# Patient Record
Sex: Male | Born: 1935 | Race: White | Hispanic: No | Marital: Married | Smoking: Former smoker
Health system: Southern US, Community
[De-identification: ages and names within clinical notes are randomized; demographics above are authoritative.]

## PROBLEM LIST (undated history)

## (undated) DIAGNOSIS — K219 Gastro-esophageal reflux disease without esophagitis: Secondary | ICD-10-CM

## (undated) DIAGNOSIS — I5189 Other ill-defined heart diseases: Secondary | ICD-10-CM

## (undated) DIAGNOSIS — R0602 Shortness of breath: Secondary | ICD-10-CM

## (undated) DIAGNOSIS — K449 Diaphragmatic hernia without obstruction or gangrene: Secondary | ICD-10-CM

## (undated) DIAGNOSIS — I2510 Atherosclerotic heart disease of native coronary artery without angina pectoris: Secondary | ICD-10-CM

## (undated) DIAGNOSIS — E230 Hypopituitarism: Secondary | ICD-10-CM

## (undated) DIAGNOSIS — E039 Hypothyroidism, unspecified: Secondary | ICD-10-CM

## (undated) DIAGNOSIS — I509 Heart failure, unspecified: Secondary | ICD-10-CM

## (undated) DIAGNOSIS — M1990 Unspecified osteoarthritis, unspecified site: Secondary | ICD-10-CM

## (undated) DIAGNOSIS — K639 Disease of intestine, unspecified: Secondary | ICD-10-CM

## (undated) DIAGNOSIS — E6609 Other obesity due to excess calories: Secondary | ICD-10-CM

## (undated) DIAGNOSIS — K5792 Diverticulitis of intestine, part unspecified, without perforation or abscess without bleeding: Secondary | ICD-10-CM

## (undated) DIAGNOSIS — Z87898 Personal history of other specified conditions: Secondary | ICD-10-CM

## (undated) DIAGNOSIS — D696 Thrombocytopenia, unspecified: Secondary | ICD-10-CM

## (undated) DIAGNOSIS — N201 Calculus of ureter: Secondary | ICD-10-CM

## (undated) DIAGNOSIS — Z86010 Personal history of colonic polyps: Secondary | ICD-10-CM

## (undated) DIAGNOSIS — K760 Fatty (change of) liver, not elsewhere classified: Secondary | ICD-10-CM

## (undated) DIAGNOSIS — N200 Calculus of kidney: Secondary | ICD-10-CM

## (undated) HISTORY — DX: Unspecified osteoarthritis, unspecified site: M19.90

## (undated) HISTORY — DX: Personal history of colonic polyps: Z86.010

## (undated) HISTORY — DX: Calculus of kidney: N20.0

## (undated) HISTORY — DX: Heart failure, unspecified: I50.9

## (undated) HISTORY — DX: Other obesity due to excess calories: E66.09

## (undated) HISTORY — DX: Diverticulitis of intestine, part unspecified, without perforation or abscess without bleeding: K57.92

## (undated) HISTORY — DX: Atherosclerotic heart disease of native coronary artery without angina pectoris: I25.10

## (undated) HISTORY — DX: Diaphragmatic hernia without obstruction or gangrene: K44.9

## (undated) HISTORY — DX: Hypothyroidism, unspecified: E03.9

## (undated) HISTORY — DX: Personal history of other specified conditions: Z87.898

## (undated) HISTORY — DX: Gastro-esophageal reflux disease without esophagitis: K21.9

## (undated) HISTORY — DX: Disease of intestine, unspecified: K63.9

## (undated) HISTORY — DX: Fatty (change of) liver, not elsewhere classified: K76.0

## (undated) HISTORY — DX: Hypopituitarism: E23.0

## (undated) HISTORY — DX: Other ill-defined heart diseases: I51.89

## (undated) HISTORY — DX: Calculus of ureter: N20.1

---

## 1997-10-02 ENCOUNTER — Inpatient Hospital Stay (HOSPITAL_COMMUNITY): Admission: RE | Admit: 1997-10-02 | Discharge: 1997-10-14 | Payer: Self-pay | Admitting: Neurosurgery

## 1997-10-02 ENCOUNTER — Encounter: Payer: Self-pay | Admitting: Neurosurgery

## 1997-10-03 ENCOUNTER — Encounter: Payer: Self-pay | Admitting: Neurosurgery

## 1997-10-04 ENCOUNTER — Encounter: Payer: Self-pay | Admitting: Neurosurgery

## 1997-10-05 ENCOUNTER — Encounter: Payer: Self-pay | Admitting: Neurosurgery

## 1997-10-07 ENCOUNTER — Encounter: Payer: Self-pay | Admitting: Neurosurgery

## 1997-10-08 ENCOUNTER — Encounter: Payer: Self-pay | Admitting: Neurosurgery

## 1997-10-09 ENCOUNTER — Encounter: Payer: Self-pay | Admitting: Neurosurgery

## 1997-11-01 ENCOUNTER — Ambulatory Visit (HOSPITAL_COMMUNITY): Admission: RE | Admit: 1997-11-01 | Discharge: 1997-11-01 | Payer: Self-pay | Admitting: Internal Medicine

## 1997-11-13 ENCOUNTER — Ambulatory Visit (HOSPITAL_COMMUNITY): Admission: RE | Admit: 1997-11-13 | Discharge: 1997-11-13 | Payer: Self-pay | Admitting: Internal Medicine

## 1997-11-13 ENCOUNTER — Encounter (HOSPITAL_BASED_OUTPATIENT_CLINIC_OR_DEPARTMENT_OTHER): Payer: Self-pay | Admitting: Internal Medicine

## 1997-12-16 ENCOUNTER — Ambulatory Visit (HOSPITAL_COMMUNITY): Admission: RE | Admit: 1997-12-16 | Discharge: 1997-12-16 | Payer: Self-pay | Admitting: Internal Medicine

## 1997-12-16 ENCOUNTER — Encounter (HOSPITAL_BASED_OUTPATIENT_CLINIC_OR_DEPARTMENT_OTHER): Payer: Self-pay | Admitting: Internal Medicine

## 1998-01-13 ENCOUNTER — Ambulatory Visit (HOSPITAL_COMMUNITY): Admission: RE | Admit: 1998-01-13 | Discharge: 1998-01-13 | Payer: Self-pay | Admitting: Neurosurgery

## 1998-01-13 ENCOUNTER — Encounter: Payer: Self-pay | Admitting: Neurosurgery

## 1998-02-17 ENCOUNTER — Ambulatory Visit (HOSPITAL_COMMUNITY): Admission: RE | Admit: 1998-02-17 | Discharge: 1998-02-17 | Payer: Self-pay | Admitting: Internal Medicine

## 1998-03-10 ENCOUNTER — Encounter: Payer: Self-pay | Admitting: Thoracic Surgery

## 1998-03-10 ENCOUNTER — Inpatient Hospital Stay (HOSPITAL_COMMUNITY): Admission: RE | Admit: 1998-03-10 | Discharge: 1998-03-17 | Payer: Self-pay | Admitting: Thoracic Surgery

## 1998-03-11 ENCOUNTER — Encounter: Payer: Self-pay | Admitting: Thoracic Surgery

## 1998-03-12 ENCOUNTER — Encounter: Payer: Self-pay | Admitting: Thoracic Surgery

## 1998-03-13 ENCOUNTER — Encounter: Payer: Self-pay | Admitting: Thoracic Surgery

## 1998-03-14 ENCOUNTER — Encounter: Payer: Self-pay | Admitting: Thoracic Surgery

## 1998-03-15 ENCOUNTER — Encounter: Payer: Self-pay | Admitting: Thoracic Surgery

## 1998-03-17 ENCOUNTER — Encounter: Payer: Self-pay | Admitting: Thoracic Surgery

## 1998-03-21 ENCOUNTER — Encounter: Payer: Self-pay | Admitting: Emergency Medicine

## 1998-03-21 ENCOUNTER — Emergency Department (HOSPITAL_COMMUNITY): Admission: EM | Admit: 1998-03-21 | Discharge: 1998-03-21 | Payer: Self-pay | Admitting: Emergency Medicine

## 1998-05-23 ENCOUNTER — Encounter (HOSPITAL_BASED_OUTPATIENT_CLINIC_OR_DEPARTMENT_OTHER): Payer: Self-pay | Admitting: Internal Medicine

## 1998-05-23 ENCOUNTER — Inpatient Hospital Stay: Admission: EM | Admit: 1998-05-23 | Discharge: 1998-05-27 | Payer: Self-pay | Admitting: Internal Medicine

## 1998-05-25 ENCOUNTER — Encounter: Payer: Self-pay | Admitting: Endocrinology

## 1998-10-07 ENCOUNTER — Emergency Department (HOSPITAL_COMMUNITY): Admission: EM | Admit: 1998-10-07 | Discharge: 1998-10-07 | Payer: Self-pay | Admitting: Emergency Medicine

## 1998-10-07 ENCOUNTER — Encounter: Payer: Self-pay | Admitting: Emergency Medicine

## 1999-01-06 ENCOUNTER — Ambulatory Visit (HOSPITAL_COMMUNITY): Admission: RE | Admit: 1999-01-06 | Discharge: 1999-01-06 | Payer: Self-pay | Admitting: Neurosurgery

## 1999-01-06 ENCOUNTER — Encounter: Payer: Self-pay | Admitting: Neurosurgery

## 1999-01-12 HISTORY — PX: TRANSPHENOIDAL PITUITARY RESECTION: SHX2572

## 1999-01-27 ENCOUNTER — Encounter: Admission: RE | Admit: 1999-01-27 | Discharge: 1999-04-27 | Payer: Self-pay | Admitting: Radiation Oncology

## 1999-04-27 ENCOUNTER — Other Ambulatory Visit: Admission: RE | Admit: 1999-04-27 | Discharge: 1999-04-27 | Payer: Self-pay | Admitting: Gastroenterology

## 1999-04-27 ENCOUNTER — Encounter (INDEPENDENT_AMBULATORY_CARE_PROVIDER_SITE_OTHER): Payer: Self-pay

## 2000-01-19 ENCOUNTER — Ambulatory Visit (HOSPITAL_COMMUNITY): Admission: RE | Admit: 2000-01-19 | Discharge: 2000-01-19 | Payer: Self-pay | Admitting: Neurosurgery

## 2000-01-19 ENCOUNTER — Encounter: Payer: Self-pay | Admitting: Neurosurgery

## 2001-01-27 ENCOUNTER — Ambulatory Visit (HOSPITAL_COMMUNITY): Admission: RE | Admit: 2001-01-27 | Discharge: 2001-01-27 | Payer: Self-pay | Admitting: Neurosurgery

## 2001-01-27 ENCOUNTER — Encounter: Payer: Self-pay | Admitting: Neurosurgery

## 2001-12-04 ENCOUNTER — Emergency Department (HOSPITAL_COMMUNITY): Admission: EM | Admit: 2001-12-04 | Discharge: 2001-12-05 | Payer: Self-pay

## 2001-12-04 ENCOUNTER — Encounter: Payer: Self-pay | Admitting: Emergency Medicine

## 2002-01-11 HISTORY — PX: CHOLECYSTECTOMY, LAPAROSCOPIC: SHX56

## 2002-06-11 ENCOUNTER — Emergency Department (HOSPITAL_COMMUNITY): Admission: EM | Admit: 2002-06-11 | Discharge: 2002-06-11 | Payer: Self-pay | Admitting: Emergency Medicine

## 2002-06-11 ENCOUNTER — Encounter: Payer: Self-pay | Admitting: Emergency Medicine

## 2003-04-23 ENCOUNTER — Ambulatory Visit (HOSPITAL_COMMUNITY): Admission: RE | Admit: 2003-04-23 | Discharge: 2003-04-23 | Payer: Self-pay | Admitting: Internal Medicine

## 2005-06-09 ENCOUNTER — Ambulatory Visit: Payer: Self-pay | Admitting: Gastroenterology

## 2005-06-22 ENCOUNTER — Encounter (INDEPENDENT_AMBULATORY_CARE_PROVIDER_SITE_OTHER): Payer: Self-pay | Admitting: *Deleted

## 2005-06-22 ENCOUNTER — Ambulatory Visit: Payer: Self-pay | Admitting: Gastroenterology

## 2005-06-22 DIAGNOSIS — K648 Other hemorrhoids: Secondary | ICD-10-CM | POA: Insufficient documentation

## 2005-06-22 DIAGNOSIS — D126 Benign neoplasm of colon, unspecified: Secondary | ICD-10-CM

## 2005-06-22 DIAGNOSIS — K5730 Diverticulosis of large intestine without perforation or abscess without bleeding: Secondary | ICD-10-CM | POA: Insufficient documentation

## 2006-12-12 HISTORY — PX: ESOPHAGEAL DILATION: SHX303

## 2006-12-23 ENCOUNTER — Encounter: Payer: Self-pay | Admitting: Gastroenterology

## 2006-12-23 ENCOUNTER — Ambulatory Visit: Payer: Self-pay | Admitting: Gastroenterology

## 2007-01-09 DIAGNOSIS — I428 Other cardiomyopathies: Secondary | ICD-10-CM | POA: Insufficient documentation

## 2007-01-09 DIAGNOSIS — E232 Diabetes insipidus: Secondary | ICD-10-CM

## 2007-01-09 DIAGNOSIS — E230 Hypopituitarism: Secondary | ICD-10-CM | POA: Insufficient documentation

## 2007-01-09 DIAGNOSIS — M129 Arthropathy, unspecified: Secondary | ICD-10-CM | POA: Insufficient documentation

## 2007-01-09 DIAGNOSIS — I509 Heart failure, unspecified: Secondary | ICD-10-CM | POA: Insufficient documentation

## 2008-05-06 ENCOUNTER — Encounter (INDEPENDENT_AMBULATORY_CARE_PROVIDER_SITE_OTHER): Payer: Self-pay | Admitting: *Deleted

## 2008-05-31 ENCOUNTER — Encounter: Payer: Self-pay | Admitting: Gastroenterology

## 2008-06-07 ENCOUNTER — Encounter: Payer: Self-pay | Admitting: Gastroenterology

## 2008-07-02 ENCOUNTER — Ambulatory Visit: Payer: Self-pay | Admitting: Gastroenterology

## 2008-07-02 DIAGNOSIS — R197 Diarrhea, unspecified: Secondary | ICD-10-CM | POA: Insufficient documentation

## 2008-07-02 DIAGNOSIS — R195 Other fecal abnormalities: Secondary | ICD-10-CM

## 2008-07-04 ENCOUNTER — Telehealth: Payer: Self-pay | Admitting: Gastroenterology

## 2008-07-09 ENCOUNTER — Encounter: Admission: RE | Admit: 2008-07-09 | Discharge: 2008-07-09 | Payer: Self-pay | Admitting: Internal Medicine

## 2008-07-22 ENCOUNTER — Encounter: Payer: Self-pay | Admitting: Gastroenterology

## 2008-07-22 ENCOUNTER — Ambulatory Visit: Payer: Self-pay | Admitting: Gastroenterology

## 2008-07-23 DIAGNOSIS — Z860100 Personal history of colon polyps, unspecified: Secondary | ICD-10-CM

## 2008-07-23 HISTORY — DX: Personal history of colonic polyps: Z86.010

## 2008-07-23 HISTORY — DX: Personal history of colon polyps, unspecified: Z86.0100

## 2008-07-24 ENCOUNTER — Encounter: Payer: Self-pay | Admitting: Gastroenterology

## 2008-08-18 ENCOUNTER — Emergency Department (HOSPITAL_COMMUNITY): Admission: EM | Admit: 2008-08-18 | Discharge: 2008-08-18 | Payer: Self-pay | Admitting: Emergency Medicine

## 2009-01-05 ENCOUNTER — Inpatient Hospital Stay (HOSPITAL_COMMUNITY): Admission: EM | Admit: 2009-01-05 | Discharge: 2009-01-07 | Payer: Self-pay | Admitting: Emergency Medicine

## 2009-01-23 ENCOUNTER — Telehealth: Payer: Self-pay | Admitting: Gastroenterology

## 2009-01-28 ENCOUNTER — Ambulatory Visit: Payer: Self-pay | Admitting: Gastroenterology

## 2009-01-28 DIAGNOSIS — R112 Nausea with vomiting, unspecified: Secondary | ICD-10-CM

## 2009-01-28 DIAGNOSIS — R51 Headache: Secondary | ICD-10-CM

## 2009-01-28 DIAGNOSIS — R634 Abnormal weight loss: Secondary | ICD-10-CM

## 2009-01-28 DIAGNOSIS — R519 Headache, unspecified: Secondary | ICD-10-CM | POA: Insufficient documentation

## 2009-01-28 LAB — CONVERTED CEMR LAB
ALT: 30 units/L (ref 0–53)
AST: 34 units/L (ref 0–37)
Albumin: 4 g/dL (ref 3.5–5.2)
Amylase: 77 units/L (ref 27–131)
BUN: 15 mg/dL (ref 6–23)
Basophils Relative: 2.7 % (ref 0.0–3.0)
CRP, High Sensitivity: 6.6 — ABNORMAL HIGH (ref 0.00–5.00)
Chloride: 109 meq/L (ref 96–112)
Creatinine, Ser: 1.1 mg/dL (ref 0.4–1.5)
Eosinophils Absolute: 0.2 10*3/uL (ref 0.0–0.7)
Eosinophils Relative: 3.1 % (ref 0.0–5.0)
Glucose, Bld: 80 mg/dL (ref 70–99)
Hemoglobin: 14.8 g/dL (ref 13.0–17.0)
Lipase: 22 units/L (ref 11.0–59.0)
Lymphocytes Relative: 18.9 % (ref 12.0–46.0)
MCHC: 32.6 g/dL (ref 30.0–36.0)
MCV: 95 fL (ref 78.0–100.0)
Neutro Abs: 5.3 10*3/uL (ref 1.4–7.7)
Neutrophils Relative %: 71.9 % (ref 43.0–77.0)
Potassium: 4.2 meq/L (ref 3.5–5.1)
RBC: 4.78 M/uL (ref 4.22–5.81)
Sed Rate: 8 mm/hr (ref 0–22)
TSH: 0.09 microintl units/mL — ABNORMAL LOW (ref 0.35–5.50)
Total Protein: 7.4 g/dL (ref 6.0–8.3)
Vitamin B-12: 696 pg/mL (ref 211–911)
WBC: 7.4 10*3/uL (ref 4.5–10.5)

## 2009-02-03 ENCOUNTER — Telehealth: Payer: Self-pay | Admitting: Gastroenterology

## 2009-02-03 DIAGNOSIS — R109 Unspecified abdominal pain: Secondary | ICD-10-CM | POA: Insufficient documentation

## 2009-02-05 ENCOUNTER — Ambulatory Visit (HOSPITAL_COMMUNITY): Admission: RE | Admit: 2009-02-05 | Discharge: 2009-02-05 | Payer: Self-pay | Admitting: Gastroenterology

## 2009-02-10 ENCOUNTER — Telehealth: Payer: Self-pay | Admitting: Gastroenterology

## 2009-02-14 ENCOUNTER — Ambulatory Visit: Payer: Self-pay | Admitting: Internal Medicine

## 2009-03-18 ENCOUNTER — Ambulatory Visit: Payer: Self-pay | Admitting: Gastroenterology

## 2009-11-10 ENCOUNTER — Ambulatory Visit: Payer: Self-pay | Admitting: Cardiology

## 2010-02-01 ENCOUNTER — Encounter: Payer: Self-pay | Admitting: Gastroenterology

## 2010-02-10 NOTE — Progress Notes (Signed)
Summary: Triage   Phone Note Call from Patient Call back at Home Phone [phone]   Caller: Dione Housekeeper Call For: Dr. Jarold Motto Reason for Call: Talk to Nurse Summary of Call: Pt. is belching and nauseated also losing weight. Pt. has an appt. scheduled 01-31-09 Initial call taken by: Karna Christmas,  January 23, 2009 10:24 AM  Follow-up for Phone Call        Talked with pt.  States he has beenhaving trouble for 2-3 weeks.  was in hospital for two days (12/26-12/28)  COntinuse to have abd pain and nausea.  Was told to follow up with  Dr. Sheryn Bison.   Lupita Leash Surface RN  January 23, 2009 12:44 PM  Left message for pt to call.  Appt sch for 01/28/09 at 8:45 with Dr. Jarold Motto. Follow-up by: Ashok Cordia RN,  January 23, 2009 3:35 PM  Additional Follow-up for Phone Call Additional follow up Details #1::        Pt notified of appt and can. fee. Additional Follow-up by: Ashok Cordia RN,  January 24, 2009 9:29 AM

## 2010-02-10 NOTE — Progress Notes (Signed)
Summary: CT scan denial   Phone Note Outgoing Call   Summary of Call: Insurance company has deined payment for CT scan.  The scan is scheduled for Tuesday 02/04/09.  Reason is that not enough documentation that pt needs test.  No recent negative colonoscopy etc.   See last OV note.   Initial call taken by: Ashok Cordia RN,  February 03, 2009 8:44 AM  Follow-up for Phone Call        ultrasound is ok... Follow-up by: Mardella Layman MD FACG,  February 03, 2009 10:57 AM  Additional Follow-up for Phone Call Additional follow up Details #1::        Abd Ultrasound scheduled for 02/05/09 at 9:00 at [facility].  Pt needs to arrive at 8:45 and NPo after MN.  Pt notified.    Additional Follow-up by: Ashok Cordia RN,  February 03, 2009 11:14 AM  New Problems: ABDOMINAL PAIN, UNSPECIFIED SITE (ICD-789.00)   New Problems: ABDOMINAL PAIN, UNSPECIFIED SITE (ICD-789.00)

## 2010-02-10 NOTE — Assessment & Plan Note (Signed)
Summary: follow up/ 1 mo/ dfs    History of Present Illness Visit Type: Follow-up Visit Primary GI MD: Sheryn Bison MD FACP FAGA Primary Provider: Jarome Matin, MD Requesting Provider: n/a Chief Complaint: One month f/u for nausea, vomiting, and abd pain. Pt states that he is better and denies any GI complaints  History of Present Illness:   He had an extensive GI workup including ultrasound, labs, and CT scan all of which were unremarkable. He currently is asymptomatic and continues on omeprazole 20 mg a day. His appetite is good, his weight is stable, and his nausea has abated. He denies any hepatobiliary complaints or lower bowel problems.   GI Review of Systems      Denies abdominal pain, acid reflux, belching, bloating, chest pain, dysphagia with liquids, dysphagia with solids, heartburn, loss of appetite, nausea, vomiting, vomiting blood, weight loss, and  weight gain.        Denies anal fissure, black tarry stools, change in bowel habit, constipation, diarrhea, diverticulosis, fecal incontinence, heme positive stool, hemorrhoids, irritable bowel syndrome, jaundice, light color stool, liver problems, rectal bleeding, and  rectal pain.    Current Medications (verified): 1)  Levothyroxine Sodium 25 Mcg Tabs (Levothyroxine Sodium) .... Take One By Mouth Once Daily 2)  Allopurinol 100 Mg Tabs (Allopurinol) .... Take One By Mouth Once Daily 3)  Furosemide 40 Mg Tabs (Furosemide) .... Take One By Mouth Four Times A Day 4)  Aspirin 81 Mg Tbec (Aspirin) .... Take One By Mouth Once Daily 5)  Klor-Con 20 Meq Pack (Potassium Chloride) .... One Tablet By Mouth Two Times A Day 6)  Omeprazole 20 Mg Cpdr (Omeprazole) .... Two Tablets By Mouth Once Daily 7)  Atenolol 25 Mg Tabs (Atenolol) .... One Tablet By Mouth Once Daily 8)  Hydrocortisone 10 Mg Tabs (Hydrocortisone) .... Take 2 in The Am and 1/2 Tab At Night  Allergies (verified): 1)  ! Penicillin  Past History:  Past medical,  surgical, family and social histories (including risk factors) reviewed for relevance to current acute and chronic problems.  Past Medical History: Reviewed history from 07/02/2008 and no changes required. 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 pituitary tumor removal x2001 Cholecystectomy  Family History: Reviewed history from 01/28/2009 and no changes required. Family History of Colon Cancer:Brother  Sister had cancer dont remember which one  Family History of Liver Cancer: Brother  Social History: Reviewed history from 01/28/2009 and no changes required. Occupation: Retired  Married  Patient is a former smoker. -stopped stopped over 50 years ago Alcohol Use - no Illicit Drug Use - no Daily Caffeine Use: cup of coffee occasionally Patient gets regular exercise.  Review of Systems       The patient complains of arthritis/joint pain and back pain.  The patient denies allergy/sinus, anemia, anxiety-new, blood in urine, breast changes/lumps, change in vision, confusion, cough, coughing up blood, depression-new, fainting, fatigue, fever, headaches-new, hearing problems, heart murmur, heart rhythm changes, itching, muscle pains/cramps, night sweats, nosebleeds, shortness of breath, skin rash, sleeping problems, sore throat, swelling of feet/legs, swollen lymph glands, thirst - excessive, urination - excessive, urination changes/pain, urine leakage, vision changes, and voice change.    Vital Signs:  Patient profile:   77 year old male Height:  66 inches Weight:      218 pounds BMI:     35.31 BSA:      2.08 Pulse rate:   62 / minute Pulse rhythm:   regular BP sitting:   120 / 72  (left arm) Cuff size:   regular  Vitals Entered By: Ok Anis CMA (March 18, 2009 8:44 AM)  Physical Exam  General:  Well developed, well nourished, no acute distress.healthy appearing.   Head:  Normocephalic and atraumatic. Eyes:  PERRLA, no icterus.exam deferred to patient's ophthalmologist.   Lungs:  Clear throughout to auscultation. Heart:  Regular rate and rhythm; no murmurs, rubs,  or bruits. Abdomen:  Diastasis rectus ventral abdominal wall hernia noted. Otherwise no organomegaly, masses, tenderness, or abnormal bowel sounds. I cannot appreciate an abdominal bruit. Extremities:  No clubbing, cyanosis, edema or deformities noted.trace pedal edema.   Neurologic:  Alert and  oriented x4;  grossly normal neurologically. Psych:  Alert and cooperative. Normal mood and affect.   Impression & Recommendations:  Problem # 1:  ABDOMINAL PAIN, UNSPECIFIED SITE (ICD-789.00) Assessment Improved His acute GI problems have resolved and he is asymptomatic at this time. Have encouraged him to continue omeprazole and all his other medications per Dr. Jarome Matin and Dr. Cassell Clement in cardiology.  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 Jarome Matin and Dr. Cassell Clement 2)  Please continue current medications.  3)  The medication list was reviewed and reconciled.  All changed / newly prescribed medications were explained.  A complete medication list was provided to the patient / caregiver.

## 2010-02-10 NOTE — Progress Notes (Signed)
Summary: CT scan   Phone Note Outgoing Call   Call placed by: Ashok Cordia RN,  February 10, 2009 2:17 PM Summary of Call: See append on [place] reoprt.  Ct scan has now been approved by insurance.  Pt states he was just in the hospital and had some xrays done.  Wants to make sure that a CT scan was not done.  According to hosp recs, Small bowel and MRI of head were only Xrays done while pt was in the hospital.   Attemped to call pt, no answer. Initial call taken by: Ashok Cordia RN,  February 10, 2009 2:20 PM  Follow-up for Phone Call        Pt called.  He is willing to have CT scan.  Will call when scheduled. Lupita Leash Surface RN  February 11, 2009 10:17 AM  CT sch for Friday 2/4/11at 9:00  LM for pt to call. Lupita Leash Surface RN  February 11, 2009 2:06 PM  Pt notified of appt.  New order created. Follow-up by: Ashok Cordia RN,  February 11, 2009 4:06 PM

## 2010-02-10 NOTE — Assessment & Plan Note (Signed)
Summary: Abd pain, nausea/dfs    History of Present Illness Visit Type: Follow-up Visit Primary GI MD: Sheryn Bison MD FACP FAGA Primary Provider: Jarome Matin, MD Requesting Provider: Jarome Matin, MD Chief Complaint: Patient c/o 1 month intermittent nausea and vomiting not related to food. He denies any abdominal pain or lower GI symptoms. History of Present Illness:   Scott Mooney is a very pleasant 77 year old white male status post resection of a pituitary tumor 10 years ago and is managed by Dr. Ivery Quale for his endocrine problems. He also has a chronic ischemic cardiomyopathy and is under  the care of Dr. Cassell Clement. He recently was hospitalized in December for nausea and headache and had a negative neurologic workup including MRI of the brain, upper GI and small bowel series, and laboratory data.  He continues with nausea, anorexia, and weight loss. 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 He is status post cholecystectomy and denies any hepatobiliary complaints. He's had colonoscopy on multiple occasions with removal of benign polyps,and denies melena, hematochezia, constipation, or lower abdominal pain. He gives no history of jaundice, clay colored stools, dark urine, fever, chills, or systemic complaints. Medications were reviewed and he is on omeprazole 40 mg a day. Isorbid has recently been discontinued. He denies any significant current cardiovascular or pulmonary complaints. His exercise tolerance is adequate, and he denies cough, sputum production or hemoptysis.   GI Review of Systems    Reports belching, loss of appetite, nausea, vomiting, and  weight loss.   Weight loss of 15 pounds over 1 month.   Denies abdominal pain, acid reflux, bloating, chest pain, dysphagia with liquids, dysphagia with solids, heartburn, vomiting blood, and  weight gain.        Denies anal fissure, black tarry  stools, change in bowel habit, constipation, diarrhea, diverticulosis, fecal incontinence, heme positive stool, hemorrhoids, irritable bowel syndrome, jaundice, light color stool, liver problems, rectal bleeding, and  rectal pain. Preventive Screening-Counseling & Management  Caffeine-Diet-Exercise     Does Patient Exercise: yes    Current Medications (verified): 1)  Levothyroxine Sodium 25 Mcg Tabs (Levothyroxine Sodium) .... Take One By Mouth Once Daily 2)  Allopurinol 100 Mg Tabs (Allopurinol) .... Take One By Mouth Once Daily 3)  Furosemide 40 Mg Tabs (Furosemide) .... Take One By Mouth Four Times A Day 4)  Aspirin 81 Mg Tbec (Aspirin) .... Take One By Mouth Once Daily 5)  Klor-Con 20 Meq Pack (Potassium Chloride) .... One Tablet By Mouth Two Times A Day 6)  Omeprazole 20 Mg Cpdr (Omeprazole) .... Two Tablets By Mouth Once Daily 7)  Atenolol 25 Mg Tabs (Atenolol) .... One Tablet By Mouth Once Daily 8)  Hydrocortisone 10 Mg Tabs (Hydrocortisone) .... Take 2 in The Am and 1/2 Tab At Night  Allergies (verified): 1)  ! Penicillin  Past History:  Past medical, surgical, family and social histories (including risk factors) reviewed for relevance to current acute and chronic problems.  Past Medical History: Reviewed history from 07/02/2008 and no changes required. 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 pituitary tumor removal x2001 Cholecystectomy  Family History: Reviewed history from 07/02/2008 and no changes required. Family History of Colon Cancer:Brother  Sister had cancer  dont remember which one    Family History of Liver Cancer: Brother  Social History: Reviewed history from 07/02/2008 and no changes required. Occupation: Retired  Married  Patient is a former smoker. -stopped stopped over 50 years ago Alcohol Use - no Illicit Drug Use - no Daily Caffeine Use: cup of coffee occasionally Patient gets regular exercise. Does Patient Exercise:  yes  Review of Systems       The patient complains of fatigue, headaches-new, shortness of breath, and swelling of feet/legs.  The patient denies allergy/sinus, anemia, anxiety-new, arthritis/joint pain, back pain, blood in urine, breast changes/lumps, change in vision, confusion, cough, coughing up blood, depression-new, fainting, fever, hearing problems, heart murmur, heart rhythm changes, itching, menstrual pain, muscle pains/cramps, night sweats, nosebleeds, pregnancy symptoms, skin rash, sleeping problems, sore throat, swollen lymph glands, thirst - excessive , urination - excessive , urination changes/pain, urine leakage, vision changes, and voice change.    Vital Signs:  Patient profile:   77 year old male Height:      66 inches Weight:      212.13 pounds BMI:     34.36 BSA:     2.05 Pulse rate:   56 / minute Pulse rhythm:   regular BP sitting:   124 / 60  (left arm)  Vitals Entered By: Hortense Ramal CMA Duncan Dull) (January 28, 2009 8:45 AM)  Physical Exam  General:  Well developed, well nourished, no acute distress.He appears oriented his stated age, pale, chronically ill. Head:  Normocephalic and atraumatic. Eyes:  PERRLA, no icterus.exam deferred to patient's ophthalmologist.   Neck:  Supple; no masses or thyromegaly.No and large lymph nodes are noted with thyromegaly. Chest Wall:  Symmetrical,  no deformities . Lungs:  Clear throughout to auscultation. Heart:  Regular rate and rhythm; no murmurs, rubs,  or bruits. Abdomen:  Soft, nontender and nondistended. No masses, hepatosplenomegaly or hernias noted. Normal bowel  sounds. Rectal:  deferred Msk:  Symmetrical with no gross deformities. Normal posture. Pulses:  Normal pulses noted. Extremities:  trace pedal edema.   Neurologic:  Alert and  oriented x4;  grossly normal neurologically. Cervical Nodes:  No significant cervical adenopathy. Axillary Nodes:  No significant axillary adenopathy. Inguinal Nodes:  No significant inguinal adenopathy. Psych:  Alert and cooperative. Normal mood and affect.   Impression & Recommendations:  Problem # 1:  WEIGHT LOSS-ABNORMAL (ICD-783.21) Assessment New  His symptoms are very nonspecific and mostly to me he complains of nausea and lack of appetite without true dysphasia, emesis of food, or abdominal pain. He has multiple medical problems which appear fairly stable at this time. 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 Her concern that he may have occult malignancy and scheduled repeat lab test and CT scan of the abdomen and pelvis. Discontinue all of his other medications for his pituitary insufficiency as per expert management by Dr. Georga Kaufmann and his cardiomyopathy per the care of Dr. Patty Sermons. I will continue omeprazole 40 mg a day.  Orders: CT Abdomen/Pelvis with Contrast (CT Abd/Pelvis w/con)  Problem # 2:  HEADACHE (ICD-784.0) Recent MRI showed no gross neurological lesions. I cannot appreciate carotid bruits or gross focal neurological deficits. Orders: TLB-CBC Platelet - w/Differential (85025-CBCD) TLB-BMP (Basic Metabolic Panel-BMET) (80048-METABOL) TLB-Hepatic/Liver Function Pnl (80076-HEPATIC) TLB-TSH (Thyroid Stimulating Hormone) (84443-TSH) TLB-B12, Serum-Total ONLY (72536-U44) TLB-Ferritin (82728-FER) TLB-Folic Acid (Folate) (82746-FOL)  TLB-IBC Pnl (Iron/FE;Transferrin) (83550-IBC) TLB-CRP-High Sensitivity (C-Reactive Protein)  (86140-FCRP) TLB-Amylase (82150-AMYL) TLB-Lipase (83690-LIPASE) TLB-Sedimentation Rate (ESR) (85652-ESR)  Problem # 3:  ADENOCARCINOMA, COLON, FAMILY HX (ICD-V16.0) Assessment: Unchanged Continue colonoscopy followup as per clinical protocol  Problem # 4:  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 Eloise Harman.  Problem # 5:  CARDIOMYOPATHY (ICD-425.4) Assessment: Improved He is on daily aspirin but is not on Plavix or Coumadin. He appears to be a regular rhythm at this time and does not have any evidence of significant congestive heart failure. Blood pressure today is 124/60 and pulse was 56 and regular on atenolol 25 mg twice a day.  Patient Instructions: 1)  Copy sent to : Dr. Jarome Matin and Dr. Cassell Clement 2)  Labs pending 3)  CT Scan of abdomen and pelvis 4)  Please continue current medications.  5)  The medication list was reviewed and reconciled.  All changed / newly prescribed medications were explained.  A complete medication list was provided to the patient / caregiver.  Appended Document: Abd pain, nausea/dfs GI workup so far is unremarkable. Please call him and tell him to continue current meds and to see me in one month's time.

## 2010-04-13 LAB — CBC
HCT: 46 % (ref 39.0–52.0)
Hemoglobin: 12.9 g/dL — ABNORMAL LOW (ref 13.0–17.0)
MCHC: 33.8 g/dL (ref 30.0–36.0)
MCV: 94.7 fL (ref 78.0–100.0)
Platelets: 118 10*3/uL — ABNORMAL LOW (ref 150–400)
Platelets: 168 10*3/uL (ref 150–400)
RBC: 4.01 MIL/uL — ABNORMAL LOW (ref 4.22–5.81)
RDW: 15.5 % (ref 11.5–15.5)
RDW: 15.6 % — ABNORMAL HIGH (ref 11.5–15.5)
WBC: 5.1 10*3/uL (ref 4.0–10.5)

## 2010-04-13 LAB — URINALYSIS, ROUTINE W REFLEX MICROSCOPIC
Glucose, UA: NEGATIVE mg/dL
Ketones, ur: NEGATIVE mg/dL
Nitrite: NEGATIVE
Protein, ur: NEGATIVE mg/dL
Urobilinogen, UA: 0.2 mg/dL (ref 0.0–1.0)

## 2010-04-13 LAB — DIFFERENTIAL
Lymphocytes Relative: 25 % (ref 12–46)
Monocytes Absolute: 0.3 10*3/uL (ref 0.1–1.0)
Monocytes Relative: 5 % (ref 3–12)
Neutro Abs: 4 10*3/uL (ref 1.7–7.7)

## 2010-04-13 LAB — COMPREHENSIVE METABOLIC PANEL
AST: 50 U/L — ABNORMAL HIGH (ref 0–37)
Albumin: 3.6 g/dL (ref 3.5–5.2)
Alkaline Phosphatase: 87 U/L (ref 39–117)
BUN: 11 mg/dL (ref 6–23)
GFR calc Af Amer: >60 mL/min (ref >60)
Potassium: 4 mEq/L (ref 3.5–5.1)
Total Protein: 7.1 g/dL (ref 6.0–8.3)

## 2010-04-13 LAB — GLUCOSE, CAPILLARY
Glucose-Capillary: 132 mg/dL — ABNORMAL HIGH (ref 70–99)
Glucose-Capillary: 140 mg/dL — ABNORMAL HIGH (ref 70–99)
Glucose-Capillary: 145 mg/dL — ABNORMAL HIGH (ref 70–99)

## 2010-04-13 LAB — URINE CULTURE

## 2010-04-19 LAB — DIFFERENTIAL
Basophils Relative: 0 % (ref 0–1)
Eosinophils Absolute: 0.1 10*3/uL (ref 0.0–0.7)
Eosinophils Relative: 1 % (ref 0–5)
Lymphs Abs: 0.7 10*3/uL (ref 0.7–4.0)
Monocytes Absolute: 0.3 10*3/uL (ref 0.1–1.0)
Monocytes Relative: 5 % (ref 3–12)

## 2010-04-19 LAB — URINALYSIS, ROUTINE W REFLEX MICROSCOPIC
Bilirubin Urine: NEGATIVE
Nitrite: NEGATIVE
Specific Gravity, Urine: 1.012 (ref 1.005–1.030)
Urobilinogen, UA: 0.2 mg/dL (ref 0.0–1.0)
pH: 7 (ref 5.0–8.0)

## 2010-04-19 LAB — COMPREHENSIVE METABOLIC PANEL
ALT: 46 U/L (ref 0–53)
AST: 55 U/L — ABNORMAL HIGH (ref 0–37)
Alkaline Phosphatase: 110 U/L (ref 39–117)
CO2: 22 mEq/L (ref 19–32)
Calcium: 9.4 mg/dL (ref 8.4–10.5)
GFR calc Af Amer: >60 mL/min (ref >60)
Glucose, Bld: 110 mg/dL — ABNORMAL HIGH (ref 70–99)
Potassium: 4.6 mEq/L (ref 3.5–5.1)
Sodium: 138 mEq/L (ref 135–145)
Total Protein: 7.6 g/dL (ref 6.0–8.3)

## 2010-04-19 LAB — CBC
Hemoglobin: 16 g/dL (ref 13.0–17.0)
MCHC: 34.2 g/dL (ref 30.0–36.0)
RBC: 5.16 MIL/uL (ref 4.22–5.81)
RDW: 16.3 % — ABNORMAL HIGH (ref 11.5–15.5)

## 2010-05-12 ENCOUNTER — Emergency Department (HOSPITAL_COMMUNITY): Payer: Medicare Other

## 2010-05-12 ENCOUNTER — Emergency Department (HOSPITAL_COMMUNITY)
Admission: EM | Admit: 2010-05-12 | Discharge: 2010-05-12 | Disposition: A | Discharge status: Home / Self Care | Payer: Medicare Other | Attending: Emergency Medicine | Admitting: Emergency Medicine

## 2010-05-12 ENCOUNTER — Encounter: Payer: Self-pay | Admitting: *Deleted

## 2010-05-12 DIAGNOSIS — R059 Cough, unspecified: Secondary | ICD-10-CM | POA: Insufficient documentation

## 2010-05-12 DIAGNOSIS — R0989 Other specified symptoms and signs involving the circulatory and respiratory systems: Secondary | ICD-10-CM | POA: Insufficient documentation

## 2010-05-12 DIAGNOSIS — K639 Disease of intestine, unspecified: Secondary | ICD-10-CM

## 2010-05-12 DIAGNOSIS — K449 Diaphragmatic hernia without obstruction or gangrene: Secondary | ICD-10-CM

## 2010-05-12 DIAGNOSIS — Z7982 Long term (current) use of aspirin: Secondary | ICD-10-CM | POA: Insufficient documentation

## 2010-05-12 DIAGNOSIS — I2510 Atherosclerotic heart disease of native coronary artery without angina pectoris: Secondary | ICD-10-CM

## 2010-05-12 DIAGNOSIS — K219 Gastro-esophageal reflux disease without esophagitis: Secondary | ICD-10-CM

## 2010-05-12 DIAGNOSIS — Z79899 Other long term (current) drug therapy: Secondary | ICD-10-CM | POA: Insufficient documentation

## 2010-05-12 DIAGNOSIS — R0602 Shortness of breath: Secondary | ICD-10-CM | POA: Insufficient documentation

## 2010-05-12 DIAGNOSIS — I10 Essential (primary) hypertension: Secondary | ICD-10-CM | POA: Insufficient documentation

## 2010-05-12 DIAGNOSIS — I509 Heart failure, unspecified: Secondary | ICD-10-CM | POA: Insufficient documentation

## 2010-05-12 DIAGNOSIS — R05 Cough: Secondary | ICD-10-CM | POA: Insufficient documentation

## 2010-05-12 DIAGNOSIS — E039 Hypothyroidism, unspecified: Secondary | ICD-10-CM

## 2010-05-12 DIAGNOSIS — R609 Edema, unspecified: Secondary | ICD-10-CM | POA: Insufficient documentation

## 2010-05-12 LAB — PRO B NATRIURETIC PEPTIDE: Pro B Natriuretic peptide (BNP): 261.9 pg/mL (ref 0–450)

## 2010-05-12 LAB — DIFFERENTIAL
Basophils Absolute: 0.1 10*3/uL (ref 0.0–0.1)
Basophils Relative: 1 % (ref 0–1)
Neutro Abs: 10.5 10*3/uL — ABNORMAL HIGH (ref 1.7–7.7)
Neutrophils Relative %: 76 % (ref 43–77)

## 2010-05-12 LAB — CBC
Hemoglobin: 17.1 g/dL — ABNORMAL HIGH (ref 13.0–17.0)
MCHC: 33.1 g/dL (ref 30.0–36.0)
RBC: 5.85 MIL/uL — ABNORMAL HIGH (ref 4.22–5.81)

## 2010-05-12 LAB — BASIC METABOLIC PANEL
BUN: 18 mg/dL (ref 6–23)
Chloride: 95 mEq/L — ABNORMAL LOW (ref 96–112)
Potassium: 3.4 mEq/L — ABNORMAL LOW (ref 3.5–5.1)
Sodium: 136 mEq/L (ref 135–145)

## 2010-05-12 LAB — CK TOTAL AND CKMB (NOT AT ARMC): Total CK: 123 U/L (ref 7–232)

## 2010-05-12 LAB — POCT CARDIAC MARKERS
Myoglobin, poc: 128 ng/mL (ref 12–200)
Troponin i, poc: <0.05 ng/mL (ref 0.00–0.09)

## 2010-05-13 ENCOUNTER — Encounter: Payer: Self-pay | Admitting: *Deleted

## 2010-05-13 ENCOUNTER — Ambulatory Visit: Payer: Self-pay | Admitting: Cardiology

## 2010-05-13 DIAGNOSIS — E039 Hypothyroidism, unspecified: Secondary | ICD-10-CM | POA: Insufficient documentation

## 2010-05-13 DIAGNOSIS — I2510 Atherosclerotic heart disease of native coronary artery without angina pectoris: Secondary | ICD-10-CM | POA: Insufficient documentation

## 2010-05-13 DIAGNOSIS — K219 Gastro-esophageal reflux disease without esophagitis: Secondary | ICD-10-CM | POA: Insufficient documentation

## 2010-05-13 DIAGNOSIS — K449 Diaphragmatic hernia without obstruction or gangrene: Secondary | ICD-10-CM | POA: Insufficient documentation

## 2010-05-13 DIAGNOSIS — K639 Disease of intestine, unspecified: Secondary | ICD-10-CM | POA: Insufficient documentation

## 2010-05-14 ENCOUNTER — Telehealth: Payer: Self-pay | Admitting: Cardiology

## 2010-05-14 NOTE — Telephone Encounter (Signed)
FYI PT SAID HE WANTS TO WAIT AWHILE AND WILL CALL BACK LATER TO SCHEDULE WHEN HE IS STARTING TO FEEL BETTER. INFORMATION ONLY

## 2010-05-14 NOTE — Telephone Encounter (Signed)
Agree with above 

## 2010-05-14 NOTE — Telephone Encounter (Signed)
FYI

## 2010-05-18 IMAGING — US US ABDOMEN COMPLETE
1 series · 13 of 25 positions shown · non-contrast
Comparison: CT abdomen pelvis of 82,010

CLINICAL DATA: Abdominal pain, nausea, loss of appetite

COMPLETE ABDOMINAL ULTRASOUND

[Series 1: us abdomen complete · 0.30mm/px · 13 of 59 slices shown]
[im 1/59]
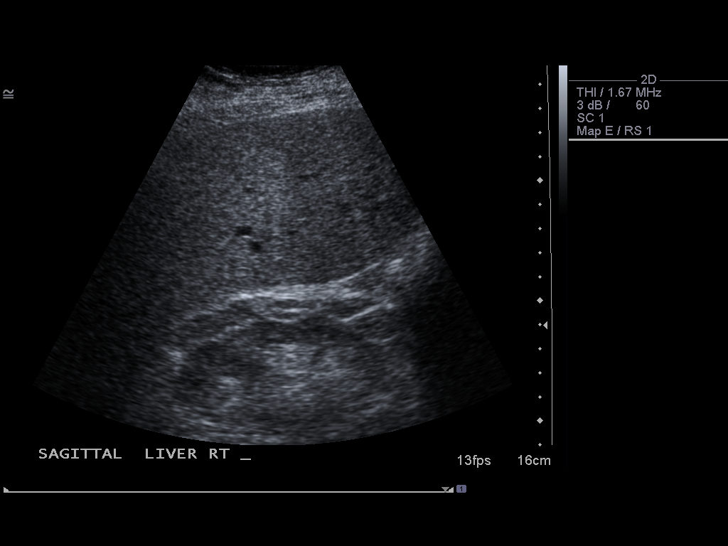
[im 5/59]
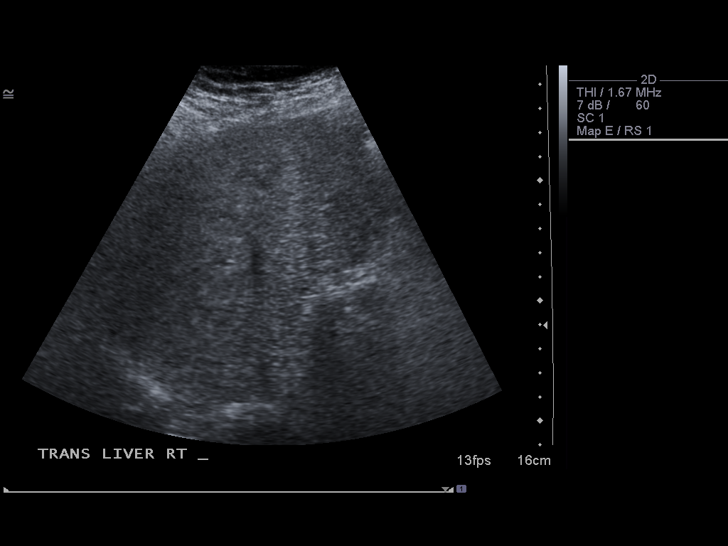
[im 10/59]
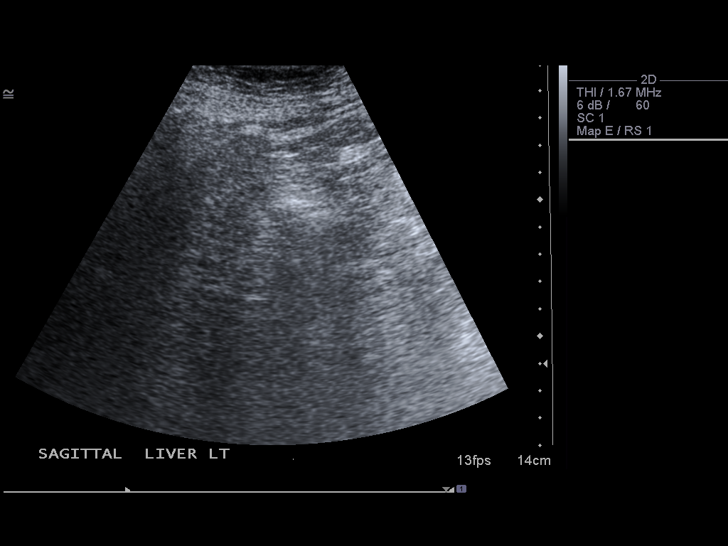
[im 15/59]
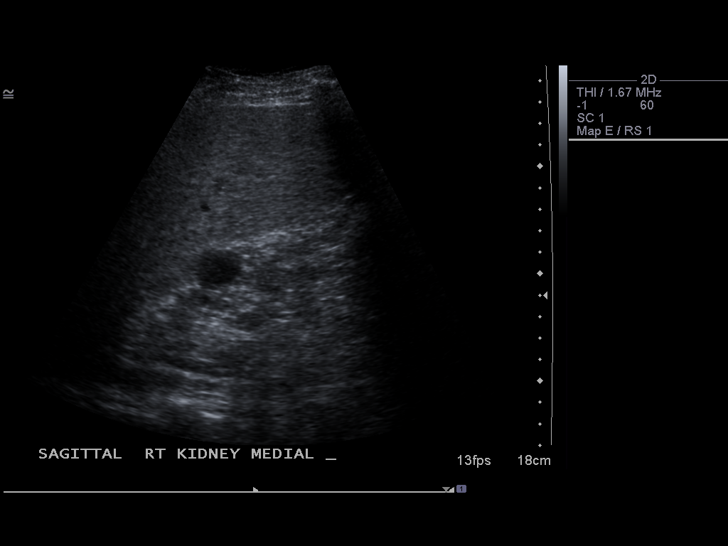
[im 20/59]
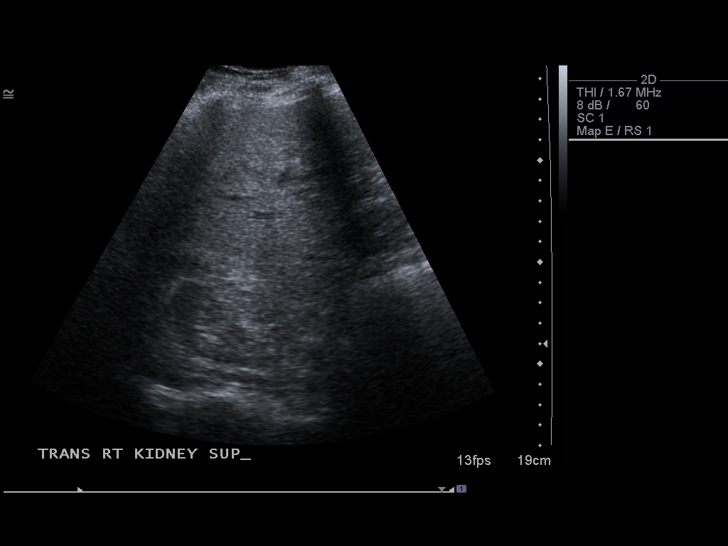
[im 25/59]
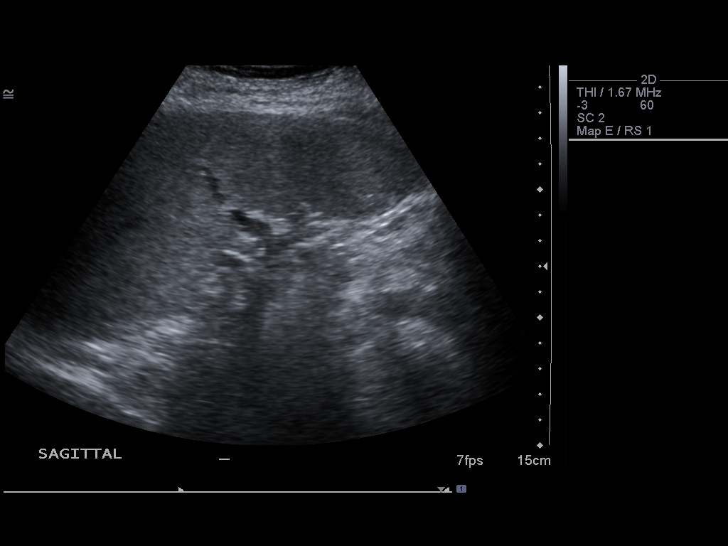
[im 30/59]
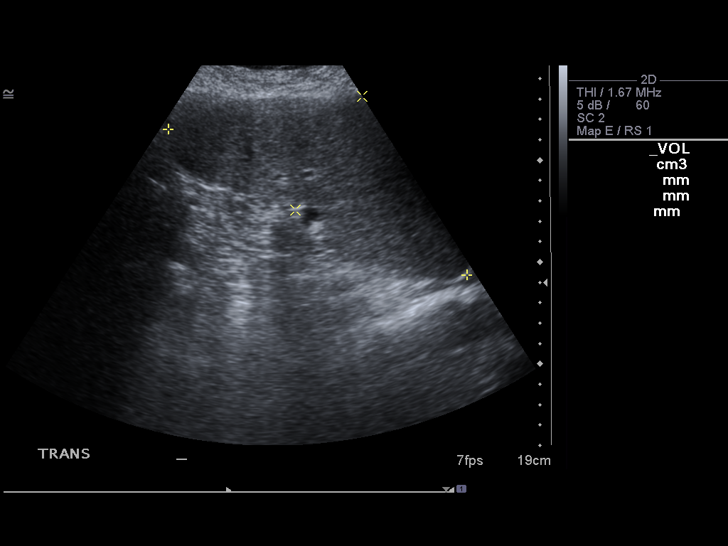
[im 34/59]
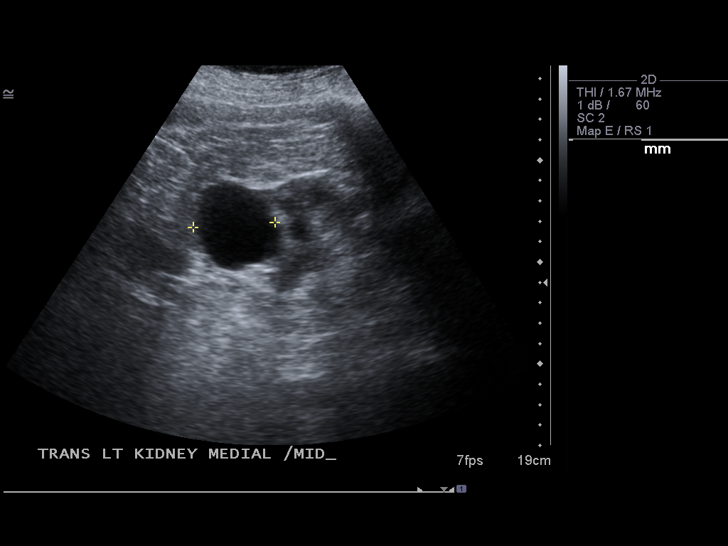
[im 39/59]
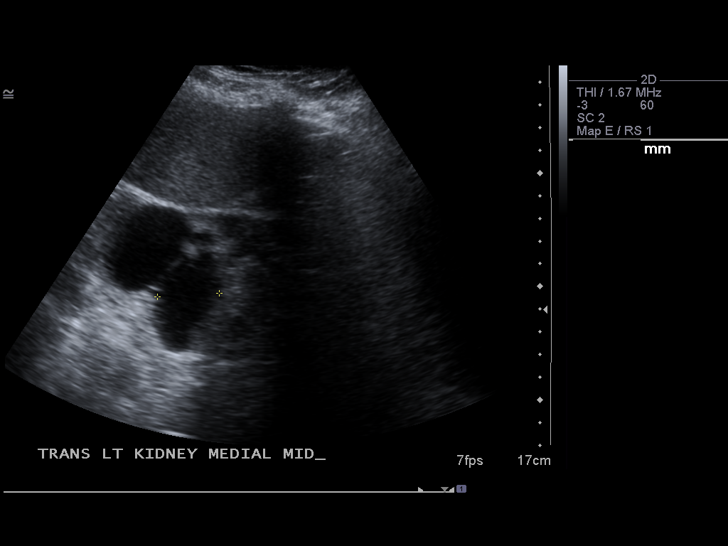
[im 44/59]
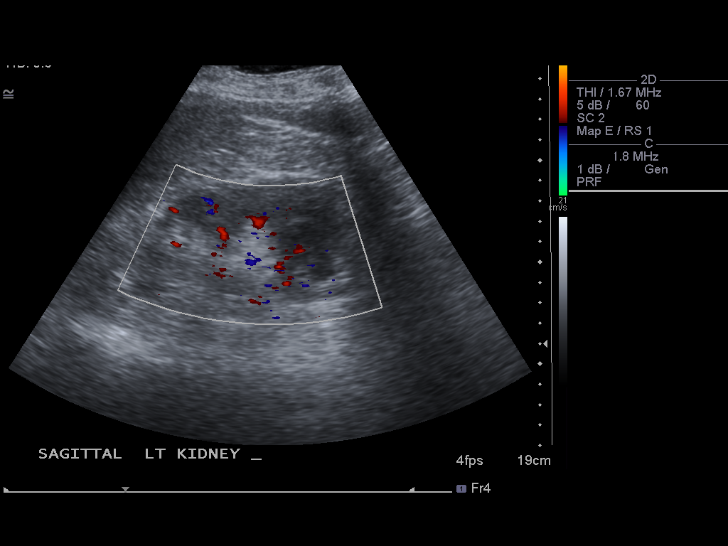
[im 49/59]
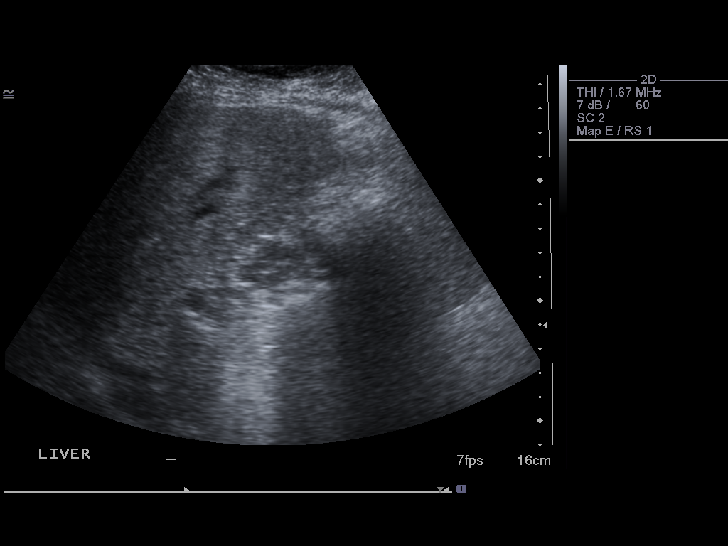
[im 54/59]
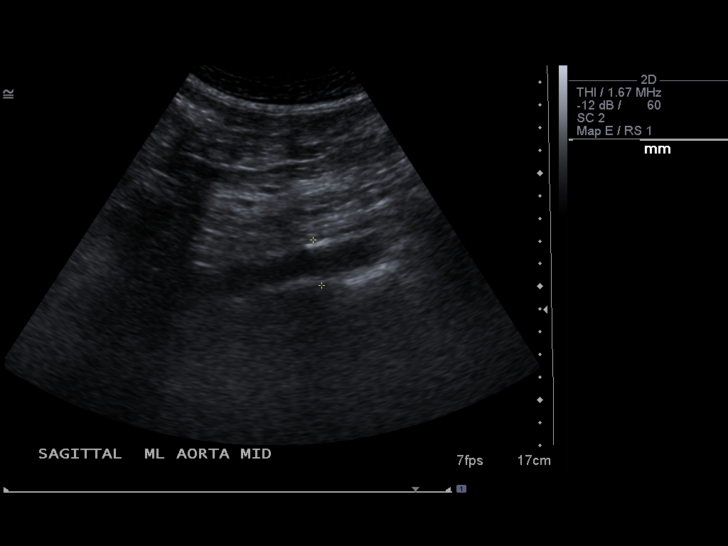
[im 59/59]
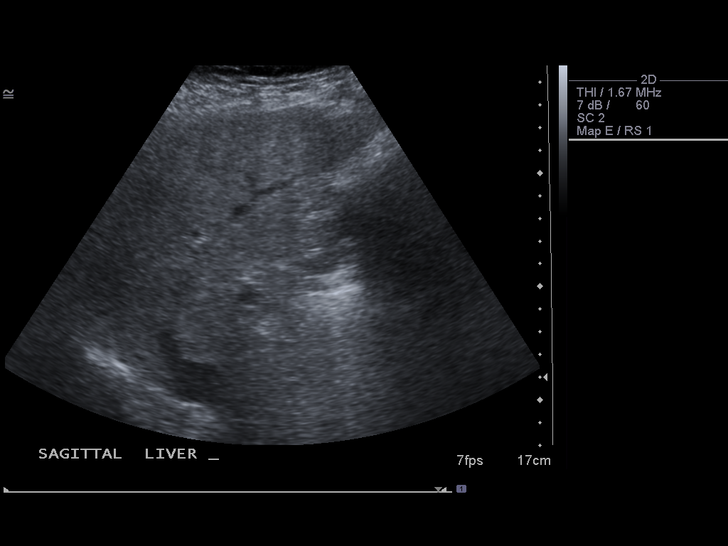

[13 of 25 positions shown; findings below may reference images not displayed]

FINDINGS: Gallbladder:  The gallbladder has been resected.

Common bile duct:  The common bile duct is normal measuring 4.1 mm
in diameter.

Liver:  The liver appears echogenic diffusely consistent with fatty
infiltration but is partially obscured by bowel gas.  The left lobe
is not well evaluated.  No ductal dilatation is seen.

IVC:  Appears normal.

Pancreas:  The pancreas is obscured by bowel gas and cannot be
evaluated .

Spleen:  The spleen is enlarged with a total volume of 895 ml.

Right Kidney:  No hydronephrosis is seen.  The right kidney
measures 9.8 cm sagittally with some cortical thickening.  The
echogenicity of renal parenchyma is increased suggestive of chronic
renal medical disease.  Correlate with renal laboratory values.  A
cyst is noted medially of 2.0 x 1.8 x 1.7 cm.

Left Kidney:  No hydronephrosis.  The left kidney measures 13.0 cm.
There are cystic structures present the largest of 5.5 x 5.2 x
cm.  The parenchyma does appear to be echogenic, again suggestive
of chronic renal medical disease.

Abdominal aorta:  Abdominal aorta is partially obscured by bowel
gas.
IMPRESSION: 1.  Fatty infiltration of the liver.  Portions of the liver are
obscured by bowel gas as is the pancreas.
2.  Echogenic renal parenchyma suggestive of chronic renal medical
disease.  Correlate with renal laboratory values.
3.  Splenomegaly.

## 2010-05-26 NOTE — Assessment & Plan Note (Signed)
[facility]                         GASTROENTEROLOGY OFFICE NOTE   NAME:Scott Mooney, Scott Mooney                       MRN:          [id number]  DATE:12/23/2006                            DOB:          July 30, 1929    Scott Mooney is a 77 year old white male with a chronic idiopathic  cardiomyopathy and mild congestive heart failure.  I have seen him on  several occasions because of recurrent diverticulitis and recurrent  colon polyps.  His last colonoscopy was in June of 2007 and is detailed  on our chart.  He comes to the office today complaining of intermittent  solid-food dysphagia over the last 6 months with the sensation that food  sticks in his upper esophageal area after eating large pieces of meat or  bread.  He denies chronic reflux symptoms, and he also denies problems  swallowing liquids.  He has not had to go to the emergency room because  of this problem.  I cannot see in his chart where he has ever had  previous endoscopy or barium studies of his upper gut.  Despite all  these complaints, his appetite is good and his weight is stable.   He denies any hepatobiliary complaints, is having regular bowel  movements without melena or hematochezia.   PAST MEDICAL HISTORY:  Remarkable for chronic cardiomyopathy with mild  congestive heart failure, degenerative arthritis, he is status post  cholecystectomy in 2004, and he had removal of a pituitary tumor in 2001  which required neurosurgery and postop radiation.  He is followed by Dr.  Jarome Mooney and has panhypopituitarism and a history of diabetes  insipidus.   MEDICATIONS:  1. L-thyroxine 25 mg a day.  2. Allopurinol 100 mg a day for gout.  3. Isorbid 30 mg twice a day.  4. Hydrocortisone 20 mg twice a day.  5. Lasix 40 mg several times a day for peripheral edema.  6. Aspirin 81 mg a day.  7. Vitamin D twice a day.  8. Potassium replacement 25 mg 3 times a day.   He, in the past, has had  reactions to PENICILLIN.   FAMILY HISTORY:  Noncontributory except for colon cancer in his brother.   SOCIAL HISTORY:  He is married and lives with his wife of many years and  is retired from [employer].  He does not smoke or abuse  ethanol.   REVIEW OF SYSTEMS:  He has chronic peripheral edema and is on chronic  diuretic therapy.  He currently denies symptoms of congestive heart  failure, does not have chest pain with exertion.  He denies any history  of previous CVAs, other neurological problems, heart attacks, etc.  His  last transfusion was in 2001, but he gives no history of hepatitis.  Review of systems otherwise negative.   I do not have any labs in his chart on review.   PHYSICAL EXAMINATION:  He is an elderly-appearing white male in no acute  distress.  He is 5 feet 4 inches and weighs 226 pounds.  Blood pressure is 140/72  and pulse was 76 and regular.  I could not appreciate stigmata of chronic liver disease or thyromegaly  or lymphadenopathy in his neck.  Examination of the oropharynx was unremarkable, although he did have a  very active gag reflex.  His chest was clear without rales or rhonchi.  He appeared to be in a regular rhythm without murmurs, gallops, or rubs.  His abdomen was somewhat obese, but there was no definite organomegaly,  masses, or tenderness.  There was peripheral edema, swelling of his legs, but no pitting.  There  was mild stasis dermatitis noted.  Mental status was clear.  Rectal exam was deferred.   ASSESSMENT:  1. Probable Schatzki's ring in the distal esophagus with intermittent      solid-food dysphagia, rule out chronic gastroesophageal reflux      disease and Barrett's mucosa.  2. History of recurrent colon polyps and strong family history of      colon cancer in his brother.  3. History of asymptomatic diverticulosis.  4. Idiopathic cardiomyopathy with treated chronic congestive heart      failure.  5. Status post  removal of the pituitary gland with treatment with      multiple medications per Dr. Jarome Mooney for      panhypopituitarism.  6. Chronic thyroid dysfunction related to #5.  7. History of gouty arthritis on allopurinol.   RECOMMENDATIONS:  I scheduled Scott Mooney at his request for endoscopy and  dilation later today.  I have asked him to take his medications with  some liquids up to 4 hours before the endoscopic procedure.  The risks  and benefits of this procedure have been explained in detail, and he has  agreed to proceed as planned.  Will continue all of his other  medications with adjustment as needed per his endoscopic results.     Scott Rea. Jarold Motto, MD, Caleen Essex, FAGA  Electronically Signed    DRP/MedQ  DD: 12/23/2006  DT: 12/23/2006  Job #: [id number]   cc:   Cassell Clement, M.D.  Barry Dienes Eloise Harman, M.D.

## 2010-07-04 ENCOUNTER — Emergency Department (HOSPITAL_COMMUNITY): Payer: Medicare Other

## 2010-07-04 ENCOUNTER — Emergency Department (HOSPITAL_COMMUNITY)
Admission: EM | Admit: 2010-07-04 | Discharge: 2010-07-05 | Disposition: A | Discharge status: Home / Self Care | Payer: Medicare Other | Attending: Emergency Medicine | Admitting: Emergency Medicine

## 2010-07-04 DIAGNOSIS — I10 Essential (primary) hypertension: Secondary | ICD-10-CM | POA: Insufficient documentation

## 2010-07-04 DIAGNOSIS — J40 Bronchitis, not specified as acute or chronic: Secondary | ICD-10-CM | POA: Insufficient documentation

## 2010-07-04 DIAGNOSIS — R062 Wheezing: Secondary | ICD-10-CM | POA: Insufficient documentation

## 2010-07-04 DIAGNOSIS — R059 Cough, unspecified: Secondary | ICD-10-CM | POA: Insufficient documentation

## 2010-07-04 DIAGNOSIS — R05 Cough: Secondary | ICD-10-CM | POA: Insufficient documentation

## 2010-07-04 DIAGNOSIS — R0902 Hypoxemia: Secondary | ICD-10-CM | POA: Insufficient documentation

## 2010-07-04 DIAGNOSIS — R110 Nausea: Secondary | ICD-10-CM | POA: Insufficient documentation

## 2010-07-04 DIAGNOSIS — R0602 Shortness of breath: Secondary | ICD-10-CM | POA: Insufficient documentation

## 2010-07-04 DIAGNOSIS — I509 Heart failure, unspecified: Secondary | ICD-10-CM | POA: Insufficient documentation

## 2010-07-04 DIAGNOSIS — R51 Headache: Secondary | ICD-10-CM | POA: Insufficient documentation

## 2010-07-04 LAB — COMPREHENSIVE METABOLIC PANEL
ALT: 43 U/L (ref 0–53)
Albumin: 3.8 g/dL (ref 3.5–5.2)
Alkaline Phosphatase: 96 U/L (ref 39–117)
Calcium: 9.1 mg/dL (ref 8.4–10.5)
GFR calc Af Amer: >60 mL/min (ref >60)
Potassium: 3.9 mEq/L (ref 3.5–5.1)
Sodium: 136 mEq/L (ref 135–145)
Total Protein: 7.2 g/dL (ref 6.0–8.3)

## 2010-07-04 LAB — TROPONIN I
Troponin I: <0.3 ng/mL (ref <0.30)
Troponin I: <0.3 ng/mL (ref <0.30)

## 2010-07-04 LAB — URINALYSIS, ROUTINE W REFLEX MICROSCOPIC
Bilirubin Urine: NEGATIVE
Glucose, UA: NEGATIVE mg/dL
Hgb urine dipstick: NEGATIVE
Ketones, ur: NEGATIVE mg/dL
Specific Gravity, Urine: 1.015 (ref 1.005–1.030)
pH: 6 (ref 5.0–8.0)

## 2010-07-04 LAB — POCT I-STAT 3, ART BLOOD GAS (G3+)
pCO2 arterial: 44.1 mmHg (ref 35.0–45.0)
pH, Arterial: 7.385 (ref 7.350–7.450)
pO2, Arterial: 64 mmHg — ABNORMAL LOW (ref 80.0–100.0)

## 2010-07-04 LAB — CK TOTAL AND CKMB (NOT AT ARMC)
CK, MB: 1.6 ng/mL (ref 0.3–4.0)
Relative Index: INVALID (ref 0.0–2.5)
Relative Index: INVALID (ref 0.0–2.5)

## 2010-07-04 LAB — DIFFERENTIAL
Eosinophils Absolute: 0.2 10*3/uL (ref 0.0–0.7)
Lymphocytes Relative: 17 % (ref 12–46)
Lymphs Abs: 1.6 10*3/uL (ref 0.7–4.0)
Monocytes Relative: 5 % (ref 3–12)
Neutrophils Relative %: 75 % (ref 43–77)

## 2010-07-04 LAB — CBC
HCT: 48.3 % (ref 39.0–52.0)
Hemoglobin: 16.2 g/dL (ref 13.0–17.0)
MCH: 29.1 pg (ref 26.0–34.0)
MCV: 86.9 fL (ref 78.0–100.0)
Platelets: 153 10*3/uL (ref 150–400)
RBC: 5.56 MIL/uL (ref 4.22–5.81)

## 2010-07-05 MED ORDER — IOHEXOL 300 MG/ML  SOLN
100.0000 mL | Freq: Once | INTRAMUSCULAR | Status: AC | PRN
  Administered 2010-07-05: 100 mL via INTRAVENOUS

## 2010-08-14 ENCOUNTER — Ambulatory Visit (INDEPENDENT_AMBULATORY_CARE_PROVIDER_SITE_OTHER): Payer: Medicare Other | Admitting: Cardiology

## 2010-08-14 ENCOUNTER — Encounter: Payer: Self-pay | Admitting: Cardiology

## 2010-08-14 DIAGNOSIS — E669 Obesity, unspecified: Secondary | ICD-10-CM

## 2010-08-14 DIAGNOSIS — K219 Gastro-esophageal reflux disease without esophagitis: Secondary | ICD-10-CM

## 2010-08-14 DIAGNOSIS — I509 Heart failure, unspecified: Secondary | ICD-10-CM

## 2010-08-14 DIAGNOSIS — E6609 Other obesity due to excess calories: Secondary | ICD-10-CM

## 2010-08-14 NOTE — Assessment & Plan Note (Signed)
The patient continues to gain weight.  His weight is up 3 more pounds since his last visit 6 months ago.  He readily admits that he eats too much.  We talked about the importance of weight loss.

## 2010-08-14 NOTE — Assessment & Plan Note (Signed)
The patient has symptoms of mild GERD.  He has not been experiencing any problems with hematochezia or melena.

## 2010-08-14 NOTE — Assessment & Plan Note (Signed)
The patient has a past history of chronic diastolic congestive heart failure with normal systolic function.  He has not been experiencing any new symptoms of worsening CHF.  He sleeps on 2 pillows.  He is not having paroxysmal nocturnal dyspnea.  He has exogenous obesity and his weight is up 3 pounds.

## 2010-08-14 NOTE — Progress Notes (Signed)
Scott Mooney Date of Birth:  1929-04-14 [facility] [address]   [address] [place], [place]  [address] [phone]           Fax   [phone]  History of Present Illness: This pleasant 77 year old gentleman is seen for a six-month followup office visit.  He has a history of compensated diastolic congestive heart failure.  His last echocardiogram on 05/07/09 showed an ejection fraction of 55-60% with normal systolic function and a moderate left ventricle hypertrophy and with diastolic dysfunction and pseudonormalization.  Patient also has a history of mild aortic sclerosis with trivial aortic insufficiency by echo as well as mild mitral regurgitation and normal pulmonary artery pressure by echo.  Since last visit he's not had any new cardiac symptoms.  He has had 2 visits to the emergency room for wheezing which responded to nebulizer treatment and his chest x-ray at the time of those visits showed no congestive heart failure although he does have chronic cardiomegaly.  The patient does not have any history of ischemic heart disease or chest pain.  The patient has an interesting past endocrine history with a past history of a pituitary tumor with subsequent development of pituitary insufficiency and hypothyroidism and is on hormone replacement therapy in the form of hydrocortisone and levothyroxine.  He has a history of a small hiatal hernia and mild gastroesophageal reflux in the past.  He's had a history of exogenous obesity and his wife is discouraged that the patient continues to eat more than he needs to eat.  Current Outpatient Prescriptions  Medication Sig Dispense Refill  . aspirin 81 MG tablet Take 81 mg by mouth daily.        Marland Kitchen atenolol (TENORMIN) 25 MG tablet Take 25 mg by mouth daily.        . ergocalciferol (VITAMIN D2) 50000 UNITS capsule Take 50,000 Units by mouth once a week.        . furosemide (LASIX) 40 MG tablet Take 20 mg by mouth as directed.         . hydrocortisone (CORTEF) 20 MG tablet Take 10 mg by mouth 2 (two) times daily.        . isosorbide mononitrate (IMDUR) 60 MG 24 hr tablet Take 60 mg by mouth daily.        Marland Kitchen levothyroxine (SYNTHROID, LEVOTHROID) 50 MCG tablet Take 50 mcg by mouth daily.        . multivitamin (THERAGRAN) per tablet Take 1 tablet by mouth daily.        Marland Kitchen omeprazole (PRILOSEC) 20 MG capsule Take 20 mg by mouth daily.        Marland Kitchen POTASSIUM PO Take 2 tablets by mouth daily.        . ramipril (ALTACE) 2.5 MG tablet Take 2.5 mg by mouth daily.        . Vitamin D, Cholecalciferol, 400 UNITS CHEW Chew 1 tablet by mouth daily.        Marland Kitchen levothyroxine (SYNTHROID, LEVOTHROID) 125 MCG tablet Take 50 mcg by mouth daily.         Allergies  Allergen Reactions  . Penicillins     Patient Active Problem List  Diagnoses  . ADENOMATOUS COLONIC POLYP  . PANHYPOPITUITARISM  . DIABETES INSIPIDUS  . CARDIOMYOPATHY  . CHF, MILD  . HEMORRHOIDS, INTERNAL  . DIVERTICULOSIS, COLON  . ARTHRITIS  . WEIGHT LOSS-ABNORMAL  . HEADACHE  . NAUSEA AND VOMITING  . DIARRHEA, ACUTE  .  ABDOMINAL PAIN, UNSPECIFIED SITE  . FECAL OCCULT BLOOD  . Coronary artery disease  . Hiatal hernia  . Hypothyroidism  . GERD (gastroesophageal reflux disease)  . Splenic flexure syndrome    History  Smoking status  . Former Smoker  Smokeless tobacco  . Not on file    History  Alcohol Use: Not on file    Family History  Problem Relation Age of Onset  . Colon cancer Brother     Review of Systems: Constitutional: no fever chills diaphoresis or fatigue or change in weight.  Head and neck: no hearing loss, no epistaxis, no photophobia or visual disturbance. Respiratory: No cough, shortness of breath or wheezing. Cardiovascular: No chest pain peripheral edema, palpitations. Gastrointestinal: No abdominal distention, no abdominal pain, no change in bowel habits hematochezia or melena. Genitourinary: No dysuria, no frequency, no urgency,  no nocturia. Musculoskeletal:No arthralgias, no back pain, no gait disturbance or myalgias. Neurological: No dizziness, no headaches, no numbness, no seizures, no syncope, no weakness, no tremors. Hematologic: No lymphadenopathy, no easy bruising. Psychiatric: No confusion, no hallucinations, no sleep disturbance.    Physical Exam: Filed Vitals:   08/14/10 1131  BP: 134/76  Pulse: 64    The general appearance reveals an overweight gentleman in no acute distress.  He is mildly cushingoid from his chronic steroid therapy.Pupils equal and reactive.   Extraocular Movements are full.  There is no scleral icterus.  The mouth and pharynx are normal.  The neck is supple.  The carotids reveal no bruits.  The jugular venous pressure is normal.  The thyroid is not enlarged.  There is no lymphadenopathy.  The chest is clear to percussion and auscultation. There are no rales or rhonchi. Expansion of the chest is symmetrical.  The precordium is quiet.  The first heart sound is normal.  The second heart sound is physiologically split.  There is no murmur gallop rub or click.  There is no abnormal lift or heave.  The abdomen is soft and nontender. Bowel sounds are normal. The liver and spleen are not enlarged. There Are no abdominal masses. There are no bruits.  The abdomen is obese The pedal pulses are good.  There is no phlebitis or edema.  There is no cyanosis or clubbing.His ankles are quite thick but there is no pitting edema Strength is normal and symmetrical in all extremities.  There is no lateralizing weakness.  There are no sensory deficits.  The skin is warm and dry.  There is no rash.         Assessment / Plan: Continue same medication.  Recheck in 6 months for office visit and EKG.  Try harder to cut back on calories and lose weight.

## 2010-09-13 ENCOUNTER — Emergency Department (HOSPITAL_COMMUNITY): Payer: Medicare Other

## 2010-09-13 ENCOUNTER — Emergency Department (HOSPITAL_COMMUNITY)
Admission: EM | Admit: 2010-09-13 | Discharge: 2010-09-13 | Disposition: A | Discharge status: Home / Self Care | Payer: Medicare Other | Attending: Emergency Medicine | Admitting: Emergency Medicine

## 2010-09-13 DIAGNOSIS — R630 Anorexia: Secondary | ICD-10-CM | POA: Insufficient documentation

## 2010-09-13 DIAGNOSIS — Z7982 Long term (current) use of aspirin: Secondary | ICD-10-CM | POA: Insufficient documentation

## 2010-09-13 DIAGNOSIS — R5381 Other malaise: Secondary | ICD-10-CM | POA: Insufficient documentation

## 2010-09-13 DIAGNOSIS — R110 Nausea: Secondary | ICD-10-CM | POA: Insufficient documentation

## 2010-09-13 DIAGNOSIS — R197 Diarrhea, unspecified: Secondary | ICD-10-CM | POA: Insufficient documentation

## 2010-09-13 DIAGNOSIS — R109 Unspecified abdominal pain: Secondary | ICD-10-CM | POA: Insufficient documentation

## 2010-09-13 DIAGNOSIS — I10 Essential (primary) hypertension: Secondary | ICD-10-CM | POA: Insufficient documentation

## 2010-09-13 DIAGNOSIS — I509 Heart failure, unspecified: Secondary | ICD-10-CM | POA: Insufficient documentation

## 2010-09-13 LAB — COMPREHENSIVE METABOLIC PANEL
ALT: 52 U/L (ref 0–53)
AST: 54 U/L — ABNORMAL HIGH (ref 0–37)
Alkaline Phosphatase: 98 U/L (ref 39–117)
Calcium: 8.8 mg/dL (ref 8.4–10.5)
GFR calc Af Amer: >60 mL/min (ref >60)
Glucose, Bld: 106 mg/dL — ABNORMAL HIGH (ref 70–99)
Potassium: 3.6 mEq/L (ref 3.5–5.1)
Sodium: 133 mEq/L — ABNORMAL LOW (ref 135–145)
Total Protein: 6.9 g/dL (ref 6.0–8.3)

## 2010-09-13 LAB — CBC
Hemoglobin: 15 g/dL (ref 13.0–17.0)
MCHC: 32.9 g/dL (ref 30.0–36.0)
Platelets: 139 10*3/uL — ABNORMAL LOW (ref 150–400)
RBC: 5.23 MIL/uL (ref 4.22–5.81)

## 2010-09-13 LAB — DIFFERENTIAL
Basophils Absolute: 0 10*3/uL (ref 0.0–0.1)
Basophils Relative: 1 % (ref 0–1)
Eosinophils Absolute: 0.2 10*3/uL (ref 0.0–0.7)
Monocytes Absolute: 0.5 10*3/uL (ref 0.1–1.0)
Neutro Abs: 6.1 10*3/uL (ref 1.7–7.7)
Neutrophils Relative %: 74 % (ref 43–77)

## 2010-09-13 LAB — URINALYSIS, ROUTINE W REFLEX MICROSCOPIC
Nitrite: NEGATIVE
Protein, ur: NEGATIVE mg/dL
Specific Gravity, Urine: 1.018 (ref 1.005–1.030)
Urobilinogen, UA: 0.2 mg/dL (ref 0.0–1.0)

## 2010-10-01 ENCOUNTER — Encounter: Payer: Self-pay | Admitting: *Deleted

## 2010-10-02 ENCOUNTER — Encounter: Payer: Self-pay | Admitting: Gastroenterology

## 2010-10-02 ENCOUNTER — Ambulatory Visit (INDEPENDENT_AMBULATORY_CARE_PROVIDER_SITE_OTHER): Payer: Medicare Other | Admitting: Gastroenterology

## 2010-10-02 DIAGNOSIS — R634 Abnormal weight loss: Secondary | ICD-10-CM

## 2010-10-02 DIAGNOSIS — R110 Nausea: Secondary | ICD-10-CM

## 2010-10-02 DIAGNOSIS — Z9089 Acquired absence of other organs: Secondary | ICD-10-CM

## 2010-10-02 DIAGNOSIS — K219 Gastro-esophageal reflux disease without esophagitis: Secondary | ICD-10-CM

## 2010-10-02 DIAGNOSIS — Z9049 Acquired absence of other specified parts of digestive tract: Secondary | ICD-10-CM

## 2010-10-02 MED ORDER — METOCLOPRAMIDE HCL 10 MG PO TABS: 10.0000 mg | ORAL_TABLET | Freq: Four times a day (QID) | ORAL | Status: AC

## 2010-10-02 MED ORDER — RABEPRAZOLE SODIUM 20 MG PO TBEC: 20.0000 mg | DELAYED_RELEASE_TABLET | Freq: Every day | ORAL | Status: DC

## 2010-10-02 NOTE — Patient Instructions (Signed)
Your procedure has been scheduled for 10/05/2010, please follow the seperate instructions.  We have sent in Reglan 10mg  to take at night before bed. Stop your Prilosec and start Aciphex samples 30 min before supper nightly, samples given.

## 2010-10-02 NOTE — Progress Notes (Signed)
This is a very complicated 77 year old Caucasian male with pan hypopituitary syndrome managed by Dr. Jarome Matin. He also has chronic cardiomyopathy, obesity, thyroid dysfunction, hypertension, and peripheral edema. He has had previous GI workups which have shown diverticulosis, recurrent colon polyps, and fatty liver with mild elevation of transaminases. His current problem is persistent nausea with lack of appetite and some acid reflux symptoms better on Prilosec 20 mg a day. He denies true dysphagia or emesis of partially digested food products. Past workups have shown no evidence of bowel obstruction. Other problems currently are recurrent dyspnea and shortness of breath despite Lasix, Tenormin, Imdur, and Altace. He recently was in the emergency room x2 and had a negative pulmonary CT angiogram for pulmonary emboli. His past history is remarkable for multiple colon polyps, but is not due for followup at this time. Recent labs were reviewed September 6 and show normal CBC, and metabolic profile except for an SGOT of 64 and SGPT of 54. Albumin level was normal at 3.7 g percent. Patient does not use alcohol, cigarettes, or NSAIDs. No multiple medications for the pituitary dysfunction.  Current Medications, Allergies, Past Medical History, Past Surgical History, Family History and Social History were reviewed in [employer] record.  Pertinent Review of Systems Negative... he does have weakness, malaise, ambulation problems, peripheral edema, and lack of appetite. He denies melena, hematochezia, icterus, or other hepatobiliary complaints. He continues with shortness of breath but denies cough, sputum production, or hemoptysis. Another major complaint is peripheral edema despite Lasix therapy.   Physical Exam: Elderly appearing obese white male in no acute distress. I cannot appreciate stigmata of chronic liver disease. His chest is generally clear without wheezes or rhonchi. He  appears to be in a regular rhythm without murmurs gallops or rubs. He has an obese abdomen without definite organomegaly, masses or tenderness. Bowel sounds are normal. He does have a succussion splash in the epigastric area. There is +1 pitting peripheral edema with superficial stasis dermatitis. Mental status appears normal.    Assessment and Plan: Probable gastroparesis related to his multiple medical problems versus significant acid reflux disease, rule out partial gastric outlet obstruction. I have scheduled him for endoscopy Monday afternoon. He is to continue PPI but I have changed him to AcipHex 20 mg a day and we will try Reglan 10 mg at bedtime which also should help his insomnia. Otherwise he is to continue medications as per primary care. Encounter Diagnoses  Name Primary?  . Nausea   . Weight loss   . GERD (gastroesophageal reflux disease)   . Status post cholecystectomy

## 2010-10-05 ENCOUNTER — Encounter: Payer: Self-pay | Admitting: Gastroenterology

## 2010-10-05 ENCOUNTER — Ambulatory Visit (AMBULATORY_SURGERY_CENTER): Payer: Medicare Other | Admitting: Gastroenterology

## 2010-10-05 DIAGNOSIS — R109 Unspecified abdominal pain: Secondary | ICD-10-CM

## 2010-10-05 DIAGNOSIS — R634 Abnormal weight loss: Secondary | ICD-10-CM

## 2010-10-05 DIAGNOSIS — K449 Diaphragmatic hernia without obstruction or gangrene: Secondary | ICD-10-CM

## 2010-10-05 DIAGNOSIS — R110 Nausea: Secondary | ICD-10-CM

## 2010-10-05 DIAGNOSIS — K209 Esophagitis, unspecified: Secondary | ICD-10-CM

## 2010-10-05 DIAGNOSIS — K2990 Gastroduodenitis, unspecified, without bleeding: Secondary | ICD-10-CM

## 2010-10-05 DIAGNOSIS — K2270 Barrett's esophagus without dysplasia: Secondary | ICD-10-CM

## 2010-10-05 DIAGNOSIS — K219 Gastro-esophageal reflux disease without esophagitis: Secondary | ICD-10-CM | POA: Insufficient documentation

## 2010-10-05 DIAGNOSIS — K222 Esophageal obstruction: Secondary | ICD-10-CM | POA: Insufficient documentation

## 2010-10-05 DIAGNOSIS — K2970 Gastritis, unspecified, without bleeding: Secondary | ICD-10-CM

## 2010-10-05 MED ORDER — SODIUM CHLORIDE 0.9 % IV SOLN: 500.0000 mL | INTRAVENOUS | Status: DC

## 2010-10-05 NOTE — Patient Instructions (Signed)
Follow your post dilatation diet:: Nothing to eat or drink until 4 pm. 4 pm until 5 pm clear liquids only. After 5 pm only soft foods.  Continue your medications.  Call Dr. Jarold Motto if he continues to have trouble swallowing in one week.

## 2010-10-06 ENCOUNTER — Telehealth: Payer: Self-pay | Admitting: *Deleted

## 2010-10-06 NOTE — Telephone Encounter (Signed)

## 2010-10-08 DIAGNOSIS — K2990 Gastroduodenitis, unspecified, without bleeding: Secondary | ICD-10-CM

## 2010-10-08 DIAGNOSIS — R112 Nausea with vomiting, unspecified: Secondary | ICD-10-CM

## 2010-10-08 DIAGNOSIS — K2970 Gastritis, unspecified, without bleeding: Secondary | ICD-10-CM

## 2010-10-08 LAB — HELICOBACTER PYLORI SCREEN-BIOPSY: UREASE: NEGATIVE

## 2010-10-13 ENCOUNTER — Encounter: Payer: Self-pay | Admitting: Gastroenterology

## 2011-02-10 ENCOUNTER — Ambulatory Visit (INDEPENDENT_AMBULATORY_CARE_PROVIDER_SITE_OTHER): Payer: Medicare Other | Admitting: Cardiology

## 2011-02-10 ENCOUNTER — Encounter: Payer: Self-pay | Admitting: Cardiology

## 2011-02-10 DIAGNOSIS — I5032 Chronic diastolic (congestive) heart failure: Secondary | ICD-10-CM | POA: Insufficient documentation

## 2011-02-10 DIAGNOSIS — K222 Esophageal obstruction: Secondary | ICD-10-CM

## 2011-02-10 DIAGNOSIS — I509 Heart failure, unspecified: Secondary | ICD-10-CM

## 2011-02-10 DIAGNOSIS — E230 Hypopituitarism: Secondary | ICD-10-CM

## 2011-02-10 DIAGNOSIS — I2510 Atherosclerotic heart disease of native coronary artery without angina pectoris: Secondary | ICD-10-CM

## 2011-02-10 DIAGNOSIS — K219 Gastro-esophageal reflux disease without esophagitis: Secondary | ICD-10-CM

## 2011-02-10 DIAGNOSIS — I452 Bifascicular block: Secondary | ICD-10-CM

## 2011-02-10 NOTE — Assessment & Plan Note (Signed)
The patient has a history of bifascicular block.  He has not been having any dizzy spells or syncope or Stokes-Adams attacks.  His electrocardiogram today shows no change in his bifascicular block pattern since 11/10/09

## 2011-02-10 NOTE — Patient Instructions (Signed)
Your physician recommends that you continue on your current medications as directed. Please refer to the Current Medication list given to you today.  Your physician wants you to follow-up in: 6 months. You will receive a reminder letter in the mail two months in advance. If you don't receive a letter, please call our office to schedule the follow-up appointment.  

## 2011-02-10 NOTE — Assessment & Plan Note (Signed)
The patient has had no new symptoms referable to his chronic diastolic congestive heart failure.  He is not having any orthopnea or paroxysmal nocturnal dyspnea.  His peripheral edema has improved.  Weight is down 18 pounds

## 2011-02-10 NOTE — Assessment & Plan Note (Signed)
The patient reports that he has had dilatation of his esophagus twice by Dr. Sheryn Bison.  At the present time he is not having any symptoms of GERD or reflux and his swallowing is satisfactory

## 2011-02-10 NOTE — Progress Notes (Signed)
Scott Mooney Date of Birth:  July 03, 1929 [facility] [address] [place], [place]  [address] [phone]         Fax   [phone]  History of Present Illness: This pleasant 78 year old gentleman is seen for a scheduled 6 month followup office visit he has a history of compensated diastolic congestive heart failure.  His last echocardiogram on 05/07/09 showed an ejection fraction of 55-60% with normal systolic function and moderate LVH and with grade 2 diastolic dysfunction.  The patient does not have any history of ischemic heart disease.  He has not had cardiac catheterization.  He did have a normal nuclear stress test at [facility] in 2000.  Since last visit he has been feeling well.  He has dose of exogenous steroids has been reduced and as a result he has been able to lose 18 pounds since last visit.  As a result of his weight loss his edema has improved and his exertional dyspnea has improved.  He is on exogenous steroids because of panhypopituitarism.  Current Outpatient Prescriptions  Medication Sig Dispense Refill  . aspirin 81 MG tablet Take 81 mg by mouth daily.        Marland Kitchen atenolol (TENORMIN) 25 MG tablet Take 25 mg by mouth daily.        . carvedilol (COREG) 6.25 MG tablet       . furosemide (LASIX) 40 MG tablet Take 20 mg by mouth as directed.        . hydrocortisone (CORTEF) 20 MG tablet Take 10 mg by mouth 2 (two) times daily.        . isosorbide mononitrate (IMDUR) 60 MG 24 hr tablet Take 60 mg by mouth daily.        Marland Kitchen levothyroxine (SYNTHROID, LEVOTHROID) 50 MCG tablet Take 50 mcg by mouth daily.        . multivitamin (THERAGRAN) per tablet Take 1 tablet by mouth daily.        Marland Kitchen omeprazole (PRILOSEC) 20 MG capsule       . potassium chloride SA (K-DUR,KLOR-CON) 20 MEQ tablet       . POTASSIUM PO Take 2 tablets by mouth daily.        . promethazine (PHENERGAN) 25 MG tablet       . RABEprazole (ACIPHEX) 20 MG tablet Take 1 tablet (20 mg total) by  mouth daily.  15 tablet  0  . ramipril (ALTACE) 2.5 MG tablet Take 2.5 mg by mouth daily.        . traZODone (DESYREL) 50 MG tablet       . Vitamin D, Cholecalciferol, 400 UNITS CHEW Chew 1 tablet by mouth daily.          Allergies  Allergen Reactions  . Penicillins     Patient Active Problem List  Diagnoses  . ADENOMATOUS COLONIC POLYP  . PANHYPOPITUITARISM  . DIABETES INSIPIDUS  . CARDIOMYOPATHY  . CHF, MILD  . HEMORRHOIDS, INTERNAL  . DIVERTICULOSIS, COLON  . ARTHRITIS  . WEIGHT LOSS-ABNORMAL  . HEADACHE  . NAUSEA AND VOMITING  . DIARRHEA, ACUTE  . ABDOMINAL PAIN, UNSPECIFIED SITE  . FECAL OCCULT BLOOD  . Coronary artery disease  . Hiatal hernia  . Hypothyroidism  . GERD (gastroesophageal reflux disease)  . Splenic flexure syndrome  . Exogenous obesity  . Nausea alone  . Esophageal reflux  . Gastritis  . Stricture of esophagus  . Barrett esophagus  . Bifascicular block    History  Smoking status  . Former Smoker  . Quit date: 01/11/1962  Smokeless tobacco  . Never Used    History  Alcohol Use No    Family History  Problem Relation Age of Onset  . Colon cancer Brother   . Cancer Sister     ?  . Stroke Father   . Skin cancer Mother     Review of Systems: Constitutional: no fever chills diaphoresis or fatigue or change in weight.  Head and neck: no hearing loss, no epistaxis, no photophobia or visual disturbance. Respiratory: No cough, shortness of breath or wheezing. Cardiovascular: No chest pain peripheral edema, palpitations. Gastrointestinal: No abdominal distention, no abdominal pain, no change in bowel habits hematochezia or melena. Genitourinary: No dysuria, no frequency, no urgency, no nocturia. Musculoskeletal:No arthralgias, no back pain, no gait disturbance or myalgias. Neurological: No dizziness, no headaches, no numbness, no seizures, no syncope, no weakness, no tremors. Hematologic: No lymphadenopathy, no easy  bruising. Psychiatric: No confusion, no hallucinations, no sleep disturbance.    Physical Exam: Filed Vitals:   02/10/11 1010  BP: 140/78  Pulse: 80   the general appearance reveals a well-developed elderly gentleman in no distress.  Pupils equal and reactive.   Extraocular Movements are full.  There is no scleral icterus.  The mouth and pharynx are normal.  The neck is supple.  The carotids reveal no bruits.  The jugular venous pressure is normal.  The thyroid is not enlarged.  There is no lymphadenopathy.  The chest is clear to percussion and auscultation. There are no rales or rhonchi. Expansion of the chest is symmetrical.  The precordium is quiet.  The first heart sound is normal.  The second heart sound is physiologically split.  There is no murmur gallop rub or click.  There is no abnormal lift or heave.  The abdomen is soft and nontender. Bowel sounds are normal. The liver and spleen are not enlarged. There Are no abdominal masses. There are no bruits.  Extremities reveal thick ankles and trace pretibial and ankle edema.The skin is warm and dry.  There is no rash. Strength is normal and symmetrical in all extremities.  There is no lateralizing weakness.  There are no sensory deficits.  EKG today shows normal sinus rhythm at 83 per minute and a pattern of bifascicular block, unchanged since prior tracing.   Assessment / Plan: The patient appears to be doing well at the present time.  He is to continue same cardiac medications and be rechecked in 6 months for followup office visit

## 2011-12-24 IMAGING — CR DG ABDOMEN ACUTE W/ 1V CHEST
4 series · 4 of 4 positions shown · non-contrast
Comparison: 01/05/2009

CLINICAL DATA: Abdominal pain with nausea.

ACUTE ABDOMEN SERIES (ABDOMEN 2 VIEW & CHEST 1 VIEW)

[w chest pa]
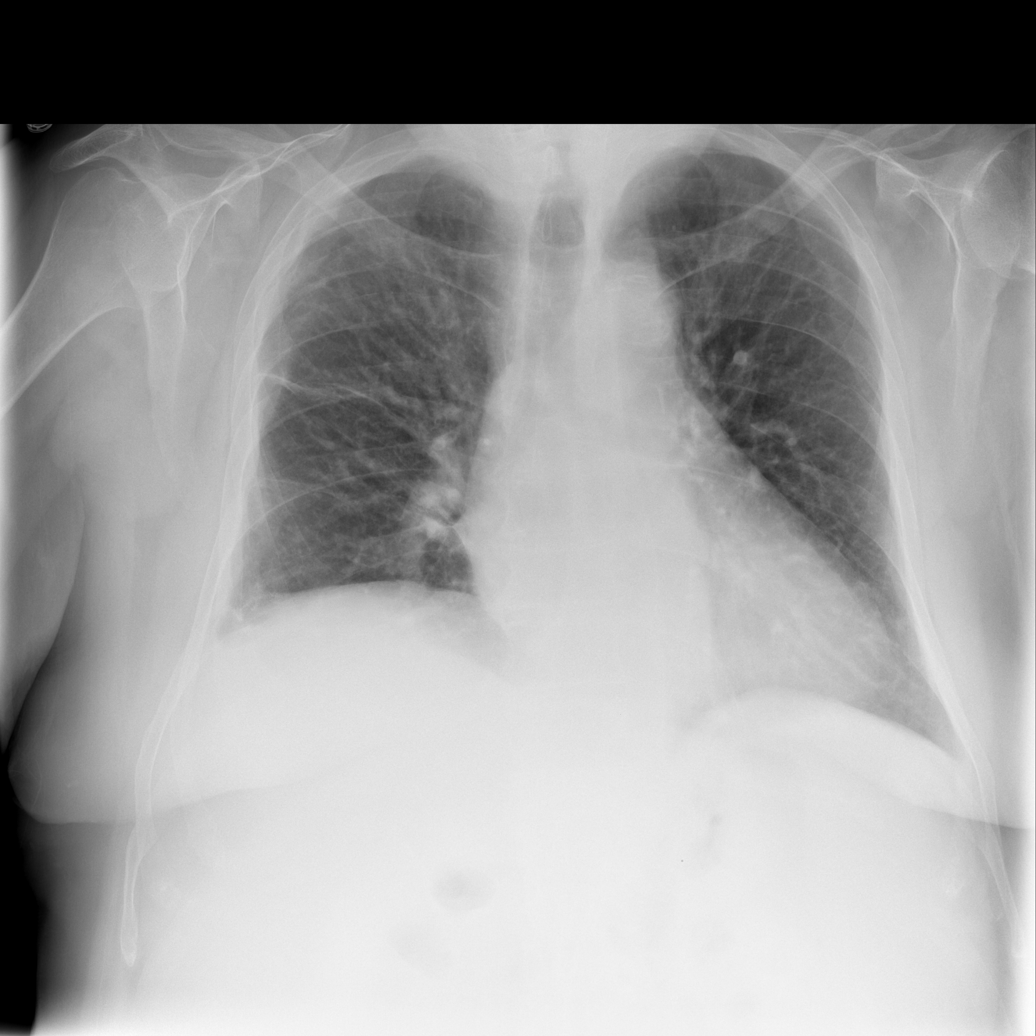

[w abdomen upright]
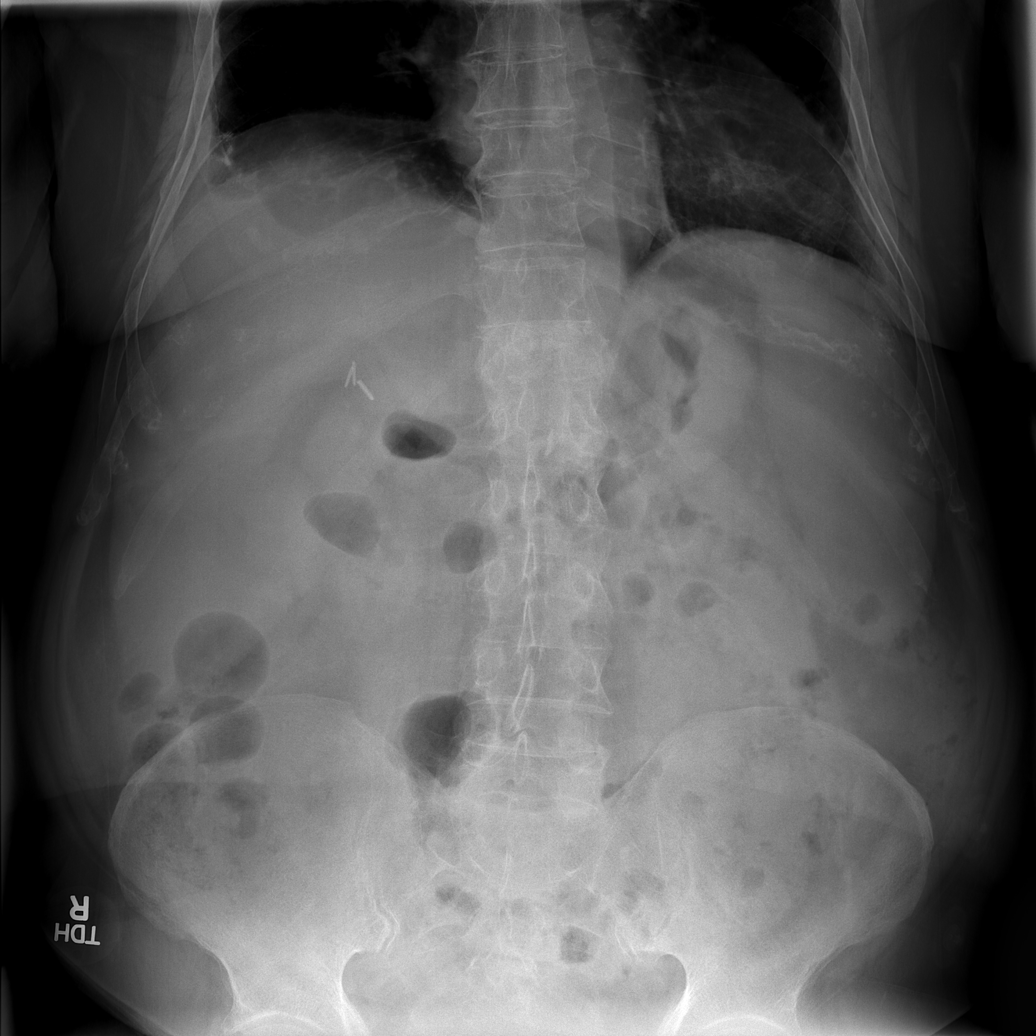

[t abdomen supine *]
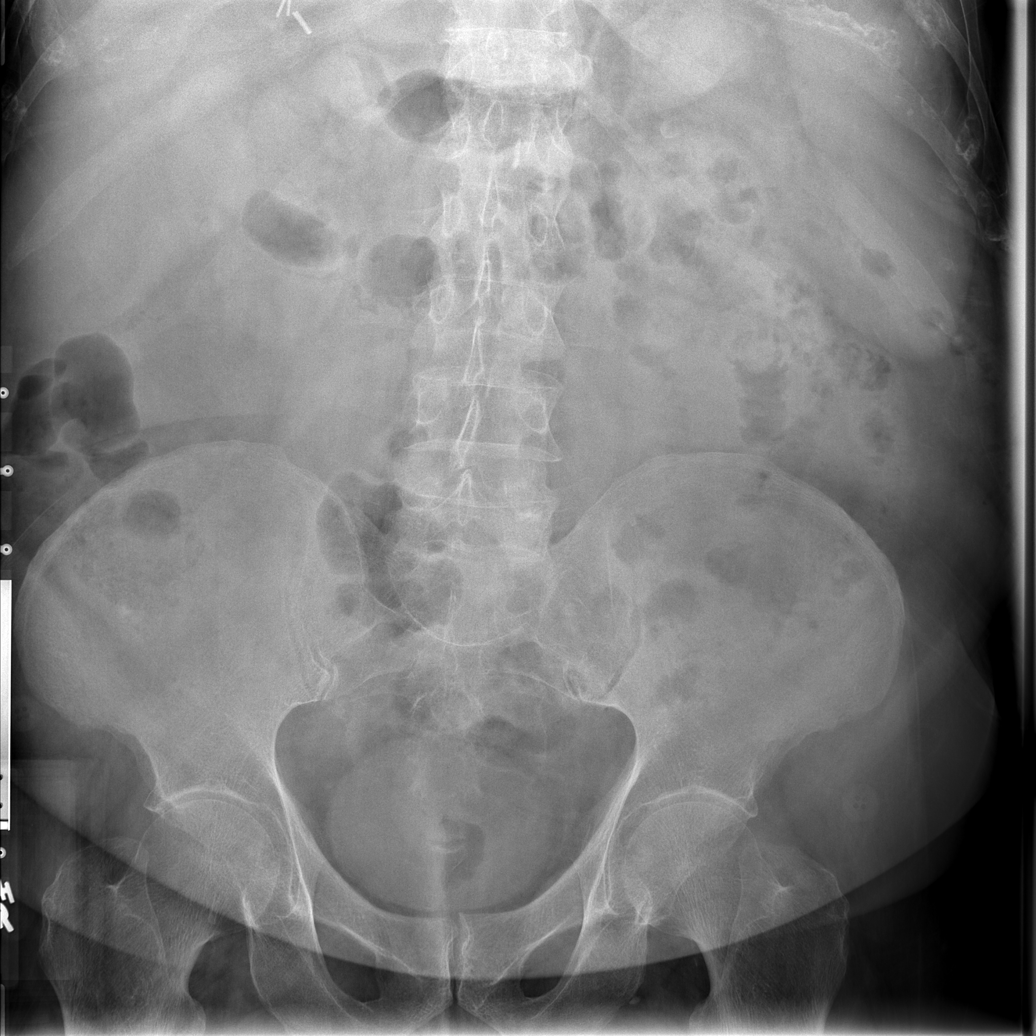

[t abdomen supine]
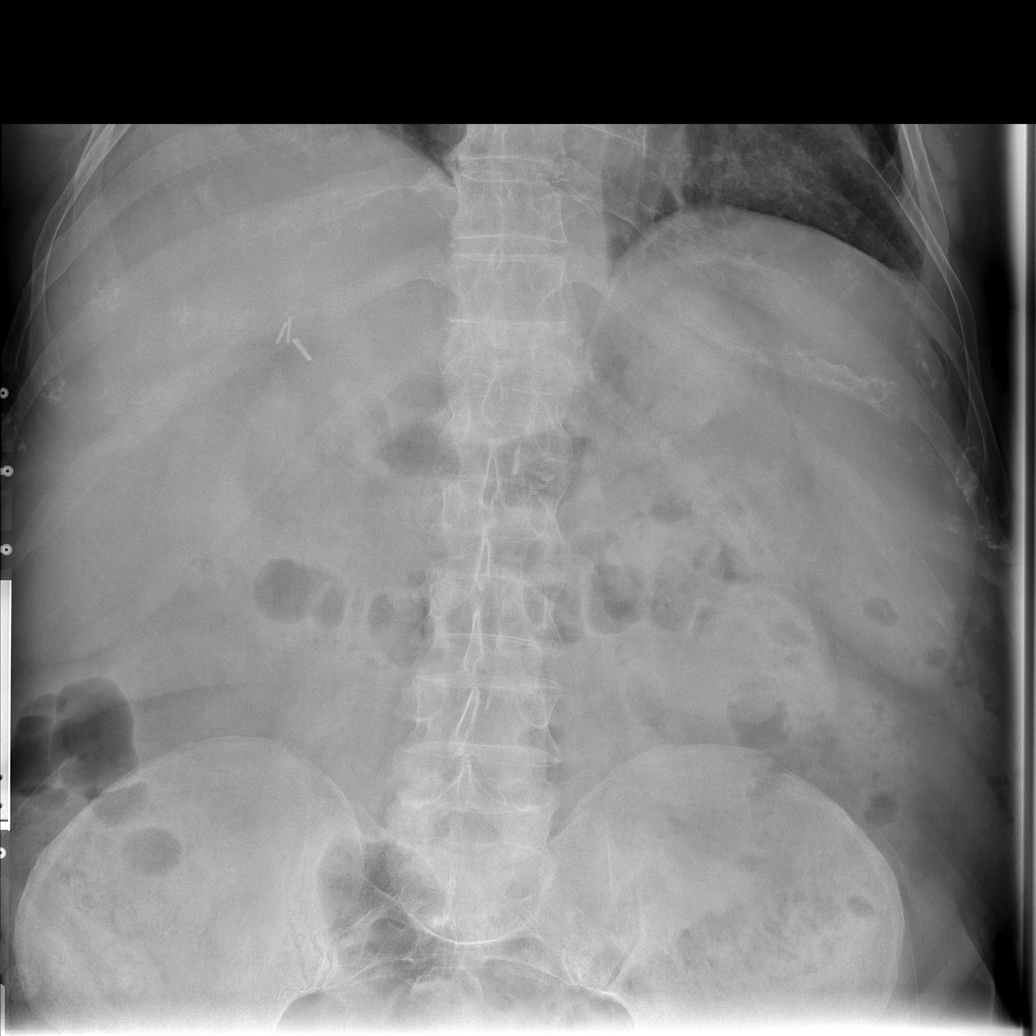

[4 of 4 positions shown; findings below may reference images not displayed]

FINDINGS: The cardiomediastinal silhouette is unremarkable.
Right mid lung scarring is again identified.
There is no evidence of airspace disease, pleural effusion or
pneumothorax.
Mild peribronchial thickening is stable.

The bowel gas pattern is unremarkable.
There is no evidence of bowel obstruction or pneumoperitoneum.
Cholecystectomy clips are identified.
No suspicious calcifications are present.
No acute bony abnormalities are noted.
IMPRESSION: No acute abnormalities.

Unremarkable bowel gas pattern.

## 2012-02-08 ENCOUNTER — Encounter: Payer: Self-pay | Admitting: Cardiology

## 2012-02-08 ENCOUNTER — Ambulatory Visit (INDEPENDENT_AMBULATORY_CARE_PROVIDER_SITE_OTHER): Payer: Medicare Other | Admitting: Cardiology

## 2012-02-08 VITALS — BP 133/70 | HR 68 | Resp 18 | Ht 67.0 in | Wt 244.0 lb

## 2012-02-08 DIAGNOSIS — I5032 Chronic diastolic (congestive) heart failure: Secondary | ICD-10-CM

## 2012-02-08 DIAGNOSIS — E6609 Other obesity due to excess calories: Secondary | ICD-10-CM

## 2012-02-08 DIAGNOSIS — E669 Obesity, unspecified: Secondary | ICD-10-CM

## 2012-02-08 DIAGNOSIS — I509 Heart failure, unspecified: Secondary | ICD-10-CM

## 2012-02-08 DIAGNOSIS — E232 Diabetes insipidus: Secondary | ICD-10-CM

## 2012-02-08 NOTE — Progress Notes (Signed)
Scott Mooney Date of Birth:  [birth year] [facility] [address] [place], [place]  [address] [phone]         Fax   [phone]  History of Present Illness: This pleasant 79 year old gentleman is seen for a scheduled  followup office visit.  He has a history of compensated diastolic congestive heart failure. His last echocardiogram on 05/07/09 showed an ejection fraction of 55-60% with normal systolic function and moderate LVH and with grade 2 diastolic dysfunction. The patient does not have any history of ischemic heart disease. He has not had cardiac catheterization. He did have a normal nuclear stress test at [facility] in 2000.  Since last visit the patient has gained 16 pounds.  He has not been to careful with his diet.  He does enjoy eating a lot of bread products.  Since we last saw him he reports that Dr. Eloise Harman started him on home oxygen after performing overnight oximetry which showed intermittent hypoxemia.  The patient states that he uses the home oxygen only sparingly and not on a regular basis.  Current Outpatient Prescriptions  Medication Sig Dispense Refill  . aspirin 81 MG tablet Take 81 mg by mouth daily.        Marland Kitchen atenolol (TENORMIN) 25 MG tablet Take 25 mg by mouth daily.        . carvedilol (COREG) 6.25 MG tablet       . furosemide (LASIX) 40 MG tablet Take 20 mg by mouth as directed.        . hydrocortisone (CORTEF) 20 MG tablet Take 10 mg by mouth 2 (two) times daily.        . isosorbide mononitrate (IMDUR) 60 MG 24 hr tablet Take 60 mg by mouth daily.        Marland Kitchen levothyroxine (SYNTHROID, LEVOTHROID) 50 MCG tablet Take 50 mcg by mouth daily.        . multivitamin (THERAGRAN) per tablet Take 1 tablet by mouth daily.        Marland Kitchen omeprazole (PRILOSEC) 20 MG capsule       . potassium chloride SA (K-DUR,KLOR-CON) 20 MEQ tablet       . POTASSIUM PO Take 2 tablets by mouth daily.        . promethazine (PHENERGAN) 25 MG tablet       . RABEprazole  (ACIPHEX) 20 MG tablet Take 20 mg by mouth daily.      . ramipril (ALTACE) 2.5 MG tablet Take 2.5 mg by mouth daily.        . traZODone (DESYREL) 50 MG tablet       . Vitamin D, Cholecalciferol, 400 UNITS CHEW Chew 1 tablet by mouth daily.          Allergies  Allergen Reactions  . Penicillins     Patient Active Problem List  Diagnosis  . ADENOMATOUS COLONIC POLYP  . PANHYPOPITUITARISM  . DIABETES INSIPIDUS  . CARDIOMYOPATHY  . CHF, MILD  . HEMORRHOIDS, INTERNAL  . DIVERTICULOSIS, COLON  . ARTHRITIS  . WEIGHT LOSS-ABNORMAL  . HEADACHE  . NAUSEA AND VOMITING  . DIARRHEA, ACUTE  . ABDOMINAL PAIN, UNSPECIFIED SITE  . FECAL OCCULT BLOOD  . Hiatal hernia  . Hypothyroidism  . GERD (gastroesophageal reflux disease)  . Splenic flexure syndrome  . Exogenous obesity  . Nausea alone  . Esophageal reflux  . Gastritis  . Stricture of esophagus  . Barrett esophagus  . Bifascicular block  . Chronic diastolic congestive heart  failure    History  Smoking status  . Former Smoker  . Quit date: 01/11/1962  Smokeless tobacco  . Never Used    History  Alcohol Use No    Family History  Problem Relation Age of Onset  . Colon cancer Brother   . Cancer Sister     ?  . Stroke Father   . Skin cancer Mother     Review of Systems: Constitutional: no fever chills diaphoresis or fatigue or change in weight.  Head and neck: no hearing loss, no epistaxis, no photophobia or visual disturbance. Respiratory: No cough, shortness of breath or wheezing. Cardiovascular: No chest pain peripheral edema, palpitations. Gastrointestinal: No abdominal distention, no abdominal pain, no change in bowel habits hematochezia or melena. Genitourinary: No dysuria, no frequency, no urgency, no nocturia. Musculoskeletal:No arthralgias, no back pain, no gait disturbance or myalgias. Neurological: No dizziness, no headaches, no numbness, no seizures, no syncope, no weakness, no tremors. Hematologic: No  lymphadenopathy, no easy bruising. Psychiatric: No confusion, no hallucinations, no sleep disturbance.    Physical Exam: Filed Vitals:   02/08/12 1125  BP: 133/70  Pulse: 68  Resp: 18   the general appearance reveals a large gentleman in no distress.The head and neck exam reveals pupils equal and reactive.  Extraocular movements are full.  There is no scleral icterus.  The mouth and pharynx are normal.  The neck is supple.  The carotids reveal no bruits.  The jugular venous pressure is normal.  The  thyroid is not enlarged.  There is no lymphadenopathy.  The chest is clear to percussion and auscultation.  There are no rales or rhonchi.  Expansion of the chest is symmetrical.  The precordium is quiet.  The first heart sound is normal.  The second heart sound is physiologically split.  There is no murmur gallop rub or click.  There is no abnormal lift or heave.  The abdomen is soft and nontender.  The bowel sounds are normal.  The liver and spleen are not enlarged.  There are no abdominal masses.  There are no abdominal bruits.  Extremities reveal good pedal pulses.  The legs and feet are very thick with only trace pretibial and pedal edema  There is no cyanosis or clubbing.  Strength is normal and symmetrical in all extremities.  There is no lateralizing weakness.  There are no sensory deficits.  The skin is warm and dry.  There is no rash.     Assessment / Plan: Patient is to continue same medication.  He did not bring his medication with him today and he was a little confused about some of his medications.  He appears to be on both atenolol and carvedilol according to our list. We will have him return soon for an update on his two-dimensional echocardiogram.  He will work harder to lose weight.  Recheck in 6 months for followup office visit and EKG.

## 2012-02-08 NOTE — Assessment & Plan Note (Signed)
The patient has been eating moderate amount of bread and biscuits.  His wife agrees that the patient eats too much.  They eat out in restaurants frequently.  We talked today about the importance of cutting back on total calories and in particular on cutting back on carbohydrates in order to lose weight.  It appears that his weight gain is due to gain of body fat rather than weight gain from peripheral edema

## 2012-02-08 NOTE — Patient Instructions (Addendum)
Your physician has requested that you have an echocardiogram. Echocardiography is a painless test that uses sound waves to create images of your heart. It provides your doctor with information about the size and shape of your heart and how well your heart's chambers and valves are working. This procedure takes approximately one hour. There are no restrictions for this procedure.  Work on losing [employer]  Your physician wants you to follow-up in: 6 month ov/ekg You will receive a reminder letter in the mail two months in advance. If you don't receive a letter, please call our office to schedule the follow-up appointment.   BRING ALL YOUR MEDICATIONS TO NEXT OFFICE VISIT

## 2012-02-08 NOTE — Assessment & Plan Note (Signed)
The patient has chronic diastolic congestive heart failure with preserved systolic function.  He sleeps on 2 pillows.  He has had some mild peripheral edema.  He is not having any paroxysmal nocturnal dyspnea.  He has nocturia x1.  He does have shortness of breath with exertion but some of this may be related to his exogenous obesity.

## 2012-02-08 NOTE — Assessment & Plan Note (Signed)
The patient has a history of previous panhypopituitarism and is on exogenous steroids and on exogenous Synthroid.  He is not having any problems with orthostatic hypotension or syncope

## 2012-02-11 ENCOUNTER — Telehealth: Payer: Self-pay | Admitting: Cardiology

## 2012-02-11 NOTE — Telephone Encounter (Signed)
Scott Mooney went into the home. Wife has a lot of dementia and she takes care of him per nurse. Nurse took a mini mental evaluation and she didn't do well, could only draw a circle without numbers when asked to draw a clock.  She gets his medication for him. He has oxygen but rarely wears it per nurse. She made him put it on. He has about 4 medications for blood pressure on list he is not taking. Blood pressure systolic 160-180. Patient is having a hard time breathing and 3+ edema.  When [facility] asked about the son was told he doesn't come around much. Will discuss further with  Dr. Patty Sermons

## 2012-02-11 NOTE — Telephone Encounter (Signed)
Left message to call back  

## 2012-02-11 NOTE — Telephone Encounter (Signed)
New problem:   Nurse from [facility] did not want to disclose any information stating she between patients.

## 2012-02-29 ENCOUNTER — Ambulatory Visit (HOSPITAL_COMMUNITY): Payer: Medicare Other | Attending: Cardiovascular Disease

## 2012-02-29 DIAGNOSIS — I428 Other cardiomyopathies: Secondary | ICD-10-CM | POA: Insufficient documentation

## 2012-02-29 DIAGNOSIS — I509 Heart failure, unspecified: Secondary | ICD-10-CM

## 2012-02-29 DIAGNOSIS — E669 Obesity, unspecified: Secondary | ICD-10-CM | POA: Insufficient documentation

## 2012-02-29 DIAGNOSIS — R609 Edema, unspecified: Secondary | ICD-10-CM | POA: Insufficient documentation

## 2012-02-29 DIAGNOSIS — R0989 Other specified symptoms and signs involving the circulatory and respiratory systems: Secondary | ICD-10-CM | POA: Insufficient documentation

## 2012-02-29 DIAGNOSIS — I080 Rheumatic disorders of both mitral and aortic valves: Secondary | ICD-10-CM | POA: Insufficient documentation

## 2012-02-29 DIAGNOSIS — I079 Rheumatic tricuspid valve disease, unspecified: Secondary | ICD-10-CM | POA: Insufficient documentation

## 2012-02-29 DIAGNOSIS — I5032 Chronic diastolic (congestive) heart failure: Secondary | ICD-10-CM

## 2012-02-29 DIAGNOSIS — R0609 Other forms of dyspnea: Secondary | ICD-10-CM | POA: Insufficient documentation

## 2012-02-29 NOTE — Progress Notes (Signed)
Echocardiogram performed.  

## 2012-03-03 NOTE — Telephone Encounter (Signed)
Discussed this with  Dr. Patty Sermons last week and patient will continue current medications and keep regularly scheduled appointment.

## 2012-03-08 ENCOUNTER — Telehealth: Payer: Self-pay | Admitting: *Deleted

## 2012-03-08 NOTE — Telephone Encounter (Signed)
Advised and sent to Dr Eloise Harman at [facility]

## 2012-03-08 NOTE — Telephone Encounter (Signed)
Message copied by Burnell Blanks on Wed Mar 08, 2012  9:47 AM ------      Message from: Cassell Clement      Created: Tue Feb 29, 2012  8:59 PM       Please report.  Echo shows normal systolic function and he has diastolic dysfunction. Continue to work on weight loss and good BP control.  Send copy of echo to PCP. ------

## 2012-05-15 ENCOUNTER — Inpatient Hospital Stay (HOSPITAL_COMMUNITY)
Admission: EM | Admit: 2012-05-15 | Discharge: 2012-05-18 | DRG: 292 | Disposition: A | Discharge status: Home / Self Care | Payer: Medicare Other | Attending: Cardiology | Admitting: Cardiology

## 2012-05-15 ENCOUNTER — Emergency Department (HOSPITAL_COMMUNITY): Payer: Medicare Other

## 2012-05-15 ENCOUNTER — Encounter (HOSPITAL_COMMUNITY): Payer: Self-pay | Admitting: Emergency Medicine

## 2012-05-15 DIAGNOSIS — D696 Thrombocytopenia, unspecified: Secondary | ICD-10-CM | POA: Diagnosis present

## 2012-05-15 DIAGNOSIS — K6389 Other specified diseases of intestine: Secondary | ICD-10-CM | POA: Diagnosis present

## 2012-05-15 DIAGNOSIS — E876 Hypokalemia: Secondary | ICD-10-CM | POA: Diagnosis not present

## 2012-05-15 DIAGNOSIS — I10 Essential (primary) hypertension: Secondary | ICD-10-CM | POA: Diagnosis present

## 2012-05-15 DIAGNOSIS — J209 Acute bronchitis, unspecified: Secondary | ICD-10-CM | POA: Diagnosis present

## 2012-05-15 DIAGNOSIS — Z823 Family history of stroke: Secondary | ICD-10-CM

## 2012-05-15 DIAGNOSIS — Z79899 Other long term (current) drug therapy: Secondary | ICD-10-CM

## 2012-05-15 DIAGNOSIS — F0391 Unspecified dementia with behavioral disturbance: Secondary | ICD-10-CM | POA: Diagnosis present

## 2012-05-15 DIAGNOSIS — I509 Heart failure, unspecified: Secondary | ICD-10-CM

## 2012-05-15 DIAGNOSIS — F05 Delirium due to known physiological condition: Secondary | ICD-10-CM | POA: Diagnosis present

## 2012-05-15 DIAGNOSIS — Z9183 Wandering in diseases classified elsewhere: Secondary | ICD-10-CM | POA: Diagnosis present

## 2012-05-15 DIAGNOSIS — E230 Hypopituitarism: Secondary | ICD-10-CM | POA: Diagnosis present

## 2012-05-15 DIAGNOSIS — R7301 Impaired fasting glucose: Secondary | ICD-10-CM | POA: Diagnosis present

## 2012-05-15 DIAGNOSIS — Z880 Allergy status to penicillin: Secondary | ICD-10-CM

## 2012-05-15 DIAGNOSIS — K449 Diaphragmatic hernia without obstruction or gangrene: Secondary | ICD-10-CM | POA: Diagnosis present

## 2012-05-15 DIAGNOSIS — J440 Chronic obstructive pulmonary disease with acute lower respiratory infection: Secondary | ICD-10-CM | POA: Diagnosis present

## 2012-05-15 DIAGNOSIS — K219 Gastro-esophageal reflux disease without esophagitis: Secondary | ICD-10-CM | POA: Diagnosis present

## 2012-05-15 DIAGNOSIS — K2270 Barrett's esophagus without dysplasia: Secondary | ICD-10-CM | POA: Diagnosis present

## 2012-05-15 DIAGNOSIS — Z808 Family history of malignant neoplasm of other organs or systems: Secondary | ICD-10-CM

## 2012-05-15 DIAGNOSIS — F03918 Unspecified dementia, unspecified severity, with other behavioral disturbance: Secondary | ICD-10-CM | POA: Diagnosis present

## 2012-05-15 DIAGNOSIS — K7689 Other specified diseases of liver: Secondary | ICD-10-CM | POA: Diagnosis present

## 2012-05-15 DIAGNOSIS — R7309 Other abnormal glucose: Secondary | ICD-10-CM | POA: Diagnosis present

## 2012-05-15 DIAGNOSIS — I452 Bifascicular block: Secondary | ICD-10-CM | POA: Diagnosis present

## 2012-05-15 DIAGNOSIS — I2510 Atherosclerotic heart disease of native coronary artery without angina pectoris: Secondary | ICD-10-CM | POA: Diagnosis present

## 2012-05-15 DIAGNOSIS — Z7982 Long term (current) use of aspirin: Secondary | ICD-10-CM

## 2012-05-15 DIAGNOSIS — E232 Diabetes insipidus: Secondary | ICD-10-CM | POA: Diagnosis present

## 2012-05-15 DIAGNOSIS — M109 Gout, unspecified: Secondary | ICD-10-CM | POA: Diagnosis present

## 2012-05-15 DIAGNOSIS — Z87891 Personal history of nicotine dependence: Secondary | ICD-10-CM

## 2012-05-15 DIAGNOSIS — I5033 Acute on chronic diastolic (congestive) heart failure: Principal | ICD-10-CM | POA: Diagnosis present

## 2012-05-15 DIAGNOSIS — Z800 Family history of malignant neoplasm of digestive organs: Secondary | ICD-10-CM

## 2012-05-15 DIAGNOSIS — J811 Chronic pulmonary edema: Secondary | ICD-10-CM | POA: Diagnosis present

## 2012-05-15 DIAGNOSIS — K5730 Diverticulosis of large intestine without perforation or abscess without bleeding: Secondary | ICD-10-CM | POA: Diagnosis present

## 2012-05-15 DIAGNOSIS — E669 Obesity, unspecified: Secondary | ICD-10-CM | POA: Diagnosis present

## 2012-05-15 DIAGNOSIS — E039 Hypothyroidism, unspecified: Secondary | ICD-10-CM | POA: Diagnosis present

## 2012-05-15 HISTORY — DX: Thrombocytopenia, unspecified: D69.6

## 2012-05-15 LAB — CBC WITH DIFFERENTIAL/PLATELET
Eosinophils Absolute: 0.1 10*3/uL (ref 0.0–0.7)
Eosinophils Relative: 1 % (ref 0–5)
HCT: 47 % (ref 39.0–52.0)
Lymphocytes Relative: 10 % — ABNORMAL LOW (ref 12–46)
Lymphs Abs: 1.1 10*3/uL (ref 0.7–4.0)
MCH: 26.9 pg (ref 26.0–34.0)
MCV: 79.1 fL (ref 78.0–100.0)
Monocytes Absolute: 0.6 10*3/uL (ref 0.1–1.0)
Monocytes Relative: 6 % (ref 3–12)
Platelets: 132 10*3/uL — ABNORMAL LOW (ref 150–400)
RBC: 5.94 MIL/uL — ABNORMAL HIGH (ref 4.22–5.81)
WBC: 11.4 10*3/uL — ABNORMAL HIGH (ref 4.0–10.5)

## 2012-05-15 LAB — POCT I-STAT TROPONIN I: Troponin i, poc: 0 ng/mL (ref 0.00–0.08)

## 2012-05-15 LAB — POCT I-STAT, CHEM 8
BUN: 9 mg/dL (ref 6–23)
Chloride: 104 mEq/L (ref 96–112)
Creatinine, Ser: 0.9 mg/dL (ref 0.50–1.35)
Potassium: 4 mEq/L (ref 3.5–5.1)
Sodium: 140 mEq/L (ref 135–145)

## 2012-05-15 MED ORDER — ALBUTEROL SULFATE (5 MG/ML) 0.5% IN NEBU
5.0000 mg | INHALATION_SOLUTION | Freq: Once | RESPIRATORY_TRACT | Status: AC
  Administered 2012-05-15: 5 mg via RESPIRATORY_TRACT
  Filled 2012-05-15: qty 1

## 2012-05-15 MED ORDER — NITROGLYCERIN 2 % TD OINT
1.0000 [in_us] | TOPICAL_OINTMENT | Freq: Once | TRANSDERMAL | Status: AC
  Administered 2012-05-15: 1 [in_us] via TOPICAL
  Filled 2012-05-15: qty 1

## 2012-05-15 MED ORDER — FUROSEMIDE 10 MG/ML IJ SOLN
20.0000 mg | Freq: Once | INTRAMUSCULAR | Status: AC
  Administered 2012-05-15: 20 mg via INTRAVENOUS
  Filled 2012-05-15: qty 2

## 2012-05-15 MED ORDER — IPRATROPIUM BROMIDE 0.02 % IN SOLN
0.5000 mg | Freq: Once | RESPIRATORY_TRACT | Status: AC
  Administered 2012-05-15: 0.5 mg via RESPIRATORY_TRACT
  Filled 2012-05-15: qty 2.5

## 2012-05-15 NOTE — ED Notes (Signed)
Lab at bedside

## 2012-05-15 NOTE — ED Notes (Signed)
NP at bedside.

## 2012-05-15 NOTE — ED Notes (Signed)
PT returned from Xray 

## 2012-05-15 NOTE — ED Provider Notes (Signed)
History     CSN: [id number]  Arrival date & time 05/15/12  1949   None     Chief Complaint  Patient presents with  . Sore Throat  . Cough    (Consider location/radiation/quality/duration/timing/severity/associated sxs/prior treatment) HPI Comments: Patient reports, that he, thinks he's had a sore throat for the past 2, days, with a cough.  He reports, that he was seen by his doctor today and given a shot, but still doesn't feel better.  She doesn't feel that he was adequately diagnosed by his primary care physician.  He was given prescriptions for unknown product, which he, states is filled but hasn't started taking yet.  His wife was at the bedside is equally as uninformed and cannot add to this history at all.  He does not know what medication.  He takes he cannot tell if he takes his medication on a regular basis.  Patient is a 79 y.o. male presenting with pharyngitis and cough. The history is provided by the patient. The history is limited by the condition of the patient.  Sore Throat This is a new problem. The current episode started in the past 7 days. The problem occurs constantly. Associated symptoms include chills and coughing. Pertinent negatives include no chest pain or vomiting.  Cough Associated symptoms: chills and shortness of breath   Associated symptoms: no chest pain     Past Medical History  Diagnosis Date  . Diastolic dysfunction   . History of pituitary tumor   . Pituitary insufficiency   . Hypothyroidism   . GERD (gastroesophageal reflux disease)   . Splenic flexure syndrome   . CHF (congestive heart failure)     with diastolic dysfunction  . Hiatal hernia   . Coronary artery disease   . Cardiomyopathy   . Diverticulitis     recurrent  . Nephrolithiasis     right-sided  . Fatty liver   . Gout   . Arthritis   . Exogenous obesity   . Personal history of colonic polyps 07/23/2008    TUBULAR ADENOMA  . Ureteral stone     distal ureteral stone      Past Surgical History  Procedure Laterality Date  . Transphenoidal pituitary resection  2001  . Cholecystectomy, laparoscopic  2004  . Esophageal dilation  12/2006    endoscopy with dilatation of esophageal stricture    Family History  Problem Relation Age of Onset  . Colon cancer Brother   . Cancer Sister     ?  . Stroke Father   . Skin cancer Mother     History  Substance Use Topics  . Smoking status: Former Smoker    Quit date: 01/11/1962  . Smokeless tobacco: Never Used  . Alcohol Use: No      Review of Systems  Constitutional: Positive for chills.  Respiratory: Positive for cough and shortness of breath.   Cardiovascular: Negative for chest pain.  Gastrointestinal: Negative for vomiting.  All other systems reviewed and are negative.    Allergies  Penicillins  Home Medications   Current Outpatient Rx  Name  Route  Sig  Dispense  Refill  . aspirin 81 MG tablet   Oral   Take 81 mg by mouth daily.           . hydrocortisone (CORTEF) 20 MG tablet   Oral   Take 10 mg by mouth 2 (two) times daily.           . isosorbide mononitrate (IMDUR)  60 MG 24 hr tablet   Oral   Take 60 mg by mouth daily.           Marland Kitchen levothyroxine (SYNTHROID, LEVOTHROID) 50 MCG tablet   Oral   Take 50 mcg by mouth daily.           . multivitamin (THERAGRAN) per tablet   Oral   Take 1 tablet by mouth daily.           . potassium chloride SA (K-DUR,KLOR-CON) 20 MEQ tablet                 BP 143/62  Pulse 91  Temp(Src) 99.1 F (37.3 C) (Oral)  Resp 30  SpO2 96%  Physical Exam  Nursing note and vitals reviewed. Constitutional: He appears well-developed.   obese  HENT:  Head: Normocephalic and atraumatic.  Right Ear: External ear normal.  Left Ear: External ear normal.  Cardiovascular: Normal rate and regular rhythm.   Pulmonary/Chest: Effort normal. No respiratory distress. He has wheezes.  Abdominal: Soft. Bowel sounds are normal.   Musculoskeletal: Normal range of motion. He exhibits edema.  Lymphadenopathy:    He has no cervical adenopathy.  Neurological: He is alert.  Patient is very poor historian  Skin: Skin is warm and dry.    ED Course  Procedures (including critical care time)  Labs Reviewed  CBC WITH DIFFERENTIAL - Abnormal; Notable for the following:    WBC 11.4 (*)    RBC 5.94 (*)    RDW 19.6 (*)    Platelets 132 (*)    Neutrophils Relative 83 (*)    Neutro Abs 9.5 (*)    Lymphocytes Relative 10 (*)    All other components within normal limits  POCT I-STAT, CHEM 8 - Abnormal; Notable for the following:    Glucose, Bld 151 (*)    Calcium, Ion 1.09 (*)    Hemoglobin 17.3 (*)    All other components within normal limits  PRO B NATRIURETIC PEPTIDE  POCT I-STAT TROPONIN I   Dg Chest 2 View  05/15/2012  *RADIOLOGY REPORT*  Clinical Data: Sore throat and cough  CHEST - 2 VIEW  Comparison: Chest radiograph 07/04/2010, CT 07/04/2010  Findings: Stable enlarged cardiac silhouette.  There is interstitial edema pattern which is similar to comparison exam.  A small amount fluid along the horizontal fissures.  Low lung volumes.  There is a chronic compression fracture of the lower thoracic spine again demonstrated.  IMPRESSION: Interstitial edema and cardiomegaly suggest congestive heart failure   Original Report Authenticated By: Genevive Bi, M.D.      1. CHF (congestive heart failure)       MDM   There is no record of a visit with a primary care physician, that is associated with hours or is followed by Dr. Ivery Quale, so it is unknown exactly when he was seen in the office, or what he was given  Dr. Peggye Form, responded to consult.  He does not feel that this is a medical medicine admission he's asked that I consult with his cardiologist, who is Dr. Patty Sermons for admission      Arman Filter, NP 05/15/12 2319

## 2012-05-15 NOTE — H&P (Addendum)
Physician History and Physical    Scott Mooney MRN: [id number] DOB/AGE: January 12, 1930 79 y.o. Admit date: 05/15/2012  Primary Care Physician: Dr. Eloise Mooney Primary Cardiologist: Dr. Patty Mooney  HPI:  79 yo with history of obesity, diastolic CHF, and panhypopituitarism presented to the ER with shortness of breath.  Patient has had recent significant weight gain.  He does not follow a particularly good diet in terms of sodium restriction.  Last echo in 2/14 showed preserved EF with evidence for diastolic dysfunction.    Scott Mooney is a difficult historian.  He reports that he has been coughing and wheezing "nonstop" for the last 2 days.  It seems that he has had exertional dyspnea and orthopnea for longer than this, however.  He is short of breath just walking around his house.  No chest pain.  No fever. He went to his PCP's office today and got a shot (? Antibiotics) and was given prednisone apparently for wheezing.  In the ER, he has been wheezing and profoundly dyspneic with any exertion.  Oxygen saturation was low on room air.  CXR showed pulmonary edema.    PMH: 1. Diastolic CHF: Patient is on home oxygen at night.  Last echo (2/14) showed EF 65-70%, moderate LVH, moderate diastolic dysfunction, SAM with mild to moderate MR, PA systolic pressure 36 mmHg.  2. Normal nuclear stress test at [facility] in 2000.  No history of CAD.  3. GERD/hiatal hernia.  History of Barretts esophagus and esophageal stricture.  4. HTN 5. Panhypopituitarism and diabetes insipidus. 6. Diverticulosis 7. Splenic flexure syndrome 8. Obesity 9  Bifascicular block  Review of systems complete and found to be negative unless listed above   No current facility-administered medications for this encounter.   Current Outpatient Prescriptions  Medication Sig Dispense Refill  . aspirin 81 MG tablet Take 81 mg by mouth daily.        . hydrocortisone (CORTEF) 20 MG tablet Take 10 mg by mouth 2 (two) times daily.        .  isosorbide mononitrate (IMDUR) 60 MG 24 hr tablet Take 60 mg by mouth daily.        Marland Kitchen levothyroxine (SYNTHROID, LEVOTHROID) 50 MCG tablet Take 50 mcg by mouth daily.        . multivitamin (THERAGRAN) per tablet Take 1 tablet by mouth daily.        . potassium chloride SA (K-DUR,KLOR-CON) 20 MEQ tablet          Family History  Problem Relation Age of Onset  . Colon cancer Brother   . Cancer Sister     ?  . Stroke Father   . Skin cancer Mother     History   Social History  . Marital Status: Married    Spouse Name: N/A    Number of Children: 1  . Years of Education: N/A   Occupational History  . retired    Social History Main Topics  . Smoking status: Former Smoker    Quit date: 01/11/1962  . Smokeless tobacco: Never Used  . Alcohol Use: No  . Drug Use: No  . Sexually Active: Not on file   Other Topics Concern  . Not on file   Social History Narrative  . No narrative on file    Physical Exam: Blood pressure 146/49, pulse 87, temperature 99.1 F (37.3 C), temperature source Oral, resp. rate 30, SpO2 98.00%.  General: NAD Neck: JVP 10-12 cm, no thyromegaly or thyroid nodule.  Lungs: Crackles at bases and wheezes bilaterally.  CV: Nondisplaced PMI.  Heart regular S1/S2, no S3/S4, 1/6 HSM lower sternal border.  1+ edema 1/3 up lower legs bilaterally.  No carotid bruit.  Normal pedal pulses.  Abdomen: Soft, nontender, no hepatosplenomegaly, no distention.  Skin: Intact without lesions or rashes.  Neurologic: Alert and oriented x 3.  Psych: Normal affect. Extremities: No clubbing or cyanosis.  HEENT: Normal.   Labs:   Lab Results  Component Value Date   WBC 11.4* 05/15/2012   HGB 17.3* 05/15/2012   HCT 51.0 05/15/2012   MCV 79.1 05/15/2012   PLT 132* 05/15/2012    Recent Labs Lab 05/15/12 2146  NA 140  K 4.0  CL 104  BUN 9  CREATININE 0.90  GLUCOSE 151*  TnI 0 ProBNP 164    Radiology: - CXR: interstitial edema  EKG: Unchanged per report from ER but cannot  find ER ECG and it is not yet scanned in.   ASSESSMENT AND PLAN:   79 yo with history of obesity, diastolic CHF, and panhypopituitarism presented to the ER with shortness of breath.  By exam and CXR, he appears to be volume overloaded.   1. CHF: Suspect acute on chronic diastolic CHF with volume overload on exam and pulmonary edema on CXR.  He feels better after a dose of IV Lasix in the ER but is still dyspneic with any activity.  He may have built up the fluid gradually over time.  It does not appear that he has been taking Lasix regularly.  His diet seems high in sodium.  He has not had chest pain.  - Cycle cardiac enzymes to rule out ACS as cause of exacerbation, also need to get ECG.  - Lasix 40 mg IV every 8 hrs - NTG gtt for decongestion and given elevated BP.  - beta blocker (Coreg) with diastolic CHF and history of mitral valve SAM.  2. ID: Mildly elevated WBCs.  Cannot rule out underlying acute bronchitis with coughing and wheezing. I will treat with 7 days of levofloxacin.  Albuterol nebs prn wheezing.  3. Panhypopituitarism: BP stable, actually high.  Will continue home doses of hydrocortisone and levoxyl, no stress dose steroids for now. History of diabetes insipidus but Na level ok. Check TSH.   Signed: Marca Ancona 05/15/2012, 11:53 PM

## 2012-05-15 NOTE — ED Notes (Signed)
Pt c/o sore throat X 2 days, was seen at PCP yesterday and given a shot there and antibiotic. Pt hasn't started the antibiotic and wanted to be evaluated here. Pt in nad, skin warm an dry, resp e/u. Pt denies hx of asthma. Pt coughing and wheezing. Pt sts he received prednisone. Pt coming from home. BP 140 palpated HR 94 RR 18 92% on room air.

## 2012-05-15 NOTE — ED Notes (Signed)
Pt having difficulty moving up in bed, immediately became more sob, tachypnea and diaphoretic.

## 2012-05-15 NOTE — ED Notes (Signed)
Attempted to gain IV access, unsuccessful. Another RN will attempt. 

## 2012-05-15 NOTE — ED Notes (Signed)
Attempted to ambulate pt, pt unable to get out of bed on own, barely able to stand up. Pt reports increase in sob while doing this.

## 2012-05-16 LAB — BASIC METABOLIC PANEL
BUN: 10 mg/dL (ref 6–23)
Calcium: 8.7 mg/dL (ref 8.4–10.5)
Chloride: 100 mEq/L (ref 96–112)
Creatinine, Ser: 0.95 mg/dL (ref 0.50–1.35)
GFR calc Af Amer: 88 mL/min — ABNORMAL LOW (ref >90)

## 2012-05-16 LAB — CBC
HCT: 46.4 % (ref 39.0–52.0)
MCH: 26.2 pg (ref 26.0–34.0)
MCV: 79.3 fL (ref 78.0–100.0)
Platelets: 141 10*3/uL — ABNORMAL LOW (ref 150–400)
RDW: 19.7 % — ABNORMAL HIGH (ref 11.5–15.5)
WBC: 12.6 10*3/uL — ABNORMAL HIGH (ref 4.0–10.5)

## 2012-05-16 LAB — MRSA PCR SCREENING: MRSA by PCR: NEGATIVE

## 2012-05-16 LAB — TROPONIN I: Troponin I: <0.3 ng/mL (ref <0.30)

## 2012-05-16 MED ORDER — FUROSEMIDE 10 MG/ML IJ SOLN
40.0000 mg | Freq: Three times a day (TID) | INTRAMUSCULAR | Status: DC
Start: 2012-05-16 — End: 2012-05-17
  Administered 2012-05-16 – 2012-05-17 (×4): 40 mg via INTRAVENOUS
  Filled 2012-05-16 (×7): qty 4

## 2012-05-16 MED ORDER — SODIUM CHLORIDE 0.9 % IJ SOLN: 3.0000 mL | INTRAMUSCULAR | Status: DC | PRN

## 2012-05-16 MED ORDER — ALBUTEROL SULFATE (5 MG/ML) 0.5% IN NEBU: 2.5000 mg | INHALATION_SOLUTION | RESPIRATORY_TRACT | Status: DC | PRN

## 2012-05-16 MED ORDER — CARVEDILOL 6.25 MG PO TABS
6.2500 mg | ORAL_TABLET | Freq: Two times a day (BID) | ORAL | Status: DC
  Administered 2012-05-16 – 2012-05-18 (×5): 6.25 mg via ORAL
  Filled 2012-05-16 (×8): qty 1

## 2012-05-16 MED ORDER — ASPIRIN 81 MG PO TABS: 81.0000 mg | ORAL_TABLET | Freq: Every day | ORAL | Status: DC

## 2012-05-16 MED ORDER — HEPARIN SODIUM (PORCINE) 5000 UNIT/ML IJ SOLN
5000.0000 [IU] | Freq: Three times a day (TID) | INTRAMUSCULAR | Status: DC
  Administered 2012-05-16 – 2012-05-17 (×6): 5000 [IU] via SUBCUTANEOUS
  Filled 2012-05-16 (×10): qty 1

## 2012-05-16 MED ORDER — ISOSORBIDE MONONITRATE ER 60 MG PO TB24
60.0000 mg | ORAL_TABLET | Freq: Every day | ORAL | Status: DC
  Administered 2012-05-16 – 2012-05-18 (×3): 60 mg via ORAL
  Filled 2012-05-16 (×3): qty 1

## 2012-05-16 MED ORDER — POTASSIUM CHLORIDE CRYS ER 20 MEQ PO TBCR
40.0000 meq | EXTENDED_RELEASE_TABLET | Freq: Once | ORAL | Status: AC
  Administered 2012-05-16: 40 meq via ORAL
  Filled 2012-05-16: qty 2

## 2012-05-16 MED ORDER — HYDROCORTISONE 10 MG PO TABS
10.0000 mg | ORAL_TABLET | Freq: Two times a day (BID) | ORAL | Status: DC
  Administered 2012-05-16 – 2012-05-18 (×5): 10 mg via ORAL
  Filled 2012-05-16 (×6): qty 1

## 2012-05-16 MED ORDER — ONDANSETRON HCL 4 MG/2ML IJ SOLN: 4.0000 mg | Freq: Four times a day (QID) | INTRAMUSCULAR | Status: DC | PRN

## 2012-05-16 MED ORDER — NITROGLYCERIN 0.4 MG SL SUBL: 0.4000 mg | SUBLINGUAL_TABLET | SUBLINGUAL | Status: DC | PRN

## 2012-05-16 MED ORDER — SODIUM CHLORIDE 0.9 % IV SOLN: 250.0000 mL | INTRAVENOUS | Status: DC | PRN

## 2012-05-16 MED ORDER — HYDROCORTISONE 10 MG PO TABS: 10.0000 mg | ORAL_TABLET | Freq: Two times a day (BID) | ORAL | Status: DC

## 2012-05-16 MED ORDER — ACETAMINOPHEN 325 MG PO TABS: 650.0000 mg | ORAL_TABLET | ORAL | Status: DC | PRN

## 2012-05-16 MED ORDER — SODIUM CHLORIDE 0.9 % IJ SOLN
3.0000 mL | Freq: Two times a day (BID) | INTRAMUSCULAR | Status: DC
  Administered 2012-05-16 – 2012-05-17 (×3): 3 mL via INTRAVENOUS

## 2012-05-16 MED ORDER — LEVOFLOXACIN 500 MG PO TABS
500.0000 mg | ORAL_TABLET | Freq: Every day | ORAL | Status: DC
  Administered 2012-05-16 – 2012-05-18 (×3): 500 mg via ORAL
  Filled 2012-05-16 (×4): qty 1

## 2012-05-16 MED ORDER — IPRATROPIUM BROMIDE 0.02 % IN SOLN
0.5000 mg | Freq: Four times a day (QID) | RESPIRATORY_TRACT | Status: DC
  Administered 2012-05-16 – 2012-05-17 (×5): 0.5 mg via RESPIRATORY_TRACT
  Filled 2012-05-16 (×5): qty 2.5

## 2012-05-16 MED ORDER — LEVOTHYROXINE SODIUM 50 MCG PO TABS
50.0000 ug | ORAL_TABLET | Freq: Every day | ORAL | Status: DC
  Administered 2012-05-16 – 2012-05-18 (×3): 50 ug via ORAL
  Filled 2012-05-16 (×5): qty 1

## 2012-05-16 MED ORDER — ALBUTEROL SULFATE (5 MG/ML) 0.5% IN NEBU
2.5000 mg | INHALATION_SOLUTION | Freq: Four times a day (QID) | RESPIRATORY_TRACT | Status: DC
  Administered 2012-05-16 – 2012-05-17 (×5): 2.5 mg via RESPIRATORY_TRACT
  Filled 2012-05-16 (×5): qty 0.5

## 2012-05-16 MED ORDER — ASPIRIN EC 81 MG PO TBEC
81.0000 mg | DELAYED_RELEASE_TABLET | Freq: Every day | ORAL | Status: DC
  Administered 2012-05-16 – 2012-05-18 (×3): 81 mg via ORAL
  Filled 2012-05-16 (×3): qty 1

## 2012-05-16 MED ORDER — NITROGLYCERIN IN D5W 200-5 MCG/ML-% IV SOLN
2.0000 ug/min | INTRAVENOUS | Status: DC
Start: 2012-05-16 — End: 2012-05-18
  Administered 2012-05-16: 2 ug/min via INTRAVENOUS
  Filled 2012-05-16: qty 250

## 2012-05-16 NOTE — ED Provider Notes (Signed)
Medical screening examination/treatment/procedure(s) were conducted as a shared visit with non-physician practitioner(s) and myself.  I personally evaluated the patient during the encounter  [facility]oney is a 79 y.o. male hx of CHF, HTN her ewith SOB. SOB for the last few days, worse today. Given an abx shot by PMD in the morning. However, unable to sleep tonight due to SOB. Exam showed + crackles on bilateral bases and 2+ edema bilateral legs. I think he is in CHF exacerbation. He was given lasix in the ED and admitted for CHF exacerbation. Dr. Peggye Form, PMD, was called initially, but requested cardiology admission. Patient admitted to cardiology.   Richardean Canal, MD 05/16/12 2122

## 2012-05-16 NOTE — Progress Notes (Signed)
Patient ID: Scott Mooney, male   DOB: 19-Dec-1929, 79 y.o.   MRN: [id number]    SUBJECTIVE: Breathing better but still some dyspnea.  He has been getting IV Lasix.  Still wheezing but improved.   ECG: NSR, LAFB, RBBB (similar to prior)  . albuterol  2.5 mg Nebulization Q6H  . aspirin EC  81 mg Oral Daily  . carvedilol  6.25 mg Oral BID WC  . furosemide  40 mg Intravenous Q8H  . heparin  5,000 Units Subcutaneous Q8H  . hydrocortisone  10 mg Oral BID  . ipratropium  0.5 mg Nebulization Q6H  . isosorbide mononitrate  60 mg Oral Daily  . levofloxacin  500 mg Oral Daily  . levothyroxine  50 mcg Oral QAC breakfast  . sodium chloride  3 mL Intravenous Q12H      Filed Vitals:   05/15/12 2318 05/15/12 2330 05/16/12 0200 05/16/12 0400  BP:  146/49 142/58 128/43  Pulse: 89 87 90 88  Temp:   98.8 F (37.1 C) 98.6 F (37 C)  TempSrc:   Oral Oral  Resp:   32 28  Height:   5\' 4"  (1.626 m)   Weight:   244 lb 0.8 oz (110.7 kg)   SpO2: 96% 98%      Intake/Output Summary (Last 24 hours) at 05/16/12 0727 Last data filed at 05/16/12 0208  Gross per 24 hour  Intake      0 ml  Output    675 ml  Net   -675 ml    LABS: Basic Metabolic Panel:  Recent Labs  91/47/82 2146  NA 140  K 4.0  CL 104  GLUCOSE 151*  BUN 9  CREATININE 0.90   Liver Function Tests: No results found for this basename: AST, ALT, ALKPHOS, BILITOT, PROT, ALBUMIN,  in the last 72 hours No results found for this basename: LIPASE, AMYLASE,  in the last 72 hours CBC:  Recent Labs  05/15/12 2116 05/15/12 2146  WBC 11.4*  --   NEUTROABS 9.5*  --   HGB 16.0 17.3*  HCT 47.0 51.0  MCV 79.1  --   PLT 132*  --    Cardiac Enzymes:  Recent Labs  05/16/12 0120  TROPONINI <0.30   BNP: No components found with this basename: POCBNP,  D-Dimer: No results found for this basename: DDIMER,  in the last 72 hours Hemoglobin A1C: No results found for this basename: HGBA1C,  in the last 72 hours Fasting Lipid  Panel: No results found for this basename: CHOL, HDL, LDLCALC, TRIG, CHOLHDL, LDLDIRECT,  in the last 72 hours Thyroid Function Tests: No results found for this basename: TSH, T4TOTAL, FREET3, T3FREE, THYROIDAB,  in the last 72 hours Anemia Panel: No results found for this basename: VITAMINB12, FOLATE, FERRITIN, TIBC, IRON, RETICCTPCT,  in the last 72 hours  RADIOLOGY: Dg Chest 2 View  05/15/2012  *RADIOLOGY REPORT*  Clinical Data: Sore throat and cough  CHEST - 2 VIEW  Comparison: Chest radiograph 07/04/2010, CT 07/04/2010  Findings: Stable enlarged cardiac silhouette.  There is interstitial edema pattern which is similar to comparison exam.  A small amount fluid along the horizontal fissures.  Low lung volumes.  There is a chronic compression fracture of the lower thoracic spine again demonstrated.  IMPRESSION: Interstitial edema and cardiomegaly suggest congestive heart failure   Original Report Authenticated By: Genevive Bi, M.D.     PHYSICAL EXAM General: NAD Neck: JVP 10+ cm, no thyromegaly or thyroid nodule.  Lungs:  Crackles at the bases, end expiratory wheezes (mild this morning). CV: Nondisplaced PMI.  Heart regular S1/S2, no S3/S4, no murmur.  1+ edema 1/3 up bilaterally.  No carotid bruit.    Abdomen: Soft, nontender, no hepatosplenomegaly, no distention.  Neurologic: Alert and oriented x 3.  Psych: Normal affect. Extremities: No clubbing or cyanosis.   TELEMETRY: Reviewed telemetry pt in NSR  ASSESSMENT AND PLAN: 79 yo with history of obesity, diastolic CHF, and panhypopituitarism presented to the ER with shortness of breath. By exam and CXR, he appears to be volume overloaded.  1. CHF: Suspect acute on chronic diastolic CHF with volume overload on exam and pulmonary edema on CXR.  He may have built up the fluid gradually over time. It does not appear that he has been taking Lasix regularly. His diet seems high in sodium. He has not had chest pain.  - Cycle cardiac enzymes  to rule out ACS as cause of exacerbation => so far negative.  - Lasix 40 mg IV every 8 hrs to continue today.  - NTG gtt for decongestion and given elevated BP (titrate up as tolerated).  - beta blocker (Coreg) with diastolic CHF and history of mitral valve SAM.  2. ID: Mildly elevated WBCs. Cannot rule out underlying acute bronchitis with coughing and wheezing. I will treat with 7 days of levofloxacin.  3. Panhypopituitarism: BP stable, actually high. Will continue home doses of hydrocortisone and levoxyl, no stress dose steroids for now. History of diabetes insipidus but Na level ok. Check TSH.  4. Pulmonary: Suspect CHF possibly superimposed on acute bronchitis.  No definite PNA on CXR.  He is on levofloxacin and Lasix.  He has some wheezing/bronchospasm.  Will schedule albuterol/atrovent nebs for now.   Marca Ancona 05/16/2012 7:31 AM

## 2012-05-16 NOTE — Progress Notes (Signed)
Utilization Review Completed. 05/16/2012

## 2012-05-16 NOTE — ED Notes (Signed)
Admitting MD at bedside.

## 2012-05-17 DIAGNOSIS — E230 Hypopituitarism: Secondary | ICD-10-CM

## 2012-05-17 DIAGNOSIS — J209 Acute bronchitis, unspecified: Secondary | ICD-10-CM

## 2012-05-17 DIAGNOSIS — I10 Essential (primary) hypertension: Secondary | ICD-10-CM | POA: Diagnosis present

## 2012-05-17 LAB — CBC
HCT: 43.5 % (ref 39.0–52.0)
MCH: 26.3 pg (ref 26.0–34.0)
MCV: 79.4 fL (ref 78.0–100.0)
RBC: 5.48 MIL/uL (ref 4.22–5.81)
WBC: 12.5 10*3/uL — ABNORMAL HIGH (ref 4.0–10.5)

## 2012-05-17 LAB — BASIC METABOLIC PANEL
CO2: 28 mEq/L (ref 19–32)
Chloride: 98 mEq/L (ref 96–112)
Creatinine, Ser: 1.23 mg/dL (ref 0.50–1.35)
Glucose, Bld: 141 mg/dL — ABNORMAL HIGH (ref 70–99)
Sodium: 136 mEq/L (ref 135–145)

## 2012-05-17 MED ORDER — IPRATROPIUM BROMIDE 0.02 % IN SOLN
0.5000 mg | Freq: Four times a day (QID) | RESPIRATORY_TRACT | Status: DC | PRN
  Administered 2012-05-17: 0.5 mg via RESPIRATORY_TRACT
  Filled 2012-05-17: qty 2.5

## 2012-05-17 MED ORDER — FUROSEMIDE 10 MG/ML IJ SOLN
80.0000 mg | Freq: Two times a day (BID) | INTRAMUSCULAR | Status: DC
  Administered 2012-05-17: 80 mg via INTRAVENOUS
  Filled 2012-05-17 (×2): qty 8

## 2012-05-17 MED ORDER — FUROSEMIDE 10 MG/ML IJ SOLN
40.0000 mg | INTRAMUSCULAR | Status: AC
  Administered 2012-05-17: 40 mg via INTRAVENOUS
  Filled 2012-05-17: qty 4

## 2012-05-17 MED ORDER — ALBUTEROL SULFATE (5 MG/ML) 0.5% IN NEBU
2.5000 mg | INHALATION_SOLUTION | Freq: Four times a day (QID) | RESPIRATORY_TRACT | Status: DC | PRN
  Administered 2012-05-17: 2.5 mg via RESPIRATORY_TRACT
  Filled 2012-05-17: qty 0.5

## 2012-05-17 MED ORDER — METOLAZONE 2.5 MG PO TABS
2.5000 mg | ORAL_TABLET | ORAL | Status: AC
  Administered 2012-05-17: 2.5 mg via ORAL
  Filled 2012-05-17 (×2): qty 1

## 2012-05-17 NOTE — Evaluation (Signed)
Physical Therapy Evaluation Patient Details Name: Scott Mooney MRN: [id number] DOB: [birth year] Today's Date: 05/17/2012 Time: 4098-1191 PT Time Calculation (min): 26 min  PT Assessment / Plan / Recommendation Clinical Impression  Pt is an 79 yo male with CHF. Pt demo's good safety with transfers and ambulates safely with a RW. Pt with good command follow for DME use and transfer sequencing. Pt appears to be functioning just below baseline. Pt reports limited SOB throughout session and reports SOB not worse than usual. Pt would benefit from acute PT while in the hospital to limit deconditioning. Pt requires intermittent assistance/supervision for d/c home.    PT Assessment  Patient needs continued PT services    Follow Up Recommendations  No PT follow up;Supervision - Intermittent (For ADLs)    Does the patient have the potential to tolerate intense rehabilitation      Barriers to Discharge        Equipment Recommendations  Rolling walker with 5" wheels (for energy conservation)    Recommendations for Other Services     Frequency Min 3X/week    Precautions / Restrictions Precautions Precautions: Fall   Pertinent Vitals/Pain Pt denies pain      Mobility  Bed Mobility Bed Mobility: Not assessed (Pt recieved in chair) Transfers Transfers: Sit to Stand;Stand to Sit Sit to Stand: 4: Min guard;From chair/3-in-1 (Min gaurd for eval) Stand to Sit: 4: Min guard;To chair/3-in-1 (min gaurd for eval and v/c for sequencing) Details for Transfer Assistance: Pt fairly independent with transfers. Reports no increase of SOB Ambulation/Gait Ambulation/Gait Assistance: 4: Min guard (Min gaurd for eval) Ambulation Distance (Feet): 300 Feet Assistive device: Rolling walker (Rolling walker 150 ft, Stnd walker 150 ft) Ambulation/Gait Assistance Details: Pt with good safety w/o RW but SpO2 dropped from 94% to 89% without walker. Gait Pattern: Step-through pattern;Narrow base of support Gait  velocity: slow (pt reports this is normal speed)    Exercises     PT Diagnosis: Difficulty walking  PT Problem List: Decreased activity tolerance;Decreased knowledge of use of DME;Decreased safety awareness PT Treatment Interventions: DME instruction;Gait training;Therapeutic activities;Therapeutic exercise;Balance training   PT Goals Acute Rehab PT Goals PT Goal Formulation: With patient Time For Goal Achievement: 05/24/12 Potential to Achieve Goals: Good Pt will Transfer Bed to Chair/Chair to Bed: with modified independence;with cues (comment type and amount) (v/c's for sequencing) PT Transfer Goal: Bed to Chair/Chair to Bed - Progress: Goal set today Pt will Ambulate: >150 feet;with modified independence;with least restrictive assistive device PT Goal: Ambulate - Progress: Goal set today  Visit Information  Last PT Received On: 05/17/12    Subjective Data  Subjective: Pt recieved in chair, agreeable to PT Patient Stated Goal: To go home   Prior Functioning  Home Living Lives With: Spouse Available Help at Discharge: Family;Available PRN/intermittently Type of Home: House Home Access: Level entry Home Layout: One level Bathroom Shower/Tub: Engineer, manufacturing systems: Standard Bathroom Accessibility: No Home Adaptive Equipment: Straight cane Additional Comments: Pt's wife aids in ADLs and son does yard work. Prior Function Level of Independence: Needs assistance Needs Assistance: Bathing;Dressing;Meal Prep;Light Housekeeping Bath: Supervision/set-up Dressing: Moderate Meal Prep: Maximal Light Housekeeping: Maximal Able to Take Stairs?: No Driving: No Vocation: Retired Comments: Pt's wife assists him with bathing and with dressing- shoes and socks, cooking and cleaning.  Communication Communication: No difficulties    Cognition  Cognition Arousal/Alertness: Awake/alert Behavior During Therapy: WFL for tasks assessed/performed Overall Cognitive Status:  Within Functional Limits for tasks assessed  Extremity/Trunk Assessment Right Upper Extremity Assessment RUE ROM/Strength/Tone: Within functional levels RUE Coordination: WFL - gross/fine motor Left Upper Extremity Assessment LUE ROM/Strength/Tone: Within functional levels LUE Coordination: WFL - gross/fine motor Right Lower Extremity Assessment RLE ROM/Strength/Tone: Within functional levels RLE Coordination: WFL - gross/fine motor Left Lower Extremity Assessment LLE ROM/Strength/Tone: Within functional levels LLE Coordination: WFL - gross/fine motor   Balance    End of Session PT - End of Session Equipment Utilized During Treatment: Gait belt Activity Tolerance: Patient tolerated treatment well Patient left: in chair;with call bell/phone within reach  GP     05/17/2012, 4:03 PM Marvis Moeller, Student Physical Therapist  Office #: [phone]

## 2012-05-17 NOTE — Evaluation (Signed)
Physical Therapy Evaluation Patient Details Name: Scott Mooney MRN: [id number] DOB: 11-10-1929 Today's Date: 05/17/2012 Time: 4098-1191 PT Time Calculation (min): 26 min  PT Assessment / Plan / Recommendation Clinical Impression  Pt is an 79 yo male with CHF. Pt demo's good safety with transfers and ambulates safely with a RW. Pt with good command follow for DME use and transfer sequencing. Pt appears to be functioning just below baseline. Pt reports limited SOB throughout session and reports SOB not worse than usual. Pt would benefit from acute PT while in the hospital to limit deconditioning. Pt requires intermittent assistance/supervision for d/c home.    PT Assessment  Patient needs continued PT services    Follow Up Recommendations  No PT follow up;Supervision - Intermittent (For ADLs)    Does the patient have the potential to tolerate intense rehabilitation      Barriers to Discharge        Equipment Recommendations  Rolling walker with 5" wheels (for energy conservation)    Recommendations for Other Services     Frequency Min 3X/week    Precautions / Restrictions Precautions Precautions: Fall   Pertinent Vitals/Pain Pt denies pain      Mobility  Bed Mobility Bed Mobility: Not assessed (Pt recieved in chair) Transfers Transfers: Sit to Stand;Stand to Sit Sit to Stand: 4: Min guard;From chair/3-in-1 (Min gaurd for eval) Stand to Sit: 4: Min guard;To chair/3-in-1 (min gaurd for eval and v/c for sequencing) Details for Transfer Assistance: required v/c's for safe hand placement Reports no increase of SOB Ambulation/Gait Ambulation/Gait Assistance: 4: Min guard (Min gaurd for eval) Ambulation Distance (Feet): 300 Feet Assistive device: Rolling walker (Rolling walker 150 ft, no RW 150 ft) Ambulation/Gait Assistance Details: Pt with good safety w/o RW but SpO2 dropped from 94% to 89% without walker., v/c's for safe walker management during turning Gait Pattern:  Step-through pattern;Narrow base of support Gait velocity: slow (pt reports this is normal speed)    Exercises     PT Diagnosis: Difficulty walking  PT Problem List: Decreased activity tolerance;Decreased knowledge of use of DME;Decreased safety awareness PT Treatment Interventions: DME instruction;Gait training;Therapeutic activities;Therapeutic exercise;Balance training   PT Goals Acute Rehab PT Goals PT Goal Formulation: With patient Time For Goal Achievement: 05/24/12 Potential to Achieve Goals: Good Pt will Transfer Bed to Chair/Chair to Bed: with modified independence;with cues (comment type and amount) (v/c's for sequencing) PT Transfer Goal: Bed to Chair/Chair to Bed - Progress: Goal set today Pt will Ambulate: >150 feet;with modified independence;with least restrictive assistive device PT Goal: Ambulate - Progress: Goal set today  Visit Information  Last PT Received On: 05/17/12    Subjective Data  Subjective: Pt recieved in chair, agreeable to PT Patient Stated Goal: To go home   Prior Functioning  Home Living Lives With: Spouse Available Help at Discharge: Family;Available PRN/intermittently Type of Home: House Home Access: Level entry Home Layout: One level Bathroom Shower/Tub: Engineer, manufacturing systems: Standard Bathroom Accessibility: No Home Adaptive Equipment: Straight cane Additional Comments: Pt's wife aids in ADLs and son does yard work. Prior Function Level of Independence: Needs assistance Needs Assistance: Bathing;Dressing;Meal Prep;Light Housekeeping Bath: Supervision/set-up Dressing: Moderate Meal Prep: Maximal Light Housekeeping: Maximal Able to Take Stairs?: No Driving: No Vocation: Retired Comments: Pt's wife assists him with bathing and with dressing- shoes and socks, cooking and cleaning.  Communication Communication: No difficulties    Cognition  Cognition Arousal/Alertness: Awake/alert Behavior During Therapy: WFL for tasks  assessed/performed Overall Cognitive Status: Within  Functional Limits for tasks assessed    Extremity/Trunk Assessment Right Upper Extremity Assessment RUE ROM/Strength/Tone: Within functional levels RUE Coordination: WFL - gross/fine motor Left Upper Extremity Assessment LUE ROM/Strength/Tone: Within functional levels LUE Coordination: WFL - gross/fine motor Right Lower Extremity Assessment RLE ROM/Strength/Tone: Within functional levels RLE Coordination: WFL - gross/fine motor Left Lower Extremity Assessment LLE ROM/Strength/Tone: Within functional levels LLE Coordination: WFL - gross/fine motor   Balance    End of Session PT - End of Session Equipment Utilized During Treatment: Gait belt Activity Tolerance: Patient tolerated treatment well Patient left: in chair;with call bell/phone within reach  GP     05/17/2012, 4:03 PM Marvis Moeller, Student Physical Therapist  Office #: [phone]   Agree with above assessment.  Lewis Shock, PT, DPT Pager #: [phone] Office #: [phone]

## 2012-05-17 NOTE — Progress Notes (Signed)
Sats >92% on R.A. OOB in chair throughout shift. Walked with nurse X 3 times. No c/o chest pain , Nitro d/c.

## 2012-05-17 NOTE — Progress Notes (Addendum)
Advanced Heart Failure Rounding Note   Subjective:    79 yo with history of obesity, diastolic CHF, and panhypopituitarism presented to the [facility] ED 5/5  with shortness of breath. Patient has had recent significant weight gain. He does not follow a particularly good diet in terms of sodium restriction. Last echo in 2/14 showed preserved EF with evidence for diastolic dysfunction.   Pertinent admit labs:  pro bnp 163 CEs negative WBC 11.4 Potassium 3.9  He has been on 40 mg IV lasix every 8 hours and NTG drip for HTN. Day  # 2 of Levofloxacin for increased WBC noted on admit. Weight unchanged   Remains on IV NTG. BPs better. Breathing much better. I/Os negative 1.1L but weight unchanged. Still wheezing slightly. Has not ambulated.   Objective:   Weight Range:  Vital Signs:   Temp:  [98 F (36.7 C)-98.7 F (37.1 C)] 98 F (36.7 C) (05/07 0435) Pulse Rate:  [63-91] 67 (05/07 0614) Resp:  [19-28] 19 (05/07 0435) BP: (106-142)/(47-63) 124/63 mmHg (05/07 0614) SpO2:  [91 %-96 %] 94 % (05/07 0435) Weight:  [244 lb 0.8 oz (110.7 kg)] 244 lb 0.8 oz (110.7 kg) (05/07 0204)    Weight change: Filed Weights   05/16/12 0200 05/17/12 0204  Weight: 244 lb 0.8 oz (110.7 kg) 244 lb 0.8 oz (110.7 kg)    Intake/Output:   Intake/Output Summary (Last 24 hours) at 05/17/12 0726 Last data filed at 05/17/12 0700  Gross per 24 hour  Intake  916.8 ml  Output   2051 ml  Net -1134.2 ml     Physical Exam: General:  elderly Well appearing. No resp difficulty HEENT: normal Neck: supple. JVP hard to see but appears around 9 . Carotids 2+ bilat; no bruits. No lymphadenopathy or thryomegaly appreciated. Cor:  Regular rate & rhythm. No rubs, gallops or murmurs. Lungs: mild end-expiratory wheezing Abdomen: soft, nontender, nondistended. No hepatosplenomegaly. No bruits or masses. Good bowel sounds. Extremities: no cyanosis, clubbing, rash, 1+ edema Neuro: alert & orientedx3, cranial nerves grossly  intact. moves all 4 extremities w/o difficulty. Affect pleasant  Telemetry:   Labs: Basic Metabolic Panel:  Recent Labs Lab 05/15/12 2146 05/16/12 0500 05/17/12 0500  NA 140 139 136  K 4.0 3.9 4.0  CL 104 100 98  CO2  --  27 28  GLUCOSE 151* 141* 141*  BUN 9 10 17   CREATININE 0.90 0.95 1.23  CALCIUM  --  8.7 8.5    Liver Function Tests: No results found for this basename: AST, ALT, ALKPHOS, BILITOT, PROT, ALBUMIN,  in the last 168 hours No results found for this basename: LIPASE, AMYLASE,  in the last 168 hours No results found for this basename: AMMONIA,  in the last 168 hours  CBC:  Recent Labs Lab 05/15/12 2116 05/15/12 2146 05/16/12 0500 05/17/12 0500  WBC 11.4*  --  12.6* 12.5*  NEUTROABS 9.5*  --   --   --   HGB 16.0 17.3* 15.3 14.4  HCT 47.0 51.0 46.4 43.5  MCV 79.1  --  79.3 79.4  PLT 132*  --  141* 144*    Cardiac Enzymes:  Recent Labs Lab 05/16/12 0120 05/16/12 0740 05/16/12 1327  TROPONINI <0.30 <0.30 <0.30    BNP: BNP (last 3 results)  Recent Labs  05/15/12 2229  PROBNP 163.6     Other results: :   Imaging: Dg Chest 2 View  05/15/2012  *RADIOLOGY REPORT*  Clinical Data: Sore throat and cough  CHEST -  2 VIEW  Comparison: Chest radiograph 07/04/2010, CT 07/04/2010  Findings: Stable enlarged cardiac silhouette.  There is interstitial edema pattern which is similar to comparison exam.  A small amount fluid along the horizontal fissures.  Low lung volumes.  There is a chronic compression fracture of the lower thoracic spine again demonstrated.  IMPRESSION: Interstitial edema and cardiomegaly suggest congestive heart failure   Original Report Authenticated By: Genevive Bi, M.D.       Medications:     Scheduled Medications: . albuterol  2.5 mg Nebulization Q6H  . aspirin EC  81 mg Oral Daily  . carvedilol  6.25 mg Oral BID WC  . furosemide  40 mg Intravenous Q8H  . heparin  5,000 Units Subcutaneous Q8H  . hydrocortisone  10 mg  Oral BID  . ipratropium  0.5 mg Nebulization Q6H  . isosorbide mononitrate  60 mg Oral Daily  . levofloxacin  500 mg Oral Daily  . levothyroxine  50 mcg Oral QAC breakfast  . sodium chloride  3 mL Intravenous Q12H     Infusions: . nitroGLYCERIN 25 mcg/min (05/17/12 0000)     PRN Medications:  sodium chloride, acetaminophen, nitroGLYCERIN, ondansetron (ZOFRAN) IV, sodium chloride   Assessment:   1. A/C Diastolic Failure 01/6107 EF 65-70% Grade II diastolic dysfunction PA systolic pressure  36 mmHg 2. Acute bronchitis 3. Panhypopituitarism 4. Obesity 5. H/O GERD/hiatal hernia. History of Barretts esophagus and esophageal stricture 6. HTN    Plan/Discussion:    See below   Length of Stay: 2   CLEGG,AMY 05/17/2012, 7:26 AM  Advanced Heart Failure Team Pager [phone] (M-F; 7a - 4p)  Please contact [facility] for night-coverage after hours (4p -7a ) and weekends on [email or web address]  Patient seen and examined with Tonye Becket, NP. We discussed all aspects of the encounter. I agree with the assessment as stated above. My thoughts below.  He has both HF and bronchitis. Weight unchanged from January.  Volume status improving slowly. Will change to lasix to 80 IV bid and give one dose metolazone. Wean NTG gtt. Continue abx/nebs and hydrocortisone. No need for stress dose. OK to continue carvedilol for now but if wheezing persists may switch for amlodipine. Can go to tele when NTG off. PT to see to ambulate.   Donn Zanetti,MD 7:47 AM

## 2012-05-18 ENCOUNTER — Encounter (HOSPITAL_COMMUNITY): Payer: Self-pay | Admitting: Physician Assistant

## 2012-05-18 ENCOUNTER — Inpatient Hospital Stay (HOSPITAL_COMMUNITY): Payer: Medicare Other

## 2012-05-18 DIAGNOSIS — D696 Thrombocytopenia, unspecified: Secondary | ICD-10-CM | POA: Diagnosis present

## 2012-05-18 DIAGNOSIS — E876 Hypokalemia: Secondary | ICD-10-CM | POA: Diagnosis not present

## 2012-05-18 DIAGNOSIS — R7301 Impaired fasting glucose: Secondary | ICD-10-CM | POA: Diagnosis present

## 2012-05-18 DIAGNOSIS — I5033 Acute on chronic diastolic (congestive) heart failure: Secondary | ICD-10-CM | POA: Diagnosis present

## 2012-05-18 LAB — BASIC METABOLIC PANEL
BUN: 26 mg/dL — ABNORMAL HIGH (ref 6–23)
CO2: 27 mEq/L (ref 19–32)
Calcium: 9.3 mg/dL (ref 8.4–10.5)
Chloride: 91 mEq/L — ABNORMAL LOW (ref 96–112)
Creatinine, Ser: 1.24 mg/dL (ref 0.50–1.35)

## 2012-05-18 MED ORDER — POTASSIUM CHLORIDE CRYS ER 20 MEQ PO TBCR
40.0000 meq | EXTENDED_RELEASE_TABLET | Freq: Once | ORAL | Status: AC
  Administered 2012-05-18: 40 meq via ORAL
  Filled 2012-05-18: qty 2

## 2012-05-18 MED ORDER — FUROSEMIDE 40 MG PO TABS: 40.0000 mg | ORAL_TABLET | Freq: Two times a day (BID) | ORAL | Status: DC

## 2012-05-18 MED ORDER — AMLODIPINE BESYLATE 5 MG PO TABS
5.0000 mg | ORAL_TABLET | Freq: Every day | ORAL | Status: DC
  Filled 2012-05-18: qty 1

## 2012-05-18 MED ORDER — LEVOFLOXACIN 500 MG PO TABS: 500.0000 mg | ORAL_TABLET | Freq: Every day | ORAL | Status: DC

## 2012-05-18 MED ORDER — CARVEDILOL 6.25 MG PO TABS: 6.2500 mg | ORAL_TABLET | Freq: Two times a day (BID) | ORAL | Status: DC

## 2012-05-18 MED ORDER — POTASSIUM CHLORIDE CRYS ER 20 MEQ PO TBCR: 20.0000 meq | EXTENDED_RELEASE_TABLET | Freq: Every day | ORAL | Status: DC

## 2012-05-18 MED ORDER — AMLODIPINE BESYLATE 5 MG PO TABS: 5.0000 mg | ORAL_TABLET | Freq: Every day | ORAL | Status: DC

## 2012-05-18 MED ORDER — FUROSEMIDE 40 MG PO TABS
40.0000 mg | ORAL_TABLET | Freq: Two times a day (BID) | ORAL | Status: DC
  Administered 2012-05-18: 40 mg via ORAL
  Filled 2012-05-18 (×3): qty 1

## 2012-05-18 NOTE — Progress Notes (Signed)
Pt found wandering into another patients room stating, "I'm going home". Upon escorting patient back to his own room, it was discovered that the patient had removed the cardiac monitor and IV saline lock. Pt blatantly confused and stating that he is outdoors on a cot in the woods. Pt refused to have IV restarted but allowed staff to replace cardiac monitor.  Pt continued to try to walk out of the room stating that he is not at [facility].  Attempt made to contact patient's wife to encourage the patient to allow staff to provide care to the patient. Wife became very anxious on the phone and increased the patient's agitation. Pt's son called to encourage pt to allow staff to provide care to the patient. Pt's son became irate and suggested staff call security or someone who could handle the patient.  Security was called to patient's room and was unable to persuade pt to allow staff to care for him.  MD notified of pt confusion and refusal to allow staff to provide care or restart IV. Pt allowed to rest and re-evaluate in AM for orientation.  Will continue to monitor patient.

## 2012-05-18 NOTE — Care Management Note (Signed)
    Page 1 of 1   05/18/2012     2:45:37 PM   CARE MANAGEMENT NOTE 05/18/2012  Patient:  Scott Mooney, Scott Mooney   Account Number:  [id number]  Date Initiated:  05/18/2012  Documentation initiated by:  Serra Younan  Subjective/Objective Assessment:   79 yr-old male adm with dx of diastolic failure; lives with spouse, independent PTA     DC Planning Services  CM consult      [facility] Choice  HOME HEALTH   Choice offered to / List presented to:  C-3 Spouse       HH arranged  HH-1 RN  HH-10 DISEASE MANAGEMENT  HH-4 NURSE'S AIDE      HH agency  [facility]   Status of service:  Completed, signed off  Discharge Disposition:  HOME W HOME HEALTH SERVICES  Per UR Regulation:  Reviewed for med. necessity/level of care/duration of stay  Comments:  PCP: Dr Jarome Matin  05/18/12 1213 Deniece Rankin RN MSN BSN CCM Discussed home health services with pt and spouse, provided list of [facility] home health agencies.  Referral made per choice.

## 2012-05-18 NOTE — Progress Notes (Signed)
[facility] has arrived, Belongings at bedside. VS stable at time of transfer. Capped central line. Scott Mooney L

## 2012-05-18 NOTE — Progress Notes (Signed)
Pt discharged to home. Family and belongings at bedside. VS stable at time of transfer. Pt refused PO Norvasc and subq heparin. Discharge, medication, when to call MD/ 911 instructions given to wife. No questions or concerns at this time.  Scott Mooney L

## 2012-05-18 NOTE — Discharge Summary (Signed)
CARDIOLOGY DISCHARGE SUMMARY   Patient ID: Scott Mooney MRN: [id number] DOB/AGE: [id number] y.o.  Admit date: 05/15/2012 Discharge date: 05/18/2012  Primary Discharge Diagnosis:     Acute on chronic diastolic CHF (congestive heart failure), NYHA class 4  Secondary Discharge Diagnosis:    Acute bronchitis   Essential hypertension   Thrombocytopenia   Hypokalemia   Possible dementia   Panhypopituitarism   Hyperglycemia  Hospital Course: Scott Mooney is a 79 y.o. male with a history of CHF. He had increasing shortness of breath with wheezing and came to the hospital where he was admitted for further evaluation and treatment.  His intake and output was negative for 3-1/2 L during his hospital stay. With diuresis, his shortness of breath significantly improved and by discharge his oxygen saturation was 96% on room air. His white cell count on admission was elevated and since he was coughing and wheezing more than usual, 7 days of levofloxacin were recommended with nebulizers when necessary. By discharge, the wheezing had resolved and home nebulizers were not ordered.   Cardiac enzymes were cycled and were negative. A TSH was checked and his low. He is to continue his current Synthroid dose and follow up with primary care. His other labs were reviewed and showed thrombocytopenia which which is not new and is stable. He had no bleeding issues. He developed hypokalemia with diuresis and was supplemented. His BUN/creatinine increased some with diuresis and by discharge his BUN was 26 with a creatinine of 1.24. This will be followed closely as an outpatient.  He also had hyperglycemia with CBGs > 140. They have been > 100 in the past but are higher now. He is to follow up with his primary MD for this.   He had been started on a nitroglycerin drip for afterload reduction and blood pressure control. Once he improved, this was discontinued. He is on oral nitrates.   He had problems with  possible delirium/sundowning. He pulled out his IVs and kept trying to leave his room. He was reoriented multiple times by the staff and one night was refusing to allow staff to care for the him at all. Family was contacted to help with the situation but it did not improve. Security came to the room and he eventually agreed to get back to bed. A head CT was performed to rule out an acute process but did not show any acute abnormality. He is generally felt to have chronic issues with acute worsening due to an unfamiliar environment.   By 05/18/2012, he was clinically improved and it was felt he would continue to improve more quickly if he were at home. A home-health RN and aide were ordered for assistance. The patient and his wife are aware of the need for 24 hour supervision. He was evaluated by Dr. Shirlee Latch and considered stable for discharge, to continue an increased Lasix dose temporarily and have an early followup appointment.  Labs:   Lab Results  Component Value Date   WBC 12.5* 05/17/2012   HGB 14.4 05/17/2012   HCT 43.5 05/17/2012   MCV 79.4 05/17/2012   PLT 144* 05/17/2012     Recent Labs Lab 05/18/12 0345  NA 136  K 3.4*  CL 91*  CO2 27  BUN 26*  CREATININE 1.24  CALCIUM 9.3  GLUCOSE 145*    Recent Labs  05/16/12 0120 05/16/12 0740 05/16/12 1327  TROPONINI <0.30 <0.30 <0.30   Lab Results  Component Value Date   TSH  0.082* 05/16/2012   FREE T3 2.0*    FREE T4 0.80*    Pro B Natriuretic peptide (BNP)  Date/Time Value Range Status  05/15/2012 10:29 PM 163.6  0 - 450 pg/mL Final  09/13/2010  1:57 PM 123.5  0 - 450 pg/mL Final     Radiology: Ct Head Wo Contrast 05/18/2012  *RADIOLOGY REPORT*  Clinical Data: Acute mental status changes with confusion and agitation yesterday, currently back to baseline mental status. Prior history of transsphenoidal resection of a pituitary tumor.  CT HEAD WITHOUT CONTRAST  Technique:  Contiguous axial images were obtained from the base of the skull  through the vertex without contrast.  Comparison: MRI brain 01/05/2009, 04/23/2003, 01/27/2001.  Findings: Mild to moderate cortical atrophy and mild deep atrophy, unchanged.  No significant white matter disease.  No mass lesion. No midline shift.  No acute hemorrhage or hematoma.  No extra-axial fluid collections.  No evidence of acute infarction.  Post-surgical changes in the sella were better imaged on the prior MR examinations.  No skull fracture or other focal osseous abnormality involving the skull.  Visualized paranasal sinuses, bilateral mastoid air cells, and bilateral middle ear cavities well-aerated.  Extensive bilateral carotid siphon and left vertebral artery atherosclerosis.  IMPRESSION:  1.  No acute intracranial abnormality. 2.  Stable mild to moderate generalized atrophy.   Original Report Authenticated By: Hulan Saas, M.D.     Dg Chest 2 View 05/15/2012  *RADIOLOGY REPORT*  Clinical Data: Sore throat and cough  CHEST - 2 VIEW  Comparison: Chest radiograph 07/04/2010, CT 07/04/2010  Findings: Stable enlarged cardiac silhouette.  There is interstitial edema pattern which is similar to comparison exam.  A small amount fluid along the horizontal fissures.  Low lung volumes.  There is a chronic compression fracture of the lower thoracic spine again demonstrated.  IMPRESSION: Interstitial edema and cardiomegaly suggest congestive heart failure   Original Report Authenticated By: Genevive Bi, M.D.    EKG: 16-May-2012 06:32:52 [facility]26 ROUTINE RECORD Normal sinus rhythm Left axis deviation Right bundle branch block Inferior infarct , age undetermined Abnormal ECG 63mm/s 15mm/mV 100Hz  8.0.1 12SL 239 CID: 1 Referred by: Unconfirmed Vent. rate 89 BPM PR interval 190 ms QRS duration 152 ms QT/QTc 424/515 ms P-R-T axes 51 -69 41  FOLLOW UP PLANS AND APPOINTMENTS Allergies  Allergen Reactions  . Penicillins Swelling and Other (See Comments)    Head and feet  swelling, spent 5 days in hospital as a result of taking med     Medication List    TAKE these medications       amLODipine 5 MG tablet  Commonly known as:  NORVASC  Take 1 tablet (5 mg total) by mouth daily.     aspirin 81 MG tablet  Take 81 mg by mouth daily.     carvedilol 6.25 MG tablet  Commonly known as:  COREG  Take 1 tablet (6.25 mg total) by mouth 2 (two) times daily with a meal.     furosemide 40 MG tablet  Commonly known as:  LASIX  Take 1 tablet (40 mg total) by mouth 2 (two) times daily.     hydrocortisone 20 MG tablet  Commonly known as:  CORTEF  Take 10 mg by mouth 2 (two) times daily.     isosorbide mononitrate 60 MG 24 hr tablet  Commonly known as:  IMDUR  Take 60 mg by mouth daily.     levofloxacin 500 MG tablet  Commonly known  asBarbera Setters  Take 1 tablet (500 mg total) by mouth daily.     levothyroxine 50 MCG tablet  Commonly known as:  SYNTHROID, LEVOTHROID  Take 50 mcg by mouth daily.     multivitamin per tablet  Take 1 tablet by mouth daily.     potassium chloride SA 20 MEQ tablet  Commonly known as:  K-DUR,KLOR-CON  Take 1 tablet (20 mEq total) by mouth daily.         Future Appointments Provider Department Dept Phone   05/25/2012 2:30 PM [facility] Main Office [place]) [phone]   05/25/2012 2:45 PM Cassell Clement, MD [facility] Main Office [place]) [phone]     Follow-up Information   Follow up with Cassell Clement, MD On 05/25/2012. (Lab work at 2:15 pm and MD visit at 2:30 pm)    Contact information:   [address] [place] [address] [phone]       BRING ALL MEDICATIONS WITH YOU TO FOLLOW UP APPOINTMENTS  Time spent with patient to include physician time: 48 min Signed: Theodore Demark, PA-C 05/18/2012, 12:23 PM Co-Sign MD

## 2012-05-18 NOTE — Progress Notes (Signed)
Patient ID: Scott Mooney, male   DOB: 29-Jun-1929, 79 y.o.   MRN: [id number]    Subjective:    79 yo with history of obesity, diastolic CHF, and panhypopituitarism presented to the [facility] ED 5/5  with shortness of breath. Patient has had recent significant weight gain. He does not follow a particularly good diet in terms of sodium restriction. Last echo in 2/14 showed preserved EF with evidence for diastolic dysfunction.   Pertinent admit labs:  pro bnp 163 CEs negative WBC 11.4 Potassium 3.9  He has been on IV Lasix since admission. Day  # 3 of Levofloxacin for increased WBC noted on admit. Weight unchanged   Breathing is much better.  I/Os net negative 1150 on IV Lasix but weight unchanged.  No audible wheezing, looks comfortable.  Became very confused overnight and pulled out his IV, began wandering.  Hard to get back in bed again.  This morning, he seems to be back to baseline.  He makes appropriate responses to questions.   Objective:   Weight Range:  Vital Signs:   Temp:  [97.6 F (36.4 C)-98 F (36.7 C)] 97.6 F (36.4 C) (05/08 0407) Pulse Rate:  [74-98] 76 (05/07 2000) Resp:  [15-24] 24 (05/07 2000) BP: (119-174)/(43-78) 174/78 mmHg (05/08 0407) SpO2:  [92 %-96 %] 96 % (05/08 0407) Weight:  [244 lb 0.8 oz (110.7 kg)] 244 lb 0.8 oz (110.7 kg) (05/08 0407) Last BM Date: 05/17/12  Weight change: Filed Weights   05/16/12 0200 05/17/12 0204 05/18/12 0407  Weight: 244 lb 0.8 oz (110.7 kg) 244 lb 0.8 oz (110.7 kg) 244 lb 0.8 oz (110.7 kg)    Intake/Output:   Intake/Output Summary (Last 24 hours) at 05/18/12 0736 Last data filed at 05/18/12 0316  Gross per 24 hour  Intake    700 ml  Output   1850 ml  Net  -1150 ml     Physical Exam: General:  elderly Well appearing. No resp difficulty HEENT: normal Neck: supple. JVP hard to see but does not appear elevated today. Carotids 2+ bilat; no bruits. No lymphadenopathy or thryomegaly appreciated. Cor:  Regular rate & rhythm. No  rubs, gallops or murmurs. Lungs: No wheezing. Abdomen: soft, nontender, nondistended. No hepatosplenomegaly. No bruits or masses. Good bowel sounds. Extremities: no cyanosis, clubbing, rash, 1+ edema Neuro: alert & orientedx3, cranial nerves grossly intact. moves all 4 extremities w/o difficulty. Affect pleasant  Telemetry:   Labs: Basic Metabolic Panel:  Recent Labs Lab 05/15/12 2146 05/16/12 0500 05/17/12 0500 05/18/12 0345  NA 140 139 136 136  K 4.0 3.9 4.0 3.4*  CL 104 100 98 91*  CO2  --  27 28 27   GLUCOSE 151* 141* 141* 145*  BUN 9 10 17  26*  CREATININE 0.90 0.95 1.23 1.24  CALCIUM  --  8.7 8.5 9.3    Liver Function Tests: No results found for this basename: AST, ALT, ALKPHOS, BILITOT, PROT, ALBUMIN,  in the last 168 hours No results found for this basename: LIPASE, AMYLASE,  in the last 168 hours No results found for this basename: AMMONIA,  in the last 168 hours  CBC:  Recent Labs Lab 05/15/12 2116 05/15/12 2146 05/16/12 0500 05/17/12 0500  WBC 11.4*  --  12.6* 12.5*  NEUTROABS 9.5*  --   --   --   HGB 16.0 17.3* 15.3 14.4  HCT 47.0 51.0 46.4 43.5  MCV 79.1  --  79.3 79.4  PLT 132*  --  141* 144*  Cardiac Enzymes:  Recent Labs Lab 05/16/12 0120 05/16/12 0740 05/16/12 1327  TROPONINI <0.30 <0.30 <0.30    BNP: BNP (last 3 results)  Recent Labs  05/15/12 2229  PROBNP 163.6     Other results: :   Imaging: No results found.   Medications:     Scheduled Medications: . aspirin EC  81 mg Oral Daily  . carvedilol  6.25 mg Oral BID WC  . furosemide  40 mg Oral BID  . heparin  5,000 Units Subcutaneous Q8H  . hydrocortisone  10 mg Oral BID  . isosorbide mononitrate  60 mg Oral Daily  . levofloxacin  500 mg Oral Daily  . levothyroxine  50 mcg Oral QAC breakfast  . potassium chloride  40 mEq Oral Once  . sodium chloride  3 mL Intravenous Q12H    Infusions: . nitroGLYCERIN Stopped (05/17/12 1800)    PRN Medications: sodium  chloride, acetaminophen, albuterol, ipratropium, nitroGLYCERIN, ondansetron (ZOFRAN) IV, sodium chloride   Assessment:   1. A/C Diastolic Failure 04/979 EF 65-70% Grade II diastolic dysfunction PA systolic pressure 36 mmHg 2. Acute bronchitis with wheezing.  3. Panhypopituitarism 4. Obesity 5. H/O GERD/hiatal hernia. History of Barretts esophagus and esophageal stricture 6. HTN 7. Suspect dementia, superimposed delirium.     Plan/Discussion:    Patient has both acute/chronic diastolic CHF and acute bronchitis.  He feels better today and is off oxygen.  No wheezing.  Volume appears improved with some diuresis.  He was confused and agitated overnight, now better.  I think that there is some underlying dementia with superimposed delirium last night.    As he is clinically improved, I am going to let him go home today.  I think that he will do better in his home environment in terms of less propensity to confusion/delirium.   He will need to continue course of levofloxacin.  I am going to increase his home Lasix dose until he sees Dr. Patty Sermons in followup.    Followup: Dr. Patty Sermons or PA in 5 days with BMET.  Meds: Lasix 40 mg po bid, KCl 20 mEq daily, levofloxacin 500 mg daily to complete 1 week, amlodipine 5 mg daily, Coreg 6.25 mg bid, ASA 81 daily, Imdur 60 daily, noncardiac meds as prior to admission.   Connery Shiffler,MD 7:36 AM

## 2012-05-18 NOTE — Progress Notes (Signed)
Pt refusing reinsertion of PIV, MD Mclean in room, plan to d/c to home today. Changed IV meds to PO. Will cont to monitor pt. 1020-Rhonda, PA in room, d/c to home today. Family at bedside.  Byrd Rushlow L

## 2012-05-25 ENCOUNTER — Ambulatory Visit (INDEPENDENT_AMBULATORY_CARE_PROVIDER_SITE_OTHER): Payer: Medicare Other | Admitting: Cardiology

## 2012-05-25 ENCOUNTER — Encounter: Payer: Self-pay | Admitting: Cardiology

## 2012-05-25 ENCOUNTER — Other Ambulatory Visit: Payer: Medicare Other

## 2012-05-25 VITALS — BP 125/75 | HR 64 | Ht 64.0 in | Wt 240.1 lb

## 2012-05-25 DIAGNOSIS — I509 Heart failure, unspecified: Secondary | ICD-10-CM

## 2012-05-25 DIAGNOSIS — I5032 Chronic diastolic (congestive) heart failure: Secondary | ICD-10-CM

## 2012-05-25 DIAGNOSIS — I10 Essential (primary) hypertension: Secondary | ICD-10-CM

## 2012-05-25 LAB — BASIC METABOLIC PANEL
CO2: 28 mEq/L (ref 19–32)
Calcium: 8.3 mg/dL — ABNORMAL LOW (ref 8.4–10.5)
Creatinine, Ser: 1.1 mg/dL (ref 0.4–1.5)
GFR: 67.53 mL/min (ref >60.00)
Glucose, Bld: 148 mg/dL — ABNORMAL HIGH (ref 70–99)

## 2012-05-25 NOTE — Progress Notes (Signed)
Quick Note:  Please report to patient. The recent labs are stable. Continue same medication and careful diet. Kidney function good on present meds. ______

## 2012-05-25 NOTE — Patient Instructions (Addendum)
Will obtain labs today and call you with the results (bmet)  Your physician recommends that you continue on your current medications as directed. Please refer to the Current Medication list given to you today.  Your physician recommends that you schedule a follow-up appointment in: July

## 2012-05-25 NOTE — Progress Notes (Signed)
Scott Mooney Date of Birth:  1929-07-18 Scott Mooney [address] [place], [place]  [address] [phone]         Fax   [phone]  History of Present Illness: This pleasant 79 year old gentleman is seen for a post [facility] office visit.  He was hospitalized from 05/15/2012 until 05/18/2012 with acute on chronic diastolic congestive heart failure. He has a history of compensated diastolic congestive heart failure. His last previous echocardiogram on 05/07/09 showed an ejection fraction of 55-60% with normal systolic function and moderate LVH and with grade 2 diastolic dysfunction. The patient does not have any history of ischemic heart disease. He has not had cardiac catheterization. He did have a normal nuclear stress test at [facility] in 2000.  The patient had a repeat echocardiogram in February 2014 which showed the following: Study Conclusions  - Left ventricle: The cavity size was normal. Wall thickness was increased in a pattern of moderate LVH. Systolic function was vigorous. The estimated ejection fraction was in the range of 65% to 70%. Wall motion was normal; there were no regional wall motion abnormalities. Features are consistent with a pseudonormal left ventricular filling pattern, with concomitant abnormal relaxation and increased filling pressure (grade 2 diastolic dysfunction). - Aortic valve: Trivial regurgitation. - Mitral valve: There was systolic anterior motion. Mild to moderate regurgitation. - Atrial septum: No defect or patent foramen ovale was identified. - Pulmonary arteries: PA peak pressure: 36mm Hg (S).   Current Outpatient Prescriptions  Medication Sig Dispense Refill  . amLODipine (NORVASC) 5 MG tablet Take 1 tablet (5 mg total) by mouth daily.  30 tablet  11  . aspirin 81 MG tablet Take 81 mg by mouth daily.        . carvedilol (COREG) 6.25 MG tablet Take 1 tablet (6.25 mg total) by mouth 2 (two) times daily with a  meal.  60 tablet  11  . furosemide (LASIX) 40 MG tablet Take 40 mg by mouth 2 (two) times daily. Take 2 tab qam and after church on sundays.      . hydrocortisone (CORTEF) 20 MG tablet Take 10 mg by mouth 2 (two) times daily.       Marland Kitchen levofloxacin (LEVAQUIN) 500 MG tablet Take 1 tablet (500 mg total) by mouth daily.  5 tablet  0  . levothyroxine (SYNTHROID, LEVOTHROID) 50 MCG tablet Take 50 mcg by mouth daily.        . multivitamin (THERAGRAN) per tablet Take 1 tablet by mouth daily.        . potassium chloride SA (K-DUR,KLOR-CON) 20 MEQ tablet Take 1 tablet (20 mEq total) by mouth daily.  30 tablet  11  . predniSONE (DELTASONE) 20 MG tablet Take 20 mg by mouth daily. Take 2 tablets by mouth for 2 days and then 1 tablet daily for 6 days.      . isosorbide mononitrate (IMDUR) 60 MG 24 hr tablet Take 60 mg by mouth daily.         No current facility-administered medications for this visit.    Allergies  Allergen Reactions  . Penicillins Swelling and Other (See Comments)    Head and feet swelling, spent 5 days in [facility] as a result of taking med    Patient Active Problem List   Diagnosis Date Noted  . Acute on chronic diastolic CHF (congestive heart failure), NYHA class 4 05/18/2012  . Hypokalemia 05/18/2012  . Fasting hyperglycemia 05/18/2012  . Thrombocytopenia   . Acute  bronchitis 05/17/2012  . Essential hypertension 05/17/2012  . Bifascicular block 02/10/2011  . Chronic diastolic congestive heart failure 02/10/2011  . Nausea alone 10/05/2010  . Esophageal reflux 10/05/2010  . Gastritis 10/05/2010  . Stricture of esophagus 10/05/2010  . Barrett esophagus 10/05/2010  . Exogenous obesity 08/14/2010  . Hiatal hernia   . Hypothyroidism   . GERD (gastroesophageal reflux disease)   . Splenic flexure syndrome   . ABDOMINAL PAIN, UNSPECIFIED SITE 02/03/2009  . WEIGHT LOSS-ABNORMAL 01/28/2009  . HEADACHE 01/28/2009  . NAUSEA AND VOMITING 01/28/2009  . DIARRHEA, ACUTE 07/02/2008  .  FECAL OCCULT BLOOD 07/02/2008  . 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 DIABETES INSIPIDUS 01/09/2007  . CARDIOMYOPATHY 01/09/2007  . CHF, MILD 01/09/2007  . ARTHRITIS 01/09/2007  . ADENOMATOUS COLONIC POLYP 06/22/2005  . HEMORRHOIDS, INTERNAL 06/22/2005  . DIVERTICULOSIS, COLON 06/22/2005    History  Smoking status  . Former Smoker  . Quit date: 01/11/1962  Smokeless tobacco  . Never Used    History  Alcohol Use No    Family History  Problem Relation Age of Onset  . Colon cancer Brother   . Cancer Sister     ?  . Stroke Father   . Skin cancer Mother     Review of Systems: Constitutional: no fever chills diaphoresis or fatigue or change in weight.  Head and neck: no hearing loss, no epistaxis, no photophobia or visual disturbance. Respiratory: No cough, shortness of breath or wheezing. Cardiovascular: No chest pain peripheral edema, palpitations. Gastrointestinal: No abdominal distention, no abdominal pain, no change in bowel habits hematochezia or melena. Genitourinary: No dysuria, no frequency, no urgency, no nocturia. Musculoskeletal:No arthralgias, no back pain, no gait disturbance or myalgias. Neurological: No dizziness, no headaches, no numbness, no seizures, no syncope, no weakness, no tremors. Hematologic: No lymphadenopathy, no easy bruising. Psychiatric: No confusion, no hallucinations, no sleep disturbance.    Physical Exam: Filed Vitals:   05/25/12 1443  BP: 125/75  Pulse: 64   general appearance reveals a well-developed well-nourished somewhat obese gentleman in no acute distress.The head and neck exam reveals pupils equal and reactive.  Extraocular movements are full.  There is no scleral icterus.  The mouth and pharynx are normal.  The neck is supple.  The carotids reveal no bruits.  The jugular venous pressure is normal.  The  thyroid is not enlarged.  There is no lymphadenopathy.  The chest is clear to percussion and auscultation.  There are no rales or  rhonchi.  Expansion of the chest is symmetrical.  The precordium is quiet.  The first heart sound is normal.  The second heart sound is physiologically split.  There is no murmur gallop rub or click.  There is no abnormal lift or heave.  The abdomen is soft and nontender.  The bowel sounds are normal.  The liver and spleen are not enlarged.  There are no abdominal masses.  There are no abdominal bruits.  Extremities reveal good pedal pulses.  There is no phlebitis or edema.  There is no cyanosis or clubbing.  Strength is normal and symmetrical in all extremities.  There is no lateralizing weakness.  There are no sensory deficits.  The skin is warm and dry.  There is no rash.  EKG from 05/15/12 was reviewed and shows normal sinus rhythm with bifascicular block, left anterior block and right bundle branch block.   Assessment / Plan:  The patient is to continue on same medication.   we talked about the importance  of cutting back on food consumption lose more weight.  He will keep his previous scheduled appointment for the end of July.

## 2012-05-25 NOTE — Assessment & Plan Note (Signed)
Blood pressure was remaining stable on current therapy 

## 2012-05-25 NOTE — Assessment & Plan Note (Signed)
Prior to his recent hospitalization the patient had been eating more salt.  He is now on a more salt restricted diet and has been on furosemide.  His weight is down 4 pounds since last visit.  His breathing is less labored.

## 2012-06-07 ENCOUNTER — Telehealth: Payer: Self-pay | Admitting: Cardiology

## 2012-06-07 NOTE — Telephone Encounter (Signed)
New problem   Scott Mooney stated this is the day of discharge. Want to know if this is ok or does she need additional visits. Stated it is doc decision for discharge today. Please call Kim.

## 2012-06-07 NOTE — Telephone Encounter (Signed)
[facility] called because today is pt's D/C day. Kim state pt still has  LE swelling, lungs clear no SOB. Pt missed the last appointment with Dr. Patty Sermons so, appointment was  resceduled  For July 2014. Kim RN would like for Dr. Patty Sermons to call her today,  if pt needs additional visits at [phone].

## 2012-06-08 NOTE — Telephone Encounter (Signed)
Left message to call back  

## 2012-06-08 NOTE — Telephone Encounter (Signed)
Agree with plan 

## 2012-06-08 NOTE — Telephone Encounter (Signed)
Spoke with Scott Mooney and v/o given to extend home health for another month. Patient is having transportation issues and that is why last ov was cancelled. Explained to Scott Mooney we would work with him on getting him seen before July to just call, verbalized understanding.

## 2012-06-14 ENCOUNTER — Telehealth: Payer: Self-pay | Admitting: Cardiology

## 2012-06-14 NOTE — Telephone Encounter (Signed)
Pt was told to be seen in a week, but had to rs it and was moved a month later, was told by his nurse that he needs appt next week since dr Patty Sermons requested a 1 week appt pls call  [phone]

## 2012-06-14 NOTE — Telephone Encounter (Signed)
Pt called because he said [facility] nurse Selena Batten, told her that Dr. Patty Sermons wanted to see him before July 2014. Pt states that  the Appointment was  made for July 8 th 2014 instead. Pt is aware that I will send this message to Dr. Yevonne Pax nurse to see what she can do. Pt verbalized understanding.

## 2012-06-16 NOTE — Telephone Encounter (Signed)
Called patient to move up appointment to next week, no answer will try again later

## 2012-06-16 NOTE — Telephone Encounter (Signed)
No answer

## 2012-06-20 NOTE — Telephone Encounter (Signed)
Finally able to reach patient and scheduled appointment for Thursday 06/22/12

## 2012-06-22 ENCOUNTER — Ambulatory Visit (INDEPENDENT_AMBULATORY_CARE_PROVIDER_SITE_OTHER): Payer: Medicare Other | Admitting: Cardiology

## 2012-06-22 ENCOUNTER — Encounter: Payer: Self-pay | Admitting: Cardiology

## 2012-06-22 VITALS — BP 124/66 | HR 64 | Ht 64.0 in | Wt 241.8 lb

## 2012-06-22 DIAGNOSIS — I10 Essential (primary) hypertension: Secondary | ICD-10-CM

## 2012-06-22 DIAGNOSIS — I452 Bifascicular block: Secondary | ICD-10-CM

## 2012-06-22 DIAGNOSIS — I5032 Chronic diastolic (congestive) heart failure: Secondary | ICD-10-CM

## 2012-06-22 NOTE — Patient Instructions (Addendum)
Your physician recommends that you continue on your current medications as directed. Please refer to the Current Medication list given to you today.  Your physician wants you to follow-up in: 4 month ov/ekg  You will receive a reminder letter in the mail two months in advance. If you don't receive a letter, please call our office to schedule the follow-up appointment.  

## 2012-06-22 NOTE — Assessment & Plan Note (Signed)
The patient is not having any paroxysmal nocturnal dyspnea.  He is trying to watch his dietary salt but cannot do so when they eat out in restaurants.  He has had trace pretibial and ankle edema which has not been any worse. A home health nurse has been checking it at home weekly.

## 2012-06-22 NOTE — Assessment & Plan Note (Signed)
Blood pressure is stable on current medication.

## 2012-06-22 NOTE — Assessment & Plan Note (Signed)
The patient has a history of bifascicular heart block.  He has not been having any Stokes-Adams attacks or syncopal spells.

## 2012-06-22 NOTE — Progress Notes (Signed)
Scott Mooney Date of Birth:  1929-11-23 [facility] [address] [place], [place]  [address] [phone]         Fax   [phone]  History of Present Illness: This pleasant 79 year old gentleman is seen for a post hospital office visit. He was hospitalized from 05/15/2012 until 05/18/2012 with acute on chronic diastolic congestive heart failure. He has a history of compensated diastolic congestive heart failure. His last previous echocardiogram on 05/07/09 showed an ejection fraction of 55-60% with normal systolic function and moderate LVH and with grade 2 diastolic dysfunction. The patient does not have any history of ischemic heart disease. He has not had cardiac catheterization. He did have a normal nuclear stress test at [facility] in 2000. The patient had a repeat echocardiogram in February 2014 which showed the following:  Study Conclusions  - Left ventricle: The cavity size was normal. Wall thickness was increased in a pattern of moderate LVH. Systolic function was vigorous. The estimated ejection fraction was in the range of 65% to 70%. Wall motion was normal; there were no regional wall motion abnormalities. Features are consistent with a pseudonormal left ventricular filling pattern, with concomitant abnormal relaxation and increased filling pressure (grade 2 diastolic dysfunction). - Aortic valve: Trivial regurgitation. - Mitral valve: There was systolic anterior motion. Mild to moderate regurgitation. - Atrial septum: No defect or patent foramen ovale was identified. - Pulmonary arteries: PA peak pressure: 36mm Hg (S).  Since last visit the patient has not been successful in losing weight.  He is very inactive according to his wife and just sits in a chair all day.  He has not been experiencing any chest pain or angina.  Current Outpatient Prescriptions  Medication Sig Dispense Refill  . amLODipine (NORVASC) 5 MG tablet Take 1 tablet (5 mg  total) by mouth daily.  30 tablet  11  . aspirin 81 MG tablet Take 81 mg by mouth daily.        . carvedilol (COREG) 6.25 MG tablet Take 1 tablet (6.25 mg total) by mouth 2 (two) times daily with a meal.  60 tablet  11  . furosemide (LASIX) 40 MG tablet Take 40 mg by mouth 2 (two) times daily. Take 2 tab qam and after church on sundays.      . hydrocortisone (CORTEF) 20 MG tablet Take 10 mg by mouth 2 (two) times daily.       . isosorbide mononitrate (IMDUR) 60 MG 24 hr tablet Take 60 mg by mouth daily.        Marland Kitchen levothyroxine (SYNTHROID, LEVOTHROID) 50 MCG tablet Take 50 mcg by mouth daily.        . multivitamin (THERAGRAN) per tablet Take 1 tablet by mouth daily.        . potassium chloride SA (K-DUR,KLOR-CON) 20 MEQ tablet Take 1 tablet (20 mEq total) by mouth daily.  30 tablet  11  . predniSONE (DELTASONE) 20 MG tablet Take 20 mg by mouth daily. Take 2 tablets by mouth for 2 days and then 1 tablet daily for 6 days.      Marland Kitchen levofloxacin (LEVAQUIN) 500 MG tablet Take 1 tablet (500 mg total) by mouth daily.  5 tablet  0   No current facility-administered medications for this visit.    Allergies  Allergen Reactions  . Penicillins Swelling and Other (See Comments)    Head and feet swelling, spent 5 days in hospital as a result of taking med  Patient Active Problem List   Diagnosis Date Noted  . Chronic diastolic heart failure 06/22/2012  . Acute on chronic diastolic CHF (congestive heart failure), NYHA class 4 05/18/2012  . Hypokalemia 05/18/2012  . Fasting hyperglycemia 05/18/2012  . Thrombocytopenia   . Acute bronchitis 05/17/2012  . Essential hypertension 05/17/2012  . Bifascicular block 02/10/2011  . Chronic diastolic congestive heart failure 02/10/2011  . Nausea alone 10/05/2010  . Esophageal reflux 10/05/2010  . Gastritis 10/05/2010  . Stricture of esophagus 10/05/2010  . Barrett esophagus 10/05/2010  . Exogenous obesity 08/14/2010  . Hiatal hernia   . Hypothyroidism   .  GERD (gastroesophageal reflux disease)   . Splenic flexure syndrome   . ABDOMINAL PAIN, UNSPECIFIED SITE 02/03/2009  . WEIGHT LOSS-ABNORMAL 01/28/2009  . HEADACHE 01/28/2009  . NAUSEA AND VOMITING 01/28/2009  . DIARRHEA, ACUTE 07/02/2008  . FECAL OCCULT BLOOD 07/02/2008  . 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 DIABETES INSIPIDUS 01/09/2007  . CARDIOMYOPATHY 01/09/2007  . CHF, MILD 01/09/2007  . ARTHRITIS 01/09/2007  . ADENOMATOUS COLONIC POLYP 06/22/2005  . HEMORRHOIDS, INTERNAL 06/22/2005  . DIVERTICULOSIS, COLON 06/22/2005    History  Smoking status  . Former Smoker  . Quit date: 01/11/1962  Smokeless tobacco  . Never Used    History  Alcohol Use No    Family History  Problem Relation Age of Onset  . Colon cancer Brother   . Cancer Sister     ?  . Stroke Father   . Skin cancer Mother     Review of Systems: Constitutional: no fever chills diaphoresis or fatigue or change in weight.  Head and neck: no hearing loss, no epistaxis, no photophobia or visual disturbance. Respiratory: No cough, shortness of breath or wheezing. Cardiovascular: No chest pain peripheral edema, palpitations. Gastrointestinal: No abdominal distention, no abdominal pain, no change in bowel habits hematochezia or melena. Genitourinary: No dysuria, no frequency, no urgency, no nocturia. Musculoskeletal:No arthralgias, no back pain, no gait disturbance or myalgias. Neurological: No dizziness, no headaches, no numbness, no seizures, no syncope, no weakness, no tremors. Hematologic: No lymphadenopathy, no easy bruising. Psychiatric: No confusion, no hallucinations, no sleep disturbance.    Physical Exam: Filed Vitals:   06/22/12 0914  BP: 124/66  Pulse: 64   the general appearance reveals a well-developed well-nourished somewhat obese gentleman in no acute distress.  His weight is up 1 pound since last visit.The head and neck exam reveals pupils equal and reactive.  Extraocular movements are full.   There is no scleral icterus.  The mouth and pharynx are normal.  The neck is supple.  The carotids reveal no bruits.  The jugular venous pressure is normal.  The  thyroid is not enlarged.  There is no lymphadenopathy.  The chest is clear to percussion and auscultation.  There are no rales or rhonchi.  Expansion of the chest is symmetrical.  The precordium is quiet.  The first heart sound is normal.  The second heart sound is physiologically split.  There is no murmur gallop rub or click.  There is no abnormal lift or heave.  The abdomen is soft and nontender.  The bowel sounds are normal.  The liver and spleen are not enlarged.  There are no abdominal masses.  There are no abdominal bruits.  Extremities reveal good pedal pulses.  There is trace pretibial and ankle edema and he has very thick ankles.  There is no cyanosis or clubbing.  Strength is normal and symmetrical in all extremities.  There is  no lateralizing weakness.  There are no sensory deficits.  The skin is warm and dry.  There is no rash.    Assessment / Plan: I encouraged the patient to try even harder to lose weight.  Limit calories.  Try to walk more at least in the house for exercise. Recheck in 4 months for followup office visit and EKG.

## 2012-06-27 ENCOUNTER — Telehealth: Payer: Self-pay | Admitting: Cardiology

## 2012-06-27 NOTE — Telephone Encounter (Signed)
Spoke with Selena Batten (pt's home health nurse). Pt is due for last home health visit tomorrow. She would like to know if Dr. Patty Sermons would like home health to continue to see pt. If so pt will need new order and this can be called to her. Will forward to Dr. Patty Sermons for review.

## 2012-06-27 NOTE — Telephone Encounter (Signed)
Yes continue home health visits for a while longer since he is still not doing too well.

## 2012-06-27 NOTE — Telephone Encounter (Signed)
New problem    Last home visit with [facility] will be on 6/18. If services is needed, please fax over a new order or verbal order.

## 2012-06-27 NOTE — Telephone Encounter (Signed)
Spoke with Selena Batten and gave her information from Dr. Patty Sermons. She will let us know if more information needed.

## 2012-06-28 ENCOUNTER — Telehealth: Payer: Self-pay | Admitting: Cardiology

## 2012-06-28 MED ORDER — FUROSEMIDE 40 MG PO TABS: ORAL_TABLET | ORAL | Status: DC

## 2012-06-28 NOTE — Telephone Encounter (Signed)
Spoke with Scott Mooney from [facility]  According to pt's scale his weight was 229 lbs on 6/11; today his weight is 240 lbs pt has gain  11 lbs  in the last 7 days. Pt is SOB with mild activity, expiratory whizzing, BP 130/80, heart rate is 60 to 62 beats/minute , Irregular; LE swelling 1 + nonpitting edema. In the past 3 days pt has no appetite and is also weak. Dr. Lucienne Mooney recommends for pt to increased the lasix to 80 mg twice a day. Pt/wife Scott Mooney are to call the office next week to let Dr. Patty Mooney know how he is doing. Wife verbalized understanding

## 2012-06-28 NOTE — Telephone Encounter (Signed)
New problem   Kim from [facility] stated pt is having fluid overload.

## 2012-07-08 ENCOUNTER — Inpatient Hospital Stay (HOSPITAL_COMMUNITY)
Admission: EM | Admit: 2012-07-08 | Discharge: 2012-07-16 | DRG: 291 | Disposition: A | Discharge status: Home Health | Payer: Medicare Other | Attending: Internal Medicine | Admitting: Internal Medicine

## 2012-07-08 ENCOUNTER — Encounter (HOSPITAL_COMMUNITY): Payer: Self-pay

## 2012-07-08 ENCOUNTER — Emergency Department (HOSPITAL_COMMUNITY): Payer: Medicare Other

## 2012-07-08 DIAGNOSIS — I959 Hypotension, unspecified: Secondary | ICD-10-CM | POA: Diagnosis present

## 2012-07-08 DIAGNOSIS — E230 Hypopituitarism: Secondary | ICD-10-CM | POA: Diagnosis present

## 2012-07-08 DIAGNOSIS — I509 Heart failure, unspecified: Secondary | ICD-10-CM

## 2012-07-08 DIAGNOSIS — I872 Venous insufficiency (chronic) (peripheral): Secondary | ICD-10-CM | POA: Diagnosis present

## 2012-07-08 DIAGNOSIS — E871 Hypo-osmolality and hyponatremia: Secondary | ICD-10-CM | POA: Diagnosis present

## 2012-07-08 DIAGNOSIS — I472 Ventricular tachycardia, unspecified: Secondary | ICD-10-CM | POA: Diagnosis not present

## 2012-07-08 DIAGNOSIS — K7689 Other specified diseases of liver: Secondary | ICD-10-CM | POA: Diagnosis present

## 2012-07-08 DIAGNOSIS — M109 Gout, unspecified: Secondary | ICD-10-CM | POA: Diagnosis present

## 2012-07-08 DIAGNOSIS — I5032 Chronic diastolic (congestive) heart failure: Secondary | ICD-10-CM

## 2012-07-08 DIAGNOSIS — R7401 Elevation of levels of liver transaminase levels: Secondary | ICD-10-CM | POA: Diagnosis present

## 2012-07-08 DIAGNOSIS — R0989 Other specified symptoms and signs involving the circulatory and respiratory systems: Secondary | ICD-10-CM

## 2012-07-08 DIAGNOSIS — Z8719 Personal history of other diseases of the digestive system: Secondary | ICD-10-CM

## 2012-07-08 DIAGNOSIS — I4729 Other ventricular tachycardia: Secondary | ICD-10-CM | POA: Diagnosis not present

## 2012-07-08 DIAGNOSIS — K219 Gastro-esophageal reflux disease without esophagitis: Secondary | ICD-10-CM | POA: Diagnosis present

## 2012-07-08 DIAGNOSIS — E039 Hypothyroidism, unspecified: Secondary | ICD-10-CM | POA: Diagnosis present

## 2012-07-08 DIAGNOSIS — L03119 Cellulitis of unspecified part of limb: Secondary | ICD-10-CM | POA: Diagnosis present

## 2012-07-08 DIAGNOSIS — L02419 Cutaneous abscess of limb, unspecified: Secondary | ICD-10-CM | POA: Diagnosis present

## 2012-07-08 DIAGNOSIS — K761 Chronic passive congestion of liver: Secondary | ICD-10-CM | POA: Diagnosis present

## 2012-07-08 DIAGNOSIS — I10 Essential (primary) hypertension: Secondary | ICD-10-CM

## 2012-07-08 DIAGNOSIS — Z79899 Other long term (current) drug therapy: Secondary | ICD-10-CM

## 2012-07-08 DIAGNOSIS — N281 Cyst of kidney, acquired: Secondary | ICD-10-CM | POA: Diagnosis present

## 2012-07-08 DIAGNOSIS — D696 Thrombocytopenia, unspecified: Secondary | ICD-10-CM | POA: Diagnosis present

## 2012-07-08 DIAGNOSIS — I428 Other cardiomyopathies: Secondary | ICD-10-CM | POA: Diagnosis present

## 2012-07-08 DIAGNOSIS — I2510 Atherosclerotic heart disease of native coronary artery without angina pectoris: Secondary | ICD-10-CM | POA: Diagnosis present

## 2012-07-08 DIAGNOSIS — R0602 Shortness of breath: Secondary | ICD-10-CM | POA: Diagnosis present

## 2012-07-08 DIAGNOSIS — Z880 Allergy status to penicillin: Secondary | ICD-10-CM

## 2012-07-08 DIAGNOSIS — R0600 Dyspnea, unspecified: Secondary | ICD-10-CM

## 2012-07-08 DIAGNOSIS — R7402 Elevation of levels of lactic acid dehydrogenase (LDH): Secondary | ICD-10-CM | POA: Diagnosis present

## 2012-07-08 DIAGNOSIS — G9340 Encephalopathy, unspecified: Secondary | ICD-10-CM | POA: Diagnosis present

## 2012-07-08 DIAGNOSIS — IMO0002 Reserved for concepts with insufficient information to code with codable children: Secondary | ICD-10-CM

## 2012-07-08 DIAGNOSIS — E8809 Other disorders of plasma-protein metabolism, not elsewhere classified: Secondary | ICD-10-CM | POA: Diagnosis present

## 2012-07-08 DIAGNOSIS — E2749 Other adrenocortical insufficiency: Secondary | ICD-10-CM | POA: Diagnosis present

## 2012-07-08 DIAGNOSIS — I4949 Other premature depolarization: Secondary | ICD-10-CM | POA: Diagnosis not present

## 2012-07-08 DIAGNOSIS — E876 Hypokalemia: Secondary | ICD-10-CM

## 2012-07-08 DIAGNOSIS — I5033 Acute on chronic diastolic (congestive) heart failure: Principal | ICD-10-CM

## 2012-07-08 LAB — URINALYSIS, ROUTINE W REFLEX MICROSCOPIC
Glucose, UA: NEGATIVE mg/dL
Leukocytes, UA: NEGATIVE
Protein, ur: NEGATIVE mg/dL
Specific Gravity, Urine: 1.018 (ref 1.005–1.030)

## 2012-07-08 LAB — CBC
MCH: 27.2 pg (ref 26.0–34.0)
MCHC: 33.4 g/dL (ref 30.0–36.0)
Platelets: 143 10*3/uL — ABNORMAL LOW (ref 150–400)
RDW: 20 % — ABNORMAL HIGH (ref 11.5–15.5)

## 2012-07-08 LAB — COMPREHENSIVE METABOLIC PANEL
ALT: 30 U/L (ref 0–53)
AST: 71 U/L — ABNORMAL HIGH (ref 0–37)
Albumin: 3.6 g/dL (ref 3.5–5.2)
Calcium: 9.3 mg/dL (ref 8.4–10.5)
GFR calc Af Amer: 69 mL/min — ABNORMAL LOW (ref >90)
Glucose, Bld: 114 mg/dL — ABNORMAL HIGH (ref 70–99)
Potassium: 2.9 mEq/L — ABNORMAL LOW (ref 3.5–5.1)
Sodium: 135 mEq/L (ref 135–145)
Total Protein: 7.3 g/dL (ref 6.0–8.3)

## 2012-07-08 LAB — MAGNESIUM: Magnesium: 1.6 mg/dL (ref 1.5–2.5)

## 2012-07-08 MED ORDER — POTASSIUM CHLORIDE CRYS ER 20 MEQ PO TBCR
40.0000 meq | EXTENDED_RELEASE_TABLET | Freq: Once | ORAL | Status: AC
  Administered 2012-07-08: 40 meq via ORAL
  Filled 2012-07-08: qty 2

## 2012-07-08 MED ORDER — ACETAMINOPHEN 325 MG PO TABS: 650.0000 mg | ORAL_TABLET | Freq: Four times a day (QID) | ORAL | Status: DC | PRN

## 2012-07-08 MED ORDER — ALBUTEROL SULFATE (5 MG/ML) 0.5% IN NEBU: 2.5000 mg | INHALATION_SOLUTION | RESPIRATORY_TRACT | Status: DC | PRN

## 2012-07-08 MED ORDER — ISOSORBIDE MONONITRATE ER 60 MG PO TB24
60.0000 mg | ORAL_TABLET | Freq: Every day | ORAL | Status: DC
  Filled 2012-07-08 (×2): qty 1

## 2012-07-08 MED ORDER — ACETAMINOPHEN 650 MG RE SUPP: 650.0000 mg | Freq: Four times a day (QID) | RECTAL | Status: DC | PRN

## 2012-07-08 MED ORDER — ASPIRIN EC 81 MG PO TBEC
81.0000 mg | DELAYED_RELEASE_TABLET | Freq: Every day | ORAL | Status: DC
  Administered 2012-07-09 – 2012-07-16 (×8): 81 mg via ORAL
  Filled 2012-07-08 (×9): qty 1

## 2012-07-08 MED ORDER — SODIUM CHLORIDE 0.9 % IV SOLN
INTRAVENOUS | Status: DC
  Administered 2012-07-08: 12:00:00 via INTRAVENOUS

## 2012-07-08 MED ORDER — ADULT MULTIVITAMIN W/MINERALS CH
1.0000 | ORAL_TABLET | Freq: Every day | ORAL | Status: DC
  Administered 2012-07-09 – 2012-07-15 (×7): 1 via ORAL
  Filled 2012-07-08 (×9): qty 1

## 2012-07-08 MED ORDER — ONDANSETRON HCL 4 MG PO TABS: 4.0000 mg | ORAL_TABLET | Freq: Four times a day (QID) | ORAL | Status: DC | PRN

## 2012-07-08 MED ORDER — MULTIVITAMINS PO TABS: 1.0000 | ORAL_TABLET | Freq: Every day | ORAL | Status: DC

## 2012-07-08 MED ORDER — MAGNESIUM SULFATE IN D5W 10-5 MG/ML-% IV SOLN
1.0000 g | Freq: Once | INTRAVENOUS | Status: AC
  Administered 2012-07-09: 1 g via INTRAVENOUS
  Filled 2012-07-08: qty 100

## 2012-07-08 MED ORDER — POTASSIUM CHLORIDE CRYS ER 20 MEQ PO TBCR
20.0000 meq | EXTENDED_RELEASE_TABLET | Freq: Every day | ORAL | Status: DC
  Administered 2012-07-08: 20 meq via ORAL
  Filled 2012-07-08 (×2): qty 1

## 2012-07-08 MED ORDER — IPRATROPIUM BROMIDE 0.02 % IN SOLN
0.5000 mg | RESPIRATORY_TRACT | Status: DC | PRN
  Filled 2012-07-08: qty 2.5

## 2012-07-08 MED ORDER — HYDROCODONE-ACETAMINOPHEN 5-325 MG PO TABS: 1.0000 | ORAL_TABLET | ORAL | Status: DC | PRN

## 2012-07-08 MED ORDER — ONDANSETRON HCL 4 MG/2ML IJ SOLN
4.0000 mg | Freq: Four times a day (QID) | INTRAMUSCULAR | Status: DC | PRN
  Administered 2012-07-08 – 2012-07-16 (×2): 4 mg via INTRAVENOUS
  Filled 2012-07-08 (×2): qty 2

## 2012-07-08 MED ORDER — SODIUM CHLORIDE 0.9 % IJ SOLN
3.0000 mL | Freq: Two times a day (BID) | INTRAMUSCULAR | Status: DC
  Administered 2012-07-08 – 2012-07-16 (×16): 3 mL via INTRAVENOUS

## 2012-07-08 MED ORDER — AMLODIPINE BESYLATE 5 MG PO TABS
5.0000 mg | ORAL_TABLET | Freq: Every day | ORAL | Status: DC
  Filled 2012-07-08 (×2): qty 1

## 2012-07-08 MED ORDER — FUROSEMIDE 10 MG/ML IJ SOLN
60.0000 mg | Freq: Once | INTRAMUSCULAR | Status: AC
  Administered 2012-07-08: 60 mg via INTRAVENOUS
  Filled 2012-07-08: qty 8

## 2012-07-08 MED ORDER — CARVEDILOL 6.25 MG PO TABS
6.2500 mg | ORAL_TABLET | Freq: Two times a day (BID) | ORAL | Status: DC
  Administered 2012-07-09: 6.25 mg via ORAL
  Filled 2012-07-08 (×4): qty 1

## 2012-07-08 MED ORDER — LEVOTHYROXINE SODIUM 125 MCG PO TABS
125.0000 ug | ORAL_TABLET | Freq: Every morning | ORAL | Status: DC
  Administered 2012-07-09 – 2012-07-16 (×8): 125 ug via ORAL
  Filled 2012-07-08 (×8): qty 1

## 2012-07-08 NOTE — Progress Notes (Signed)
Pt had 5 more beats of VTACH. Paged MD. Strip in chart.

## 2012-07-08 NOTE — Progress Notes (Signed)
Pt had 9 beats of VTACH. PT asymptomatic. Pt BP stable. Notified MD

## 2012-07-08 NOTE — ED Notes (Signed)
He c/o shortness of breath x "several weeks".

## 2012-07-08 NOTE — ED Provider Notes (Signed)
History    CSN: [id number] Arrival date & time 07/08/12  1031  First MD Initiated Contact with Patient 07/08/12 1047     Chief Complaint  Patient presents with  . Shortness of Breath   (Consider location/radiation/quality/duration/timing/severity/associated sxs/prior Treatment) Patient is a 79 y.o. male presenting with shortness of breath. The history is provided by the patient.  Shortness of Breath Associated symptoms: cough   Associated symptoms: no abdominal pain, no chest pain, no fever, no headaches, no neck pain and no rash   pt w hx chf, presents w progressive dyspnea x 2 weeks, gradual onset, slowly worse, moderate, worse w activity. Now sob even at rest. +orthopnea. No pnd. No chest pain or discomfort. Occasional non prod cough. No fever or chills. Increased bil lower leg swelling, no leg pain. Has generally been compliant w meds, but family states intermittent nausea and decreased po intake, so possibly has missed some doses of meds.     Past Medical History  Diagnosis Date  . Diastolic dysfunction   . History of pituitary tumor   . Pituitary insufficiency   . Hypothyroidism   . GERD (gastroesophageal reflux disease)   . Splenic flexure syndrome   . CHF (congestive heart failure)     with diastolic dysfunction  . Hiatal hernia   . Coronary artery disease   . Cardiomyopathy   . Diverticulitis     recurrent  . Nephrolithiasis     right-sided  . Fatty liver   . Gout   . Arthritis   . Exogenous obesity   . Personal history of colonic polyps 07/23/2008    TUBULAR ADENOMA  . Ureteral stone     distal ureteral stone   . Thrombocytopenia    Past Surgical History  Procedure Laterality Date  . Transphenoidal pituitary resection  2001  . Cholecystectomy, laparoscopic  2004  . Esophageal dilation  12/2006    endoscopy with dilatation of esophageal stricture   Family History  Problem Relation Age of Onset  . Colon cancer Brother   . Cancer Sister     ?  .  Stroke Father   . Skin cancer Mother    History  Substance Use Topics  . Smoking status: Former Smoker    Quit date: 01/11/1962  . Smokeless tobacco: Never Used  . Alcohol Use: No    Review of Systems  Constitutional: Negative for fever.  HENT: Negative for neck pain.   Eyes: Negative for visual disturbance.  Respiratory: Positive for cough and shortness of breath.   Cardiovascular: Positive for leg swelling. Negative for chest pain.  Gastrointestinal: Negative for abdominal pain.  Genitourinary: Negative for flank pain.  Musculoskeletal: Negative for back pain.  Skin: Negative for rash.  Neurological: Negative for headaches.  Hematological: Does not bruise/bleed easily.  Psychiatric/Behavioral: Negative for confusion.    Allergies  Penicillins  Home Medications   Current Outpatient Rx  Name  Route  Sig  Dispense  Refill  . amLODipine (NORVASC) 5 MG tablet   Oral   Take 1 tablet (5 mg total) by mouth daily.   30 tablet   11   . aspirin 81 MG tablet   Oral   Take 81 mg by mouth daily.           . carvedilol (COREG) 6.25 MG tablet   Oral   Take 1 tablet (6.25 mg total) by mouth 2 (two) times daily with a meal.   60 tablet   11   .  furosemide (LASIX) 40 MG tablet      Take 2 tab by mouth twice a day.   120 tablet   6   . hydrocortisone (CORTEF) 20 MG tablet   Oral   Take 10 mg by mouth 2 (two) times daily.          . isosorbide mononitrate (IMDUR) 60 MG 24 hr tablet   Oral   Take 60 mg by mouth daily.           Marland Kitchen levofloxacin (LEVAQUIN) 500 MG tablet   Oral   Take 1 tablet (500 mg total) by mouth daily.   5 tablet   0   . levothyroxine (SYNTHROID, LEVOTHROID) 50 MCG tablet   Oral   Take 50 mcg by mouth daily.           . multivitamin (THERAGRAN) per tablet   Oral   Take 1 tablet by mouth daily.           . potassium chloride SA (K-DUR,KLOR-CON) 20 MEQ tablet   Oral   Take 1 tablet (20 mEq total) by mouth daily.   30 tablet   11    . predniSONE (DELTASONE) 20 MG tablet   Oral   Take 20 mg by mouth daily. Take 2 tablets by mouth for 2 days and then 1 tablet daily for 6 days.          BP 106/46  Pulse 94  Temp(Src) 97.8 F (36.6 C) (Oral)  SpO2 95% Physical Exam  Nursing note and vitals reviewed. Constitutional: He is oriented to person, place, and time. He appears well-developed and well-nourished. No distress.  HENT:  Head: Atraumatic.  Mouth/Throat: Oropharynx is clear and moist.  Eyes: Conjunctivae are normal.  Neck: Neck supple. No JVD present. No tracheal deviation present.  Cardiovascular: Normal rate, regular rhythm, normal heart sounds and intact distal pulses.   Pulmonary/Chest: Effort normal. No accessory muscle usage. No respiratory distress.  Rales bases  Abdominal: Soft. Bowel sounds are normal. He exhibits no distension. There is no tenderness.  obese  Musculoskeletal: Normal range of motion. He exhibits edema. He exhibits no tenderness.  Neurological: He is alert and oriented to person, place, and time.  Skin: Skin is warm and dry.  Psychiatric: He has a normal mood and affect.    ED Course  Procedures (including critical care time)  Results for orders placed during the hospital encounter of 07/08/12  CBC      Result Value Range   WBC 7.1  4.0 - 10.5 K/uL   RBC 6.28 (*) 4.22 - 5.81 MIL/uL   Hemoglobin 17.1 (*) 13.0 - 17.0 g/dL   HCT 82.9  56.2 - 13.0 %   MCV 81.5  78.0 - 100.0 fL   MCH 27.2  26.0 - 34.0 pg   MCHC 33.4  30.0 - 36.0 g/dL   RDW 86.5 (*) 78.4 - 69.6 %   Platelets 143 (*) 150 - 400 K/uL  COMPREHENSIVE METABOLIC PANEL      Result Value Range   Sodium 135  135 - 145 mEq/L   Potassium 2.9 (*) 3.5 - 5.1 mEq/L   Chloride 94 (*) 96 - 112 mEq/L   CO2 25  19 - 32 mEq/L   Glucose, Bld 114 (*) 70 - 99 mg/dL   BUN 7  6 - 23 mg/dL   Creatinine, Ser 2.95  0.50 - 1.35 mg/dL   Calcium 9.3  8.4 - 28.4 mg/dL   Total Protein 7.3  6.0 - 8.3 g/dL   Albumin 3.6  3.5 - 5.2 g/dL    AST 71 (*) 0 - 37 U/L   ALT 30  0 - 53 U/L   Alkaline Phosphatase 139 (*) 39 - 117 U/L   Total Bilirubin 3.2 (*) 0.3 - 1.2 mg/dL   GFR calc non Af Amer 60 (*) >90 mL/min   GFR calc Af Amer 69 (*) >90 mL/min  PRO B NATRIURETIC PEPTIDE      Result Value Range   Pro B Natriuretic peptide (BNP) 232.4  0 - 450 pg/mL  TROPONIN I      Result Value Range   Troponin I <0.30  <0.30 ng/mL   Dg Chest 2 View  07/08/2012   *RADIOLOGY REPORT*  Clinical Data: Cough and body aches  CHEST - 2 VIEW  Comparison: 05/15/2012  Findings: Mild cardiomegaly.  Low volumes.  Vascular congestion. Interstitial edema has not significantly changed.  Elevation of the right hemidiaphragm is noted.  No pneumothorax. Lower thoracic vertebral plana deformity is again noted.  IMPRESSION: Stable vascular congestion and interstitial edema.   Original Report Authenticated By: Jolaine Click, M.D.      MDM  Iv ns. Labs. Cxr.  Reviewed nursing notes and prior charts for additional history.    Date: 07/08/2012  Rate: 76  Rhythm: normal sinus rhythm  QRS Axis: left  Intervals: normal  ST/T Wave abnormalities: nonspecific ST/T changes  Conduction Disutrbances:right bundle branch block  Narrative Interpretation:   Old EKG Reviewed: unchanged  Lasix iv.  Given dyspnea, chf/edema, will admit.  As recent admit to cardiology service and seen by them in office, will contact cardiology first.  Discussed w Dr  Armanda Magic - she requests we call hospitalist service to admit.  [facility] hospitalist called.      Suzi Roots, MD 07/08/12 1325

## 2012-07-08 NOTE — Progress Notes (Signed)
MD notified of pt's low bp readings. Said to hold all bp meds. Vwilliams,rn.

## 2012-07-08 NOTE — H&P (Signed)
[facility] History and Physical  Scott Mooney ZOX:[id number] DOB: [birth year] DOA: 07/08/2012  Referring physician: ER physician PCP: Garlan Fillers, MD   Chief Complaint: shortness of breath  HPI:  79 y.o. male with past medical history of diastolic CHF, hypertension, hypothyroidism who presented to [facility] ED 07/08/2012 with complaints of worsening shortness of breath for past few days prior to this admission associated with non productive cough. No complaints of chest pain, fever or chills. No palpitations. Patient did endorse worsening swelling in lower extremities but no associated pain. No abdominal pain, no nausea or vomiting. No lightheadedness or dizziness or loss of consciousness. In ED, vital signs remain stable. CXR showed vascular congestion and interstitial edema. BMP revealed hypokalemia of 2.9 and this was supplemented in ED. Patient was alos given lasix 60 mg IV one dose. BNP was 232. CBC revealed platelet counts of 143.   Assessment and Plan:  Principal Problem:   Shortness of breath - likely due to vascular congestion, diastolic CHF - last 2 D ECHO on file in 02/2012 with normal EF - BNP on this admission was 232 - patient already given lasix 60 mg IV x once in ED> Due to borderline low BP we will not give further diuretics Active Problems:   Hypothyroidism - continue synthroid   Essential hypertension - BP on soft side so we will hold of on coreg and Imdur; may give Norvasc in am if SBP 120 and above   Chronic diastolic heart failure - as mentioned above BNP WNL on this admission - no need for another 2 D ECHO as pt had it in 02/2012 with normal EF  Manson Passey [facility] [phone]  Review of Systems:  Constitutional: Negative for fever, chills and malaise/fatigue. Negative for diaphoresis.  HENT: Negative for hearing loss, ear pain, nosebleeds, congestion, sore throat, neck pain, tinnitus and ear discharge.   Eyes: Negative for blurred vision, double vision,  photophobia, pain, discharge and redness.  Respiratory: positive for cough, no hemoptysis, sputum production, positive for shortness of breath, no wheezing and stridor.   Cardiovascular: Negative for chest pain, palpitations, orthopnea, claudication and leg swelling.  Gastrointestinal: Negative for nausea, vomiting and abdominal pain. Negative for heartburn, constipation, blood in stool and melena.  Genitourinary: Negative for dysuria, urgency, frequency, hematuria and flank pain.  Musculoskeletal: Negative for myalgias, back pain, joint pain and falls.  Skin: Negative for itching and rash.  Neurological: Negative for dizziness and weakness. Negative for tingling, tremors, sensory change, speech change, focal weakness, loss of consciousness and headaches.  Endo/Heme/Allergies: Negative for environmental allergies and polydipsia. Does not bruise/bleed easily.  Psychiatric/Behavioral: Negative for suicidal ideas. The patient is not nervous/anxious.      Past Medical History  Diagnosis Date  . Diastolic dysfunction   . History of pituitary tumor   . Pituitary insufficiency   . Hypothyroidism   . GERD (gastroesophageal reflux disease)   . Splenic flexure syndrome   . CHF (congestive heart failure)     with diastolic dysfunction  . Hiatal hernia   . Coronary artery disease   . Cardiomyopathy   . Diverticulitis     recurrent  . Nephrolithiasis     right-sided  . Fatty liver   . Gout   . Arthritis   . Exogenous obesity   . Personal history of colonic polyps 07/23/2008    TUBULAR ADENOMA  . Ureteral stone     distal ureteral stone   . Thrombocytopenia    Past Surgical History  Procedure Laterality Date  . Transphenoidal pituitary resection  2001  . Cholecystectomy, laparoscopic  2004  . Esophageal dilation  12/2006    endoscopy with dilatation of esophageal stricture   Social History:  reports that he quit smoking about 50 years ago. He has never used smokeless tobacco. He  reports that he does not drink alcohol or use illicit drugs.  Allergies  Allergen Reactions  . Penicillins Swelling and Other (See Comments)    Head and feet swelling, spent 5 days in hospital as a result of taking med    Family History:  Family History  Problem Relation Age of Onset  . Colon cancer Brother   . Cancer Sister     ?  . Stroke Father   . Skin cancer Mother      Prior to Admission medications   Medication Sig Start Date End Date Taking? Authorizing Provider  amLODipine (NORVASC) 5 MG tablet Take 1 tablet (5 mg total) by mouth daily. 05/18/12  Yes Rhonda G Barrett, PA-C  aspirin EC 81 MG tablet Take 81 mg by mouth daily.   Yes Historical Provider, MD  carvedilol (COREG) 6.25 MG tablet Take 1 tablet (6.25 mg total) by mouth 2 (two) times daily with a meal. 05/18/12  Yes Rhonda G Barrett, PA-C  furosemide (LASIX) 40 MG tablet Take 2 tab by mouth twice a day. 06/28/12  Yes Cassell Clement, MD  levothyroxine (SYNTHROID, LEVOTHROID) 125 MCG tablet Take 125 mcg by mouth every morning.   Yes Historical Provider, MD  multivitamin [facility]) per tablet Take 1 tablet by mouth daily.     Yes Historical Provider, MD  potassium chloride SA (K-DUR,KLOR-CON) 20 MEQ tablet Take 1 tablet (20 mEq total) by mouth daily. 05/18/12  Yes Rhonda G Barrett, PA-C  isosorbide mononitrate (IMDUR) 60 MG 24 hr tablet Take 60 mg by mouth daily.      Historical Provider, MD   Physical Exam: Filed Vitals:   07/08/12 1511 07/08/12 1514 07/08/12 1838 07/08/12 1840  BP: 126/51  89/27 100/22  Pulse: 83  86 84  Temp: 99.2 F (37.3 C)     TempSrc: Oral     Resp: 20     Height:  5\' 4"  (1.626 m)    Weight:  107.366 kg (236 lb 11.2 oz)    SpO2: 94%       Physical Exam  Constitutional: Appears well-developed and well-nourished. No distress.  HENT: Normocephalic. No tonsillar erythema or exudates. Eyes: Conjunctivae and EOM are normal. PERRLA, no scleral icterus.  Neck: Normal ROM. Neck supple. No JVD. No  tracheal deviation. No thyromegaly.  CVS: RRR, S1/S2 +, no murmurs, no gallops, no carotid bruit.  Pulmonary: Effort and breath sounds normal, no stridor, rhonchi, wheezes, rales.  Abdominal: Soft. BS +,  no distension, tenderness, rebound or guarding.  Musculoskeletal: Normal range of motion. LE edema and no tenderness.  Lymphadenopathy: No lymphadenopathy noted, cervical, inguinal. Neuro: Alert. Normal reflexes, muscle tone coordination. No cranial nerve deficit. Skin: Skin is warm and dry. No rash noted. Not diaphoretic. No erythema. No pallor.  Psychiatric: Normal mood and affect. Behavior, judgment, thought content normal.   Labs on Admission:  Basic Metabolic Panel:  Recent Labs Lab 07/08/12 1056  NA 135  K 2.9*  CL 94*  CO2 25  GLUCOSE 114*  BUN 7  CREATININE 1.12  CALCIUM 9.3   Liver Function Tests:  Recent Labs Lab 07/08/12 1056  AST 71*  ALT 30  ALKPHOS 139*  BILITOT  3.2*  PROT 7.3  ALBUMIN 3.6   No results found for this basename: LIPASE, AMYLASE,  in the last 168 hours No results found for this basename: AMMONIA,  in the last 168 hours CBC:  Recent Labs Lab 07/08/12 1056  WBC 7.1  HGB 17.1*  HCT 51.2  MCV 81.5  PLT 143*   Cardiac Enzymes:  Recent Labs Lab 07/08/12 1056  TROPONINI <0.30   BNP: No components found with this basename: POCBNP,  CBG: No results found for this basename: GLUCAP,  in the last 168 hours  Radiological Exams on Admission: Dg Chest 2 View 07/08/2012   * IMPRESSION: Stable vascular congestion and interstitial edema.   Original Report Authenticated By: Jolaine Click, M.D.    EKG: Normal sinus rhythm, no ST/T wave changes  Code Status: Full Family Communication: Pt at bedside Disposition Plan: Admit for further evaluation  Manson Passey, MD  [facility] Pager [phone]  If 7PM-7AM, please contact night-coverage [email or web address] Password [facility] 07/08/2012, 8:05 PM

## 2012-07-09 ENCOUNTER — Inpatient Hospital Stay (HOSPITAL_COMMUNITY): Payer: Medicare Other

## 2012-07-09 DIAGNOSIS — E039 Hypothyroidism, unspecified: Secondary | ICD-10-CM

## 2012-07-09 DIAGNOSIS — E876 Hypokalemia: Secondary | ICD-10-CM

## 2012-07-09 LAB — COMPREHENSIVE METABOLIC PANEL
AST: 57 U/L — ABNORMAL HIGH (ref 0–37)
BUN: 8 mg/dL (ref 6–23)
CO2: 25 mEq/L (ref 19–32)
Chloride: 99 mEq/L (ref 96–112)
Creatinine, Ser: 1.19 mg/dL (ref 0.50–1.35)
GFR calc Af Amer: 64 mL/min — ABNORMAL LOW (ref >90)
GFR calc non Af Amer: 55 mL/min — ABNORMAL LOW (ref >90)
Glucose, Bld: 113 mg/dL — ABNORMAL HIGH (ref 70–99)
Total Bilirubin: 2.7 mg/dL — ABNORMAL HIGH (ref 0.3–1.2)

## 2012-07-09 LAB — CBC
MCH: 26.5 pg (ref 26.0–34.0)
Platelets: 135 10*3/uL — ABNORMAL LOW (ref 150–400)
RBC: 5.78 MIL/uL (ref 4.22–5.81)
WBC: 5.9 10*3/uL (ref 4.0–10.5)

## 2012-07-09 LAB — TSH: TSH: 0.05 u[IU]/mL — ABNORMAL LOW (ref 0.350–4.500)

## 2012-07-09 MED ORDER — FUROSEMIDE 10 MG/ML IJ SOLN
40.0000 mg | Freq: Two times a day (BID) | INTRAMUSCULAR | Status: DC
  Administered 2012-07-09 – 2012-07-10 (×3): 40 mg via INTRAVENOUS
  Filled 2012-07-09 (×6): qty 4

## 2012-07-09 MED ORDER — POTASSIUM CHLORIDE CRYS ER 20 MEQ PO TBCR
40.0000 meq | EXTENDED_RELEASE_TABLET | Freq: Once | ORAL | Status: AC
  Administered 2012-07-09: 40 meq via ORAL
  Filled 2012-07-09: qty 2

## 2012-07-09 MED ORDER — DOXYCYCLINE HYCLATE 100 MG PO TABS
100.0000 mg | ORAL_TABLET | Freq: Two times a day (BID) | ORAL | Status: DC
  Administered 2012-07-09 – 2012-07-16 (×15): 100 mg via ORAL
  Filled 2012-07-09 (×16): qty 1

## 2012-07-09 MED ORDER — MAGNESIUM SULFATE 40 MG/ML IJ SOLN
2.0000 g | Freq: Once | INTRAMUSCULAR | Status: AC
  Administered 2012-07-09: 2 g via INTRAVENOUS
  Filled 2012-07-09: qty 50

## 2012-07-09 NOTE — Consult Note (Addendum)
Admit date: 07/08/2012 Referring Physician  Dr. Carmell Austria Primary Physician  Dr. Eloise Harman Primary Cardiologist  Dr. Patty Sermons Reason for Consultation  CHF and hypotension  HPI: 79 y.o. male with past medical history of diastolic CHF, hypertension, hypothyroidism who presented to [facility] ED 07/08/2012 with complaints of worsening shortness of breath for past few days prior to this admission associated with non productive cough. No complaints of chest pain, fever or chills. No palpitations. Patient did endorse worsening swelling in lower extremities but no associated pain. No abdominal pain, no nausea or vomiting. No lightheadedness or dizziness or loss of consciousness.  In ED, vital signs remained stable. CXR showed vascular congestion and interstitial edema. BMP revealed hypokalemia of 2.9 and this was supplemented in ED. Patient was also given lasix 60 mg IV one dose. BNP was 232. CBC revealed platelet counts of 143.   The patient was recently discharged home [facility] after treatment for acute on chronic diastolic CHF class 4.  At that time he diuresed 3.5L and had symptomatic improvement in his SOB.  His O2 Sat at d/c was 96%.  He was treated with a 7 day course of levofloxacin with nebulizer for possible acute bronchitis.  He ruled out for MI on that admission.  Creatinine at time of d/c was 1.24.  On 6/18 patient was noted to have gained 11 pounds in 7 days and increased SOB and wheezing.  He was told to increase his Lasix to 80mg  BID.  Since then he has not gotten any significant improvement and presented in acute on chronic CHF last night.  He complains of orthopnea but no PND.  He denies any chest pain/fever or chills but occaionally has some nonproductive cough.  He has also had intermittent nausea with decreased PO intake.  His Coreg and Imdur were placed on hold due to soft BP.  He received IV Lasix last PM.Cardiolog is now asked to help with management.         PMH:   Past Medical History  Diagnosis  Date  . Diastolic dysfunction   . History of pituitary tumor   . Pituitary insufficiency   . Hypothyroidism   . GERD (gastroesophageal reflux disease)   . Splenic flexure syndrome   . CHF (congestive heart failure)     with diastolic dysfunction  . Hiatal hernia   . Coronary artery disease   . Cardiomyopathy   . Diverticulitis     recurrent  . Nephrolithiasis     right-sided  . Fatty liver   . Gout   . Arthritis   . Exogenous obesity   . Personal history of colonic polyps 07/23/2008    TUBULAR ADENOMA  . Ureteral stone     distal ureteral stone   . Thrombocytopenia      PSH:   Past Surgical History  Procedure Laterality Date  . Transphenoidal pituitary resection  2001  . Cholecystectomy, laparoscopic  2004  . Esophageal dilation  12/2006    endoscopy with dilatation of esophageal stricture    Allergies:  Penicillins Prior to Admit Meds:   Prescriptions prior to admission  Medication Sig Dispense Refill  . amLODipine (NORVASC) 5 MG tablet Take 1 tablet (5 mg total) by mouth daily.  30 tablet  11  . aspirin EC 81 MG tablet Take 81 mg by mouth daily.      . carvedilol (COREG) 6.25 MG tablet Take 1 tablet (6.25 mg total) by mouth 2 (two) times daily with a meal.  60 tablet  11  . furosemide (LASIX) 40 MG tablet Take 2 tab by mouth twice a day.  120 tablet  6  . levothyroxine (SYNTHROID, LEVOTHROID) 125 MCG tablet Take 125 mcg by mouth every morning.      . multivitamin (THERAGRAN) per tablet Take 1 tablet by mouth daily.        . potassium chloride SA (K-DUR,KLOR-CON) 20 MEQ tablet Take 1 tablet (20 mEq total) by mouth daily.  30 tablet  11  . isosorbide mononitrate (IMDUR) 60 MG 24 hr tablet Take 60 mg by mouth daily.         Fam HX:    Family History  Problem Relation Age of Onset  . Colon cancer Brother   . Cancer Sister     ?  . Stroke Father   . Skin cancer Mother    Social HX:    History   Social History  . Marital Status: Married    Spouse Name: N/A     Number of Children: 1  . Years of Education: N/A   Occupational History  . retired    Social History Main Topics  . Smoking status: Former Smoker    Quit date: 01/11/1962  . Smokeless tobacco: Never Used  . Alcohol Use: No  . Drug Use: No  . Sexually Active: Not on file   Other Topics Concern  . Not on file   Social History Narrative  . No narrative on file     ROS:  All 11 ROS were addressed and are negative except what is stated in the HPI  Physical Exam: Blood pressure 102/53, pulse 90, temperature 97.8 F (36.6 C), temperature source Oral, resp. rate 20, height 5\' 4"  (1.626 m), weight 108.2 kg (238 lb 8.6 oz), SpO2 96.00%.    General: Well developed, well nourished, in no acute distress Head: Eyes PERRLA, No xanthomas.   Normal cephalic and atramatic  Lungs:   Decreased BS at bases and crackles at right base Heart:   HRRR S1 S2 Pulses are 2+ & equal. Abdomen: Bowel sounds are positive, abdomen soft and non-tender without masses  Extremities: 1+ pedal edema Neuro: Alert and oriented X 3. Psych:  Good affect, responds appropriately    Labs:   Lab Results  Component Value Date   WBC 5.9 07/09/2012   HGB 15.3 07/09/2012   HCT 47.2 07/09/2012   MCV 81.7 07/09/2012   PLT 135* 07/09/2012    Recent Labs Lab 07/09/12 0517  NA 135  K 3.3*  CL 99  CO2 25  BUN 8  CREATININE 1.19  CALCIUM 8.4  PROT 6.1  BILITOT 2.7*  ALKPHOS 115  ALT 23  AST 57*  GLUCOSE 113*   No results found for this basename: PTT   No results found for this basename: INR, PROTIME   Lab Results  Component Value Date   CKTOTAL 35 07/04/2010   CKMB 1.5 07/04/2010   TROPONINI <0.30 07/08/2012         Radiology:  Dg Chest 2 View  07/08/2012   *RADIOLOGY REPORT*  Clinical Data: Cough and body aches  CHEST - 2 VIEW  Comparison: 05/15/2012  Findings: Mild cardiomegaly.  Low volumes.  Vascular congestion. Interstitial edema has not significantly changed.  Elevation of the right hemidiaphragm  is noted.  No pneumothorax. Lower thoracic vertebral plana deformity is again noted.  IMPRESSION: Stable vascular congestion and interstitial edema.   Original Report Authenticated By: Jolaine Click, M.D.    EKG:  Pending  ASSESSMENT:  1.  Acute on chronic diastolic CHF - received IV Lasix in ER last night and has put out 400cc.  He says that he has not been using extra sodium and has not missed any Lasix doses but he is a poor historian 2.  SOB secondary to #1 3.  Normal LVF 4.  HTN with borderline low BP 5.  Hypokalemia 6.  Dietary noncompliance  PLAN:   1.  Agree with holding Coreg/Imdur and Norvasc secondary to low BP 2.  Continue IV diuresis as BP tolerates 3.  Replete potassium - per Hospitalist  Quintella Reichert, MD  07/09/2012  10:28 AM

## 2012-07-09 NOTE — Progress Notes (Signed)
[facility] PROGRESS NOTE  DIGBY GROENEVELD ZOX:[id number] DOB: [birth year] DOA: 07/08/2012 PCP: Garlan Fillers, MD  Assessment/Plan:  Acute on chronic  diastolic heart failure exacerbation:  - Patient presents with SOB likely due to vascular congestion, diastolic CHF  - Last 2 D ECHO on file in 02/2012 with normal EF  - BNP on this admission was 232  - Patient recieved lasix 60 mg IV x once in ED -Will consult cardiology, we might have limitation with diuresis due to soft SBP.  -Will defer lasix doses to cardiologist.  -Will hold Imdur and Norvasc due to soft, SBP.  -Weight on admission 107 kg, prior admission  Weight was at : 110 Kg (05-15-2012).  Hypothyroidism  - continue synthroid  -Repeat TSH and Free T3.   Essential hypertension  -Will Hold Norvasc, Imdur due to soft SBP.  -Continue with Coreg with holder parameters.   Lower extremities cellulitis: redness B/L LE: will start doxy to cover for infection.  Transaminases:  increase bilirubin: Could be secondary to hepatic congestion. Will order RUQ [place]. Bilirubin decrease to 2.7 from 3.2. Asymptomatic VT: Replete Potasium, and Mg. Continue with Coreg.  Chronic Thrombocytopenia; Monitor.  Hypotension: Hold Norvasc, Imdur. Treat cellulitis. Check lactic acid. No IV fluids due to heart Failure.    Code Status: Full Code.  Family Communication: Care discussed with patient.  Disposition Plan: Remain inpatient.    Consultants:  [facility].   Procedures:  None  Antibiotics:  Doxycycline 6-29.   HPI/Subjective: Relates SOB for last couple of days, worse yesterday. He feels better today. Breathing better.  Denies chest pain.   Objective: Filed Vitals:   07/08/12 1838 07/08/12 1840 07/08/12 2109 07/09/12 0630  BP: 89/27 100/22 110/43 102/53  Pulse: 86 84 80 90  Temp:   98.4 F (36.9 C) 97.8 F (36.6 C)  TempSrc:   Oral Oral  Resp:   20 20  Height:      Weight:    108.2 kg (238 lb 8.6 oz)  SpO2:    93% 96%    Intake/Output Summary (Last 24 hours) at 07/09/12 0819 Last data filed at 07/08/12 1329  Gross per 24 hour  Intake      0 ml  Output    200 ml  Net   -200 ml   Filed Weights   07/08/12 1514 07/09/12 0630  Weight: 107.366 kg (236 lb 11.2 oz) 108.2 kg (238 lb 8.6 oz)    Exam:   General:  No distress.  Cardiovascular: S 1, S 2 Distant heart sounds.   Respiratory: Decreases breath sound, bilateral fine crackles.   Abdomen: Bs present, soft, NT  Musculoskeletal: bilateral LE erythema, edema.   Data Reviewed: Basic Metabolic Panel:  Recent Labs Lab 07/08/12 1056 07/08/12 2205 07/09/12 0517  NA 135  --  135  K 2.9*  --  3.3*  CL 94*  --  99  CO2 25  --  25  GLUCOSE 114*  --  113*  BUN 7  --  8  CREATININE 1.12  --  1.19  CALCIUM 9.3  --  8.4  MG  --  1.6  --    Liver Function Tests:  Recent Labs Lab 07/08/12 1056 07/09/12 0517  AST 71* 57*  ALT 30 23  ALKPHOS 139* 115  BILITOT 3.2* 2.7*  PROT 7.3 6.1  ALBUMIN 3.6 2.9*   No results found for this basename: LIPASE, AMYLASE,  in the last 168 hours No results found for this  basename: AMMONIA,  in the last 168 hours CBC:  Recent Labs Lab 07/08/12 1056 07/09/12 0517  WBC 7.1 5.9  HGB 17.1* 15.3  HCT 51.2 47.2  MCV 81.5 81.7  PLT 143* 135*   Cardiac Enzymes:  Recent Labs Lab 07/08/12 1056  TROPONINI <0.30   BNP (last 3 results)  Recent Labs  05/15/12 2229 07/08/12 1056  PROBNP 163.6 232.4   CBG: No results found for this basename: GLUCAP,  in the last 168 hours  No results found for this or any previous visit (from the past 240 hour(s)).   Studies: Dg Chest 2 View  07/08/2012   *RADIOLOGY REPORT*  Clinical Data: Cough and body aches  CHEST - 2 VIEW  Comparison: 05/15/2012  Findings: Mild cardiomegaly.  Low volumes.  Vascular congestion. Interstitial edema has not significantly changed.  Elevation of the right hemidiaphragm is noted.  No pneumothorax. Lower thoracic vertebral  plana deformity is again noted.  IMPRESSION: Stable vascular congestion and interstitial edema.   Original Report Authenticated By: Jolaine Click, M.D.    Scheduled Meds: . aspirin EC  81 mg Oral Daily  . carvedilol  6.25 mg Oral BID WC  . levothyroxine  125 mcg Oral q morning - 10a  . magnesium sulfate 1 - 4 g bolus IVPB  2 g Intravenous Once  . multivitamin with minerals  1 tablet Oral Daily  . sodium chloride  3 mL Intravenous Q12H   Continuous Infusions: . sodium chloride 20 mL/hr at 07/08/12 1131    Principal Problem:   Shortness of breath Active Problems:   Essential hypertension   Chronic diastolic heart failure   Unspecified hypothyroidism    Time spent: 35 minutes.     Scott Mooney  [facility] Pager [phone]. If 7PM-7AM, please contact night-coverage at [email or web address], password [facility] 07/09/2012, 8:19 AM  LOS: 1 day

## 2012-07-09 NOTE — Progress Notes (Signed)
HF brochure given to pt and family( at bedside). Teaching done. All questions answered. Family verbalized understanding of teaching. Vwilliams,rn.

## 2012-07-09 NOTE — Progress Notes (Signed)
Pt attempted to void x 2 today; but unsuccessful. Attempted to bladder scan pt; but faulty result of 40 ml given only to find out was due to low battery. Unable to replace battery at this time. Pt just received IV lasix. MD notified of situation and asked if foley is a possibility. Order given for foley. Foley placed. 325 ml of dark tea-colored urine noted. Pt tolerated procedure. Vwilliams,rn.

## 2012-07-10 LAB — CBC
MCH: 27.4 pg (ref 26.0–34.0)
MCV: 82 fL (ref 78.0–100.0)
Platelets: 110 10*3/uL — ABNORMAL LOW (ref 150–400)
RBC: 6.05 MIL/uL — ABNORMAL HIGH (ref 4.22–5.81)

## 2012-07-10 LAB — COMPREHENSIVE METABOLIC PANEL
AST: 60 U/L — ABNORMAL HIGH (ref 0–37)
CO2: 22 mEq/L (ref 19–32)
Calcium: 8.7 mg/dL (ref 8.4–10.5)
Creatinine, Ser: 1.27 mg/dL (ref 0.50–1.35)
GFR calc Af Amer: 59 mL/min — ABNORMAL LOW (ref >90)
GFR calc non Af Amer: 51 mL/min — ABNORMAL LOW (ref >90)
Glucose, Bld: 121 mg/dL — ABNORMAL HIGH (ref 70–99)

## 2012-07-10 LAB — BLOOD GAS, ARTERIAL
Drawn by: 257701
O2 Content: 2 L/min
O2 Saturation: 95.9 %
Patient temperature: 37

## 2012-07-10 LAB — GLUCOSE, CAPILLARY
Glucose-Capillary: 115 mg/dL — ABNORMAL HIGH (ref 70–99)
Glucose-Capillary: 118 mg/dL — ABNORMAL HIGH (ref 70–99)

## 2012-07-10 MED ORDER — HYDROCORTISONE 10 MG PO TABS
10.0000 mg | ORAL_TABLET | Freq: Every day | ORAL | Status: DC
  Administered 2012-07-10 – 2012-07-12 (×3): 10 mg via ORAL
  Filled 2012-07-10 (×4): qty 1

## 2012-07-10 MED ORDER — ALBUTEROL SULFATE (5 MG/ML) 0.5% IN NEBU
2.5000 mg | INHALATION_SOLUTION | Freq: Four times a day (QID) | RESPIRATORY_TRACT | Status: DC
  Administered 2012-07-10 – 2012-07-12 (×9): 2.5 mg via RESPIRATORY_TRACT
  Filled 2012-07-10 (×10): qty 0.5

## 2012-07-10 MED ORDER — HYDROCORTISONE 5 MG PO TABS
25.0000 mg | ORAL_TABLET | Freq: Every morning | ORAL | Status: DC
  Administered 2012-07-10 – 2012-07-16 (×7): 25 mg via ORAL
  Filled 2012-07-10 (×7): qty 1

## 2012-07-10 MED ORDER — LACTULOSE 10 GM/15ML PO SOLN
30.0000 g | Freq: Two times a day (BID) | ORAL | Status: DC
  Administered 2012-07-10 – 2012-07-11 (×3): 30 g via ORAL
  Filled 2012-07-10 (×4): qty 45

## 2012-07-10 NOTE — Plan of Care (Signed)
Problem: Consults Goal: Heart Failure Patient Education (See Patient Education module for education specifics.)  Outcome: Progressing Pt education pkt given & enc family to watch videos also

## 2012-07-10 NOTE — Progress Notes (Signed)
07/09/12-Noted on Tele strip that QTC was.60, called T. Callahan,NP, EKG done & report called back to him at 23:00. No new orders at this time. The strip & EKG is stapled togeter in the pt's paper chart in [facility]. Will con't to monitor.

## 2012-07-10 NOTE — Progress Notes (Signed)
[facility] PROGRESS NOTE  NIRAV SWEDA OZH:[id number] DOB: 07-14-1929 DOA: 07/08/2012 PCP: Garlan Fillers, MD  Assessment/Plan:  Acute on chronic  diastolic heart failure exacerbation:  -Patient presents with SOB likely due to vascular congestion, diastolic CHF  -Last 2 D ECHO on file in 02/2012 with normal EF  -BNP on this admission was 232  -Patient recieved lasix 60 mg IV x once in ED -Will hold Imdur and Norvasc due to soft, SBP.  -Appreciate cardio help.  -Continue with Lasix 40 mg BID.   Hypothyroidism  -continue synthroid  -Free T 3 and Free T 4 normal.  -TSH low but in setting of hypopituitarism.   Essential hypertension  -Will Hold Norvasc, Imdur due to soft SBP.  Lower extremities cellulitis: redness B/L LE: Continue with  doxy to cover for infection.  Transaminases:  increase bilirubin: Could be secondary to hepatic congestion. RUQ [place]; Prominent hepatic steatosis. Small simple appearing hepatic and renal cysts. Bilirubin decrease to 2.7 from 3.2.  Asymptomatic VT: Replete Potasium, and Mg.  Chronic Thrombocytopenia; Monitor.  Hypotension: Hold Norvasc, Imdur. Treat cellulitis. lactic acid at 1.9. No IV fluids due to heart Failure. Resume Hydrocortisone. I discussed case with PCP. Patient was suppose to be taking Hydrocortisone.  Encephalopathy: Will order ABG,. Glucose 118. Check ammonia level. Patient didn't sleep last night. Will monitor closely.    Code Status: Full Code.  Family Communication: Care discussed with patient, multiple family at bedside.  Disposition Plan: Remain inpatient.    Consultants:  [facility].   Procedures:  None  Antibiotics:  Doxycycline 6-29.   HPI/Subjective: Patient sleepy today. Didn't sleep last night.  Open eyes to command, fall back to sleep.   Objective: Filed Vitals:   07/09/12 1303 07/09/12 2148 07/10/12 0349 07/10/12 0943  BP: 116/6 128/52 99/44   Pulse:  78 88   Temp: 97.5 F (36.4 C) 98.8  F (37.1 C) 99.1 F (37.3 C)   TempSrc: Oral Oral Oral   Resp: 20 20 20    Height:      Weight:   107.004 kg (235 lb 14.4 oz)   SpO2: 98% 95% 90% 94%    Intake/Output Summary (Last 24 hours) at 07/10/12 1009 Last data filed at 07/10/12 0600  Gross per 24 hour  Intake    183 ml  Output    550 ml  Net   -367 ml   Filed Weights   07/08/12 1514 07/09/12 0630 07/10/12 0349  Weight: 107.366 kg (236 lb 11.2 oz) 108.2 kg (238 lb 8.6 oz) 107.004 kg (235 lb 14.4 oz)    Exam:   General:  No distress.  Cardiovascular: S 1, S 2 Distant heart sounds.   Respiratory: Decreases breath sound, bilateral fine crackles.   Abdomen: Bs present, soft, NT  Musculoskeletal: bilateral LE erythema, edema.   Data Reviewed: Basic Metabolic Panel:  Recent Labs Lab 07/08/12 1056 07/08/12 2205 07/09/12 0517 07/10/12 0500  NA 135  --  135 132*  K 2.9*  --  3.3* 5.1  CL 94*  --  99 99  CO2 25  --  25 22  GLUCOSE 114*  --  113* 121*  BUN 7  --  8 10  CREATININE 1.12  --  1.19 1.27  CALCIUM 9.3  --  8.4 8.7  MG  --  1.6  --   --    Liver Function Tests:  Recent Labs Lab 07/08/12 1056 07/09/12 0517 07/10/12 0500  AST 71* 57* 60*  ALT  30 23 22   ALKPHOS 139* 115 117  BILITOT 3.2* 2.7* 2.6*  PROT 7.3 6.1 6.3  ALBUMIN 3.6 2.9* 2.9*   No results found for this basename: LIPASE, AMYLASE,  in the last 168 hours No results found for this basename: AMMONIA,  in the last 168 hours CBC:  Recent Labs Lab 07/08/12 1056 07/09/12 0517 07/10/12 0500  WBC 7.1 5.9 7.8  HGB 17.1* 15.3 16.6  HCT 51.2 47.2 49.6  MCV 81.5 81.7 82.0  PLT 143* 135* 110*   Cardiac Enzymes:  Recent Labs Lab 07/08/12 1056  TROPONINI <0.30   BNP (last 3 results)  Recent Labs  05/15/12 2229 07/08/12 1056  PROBNP 163.6 232.4   CBG:  Recent Labs Lab 07/09/12 0745 07/10/12 0733 07/10/12 0956  GLUCAP 121* 115* 118*    No results found for this or any previous visit (from the past 240 hour(s)).    Studies: Dg Chest 2 View  07/08/2012   *RADIOLOGY REPORT*  Clinical Data: Cough and body aches  CHEST - 2 VIEW  Comparison: 05/15/2012  Findings: Mild cardiomegaly.  Low volumes.  Vascular congestion. Interstitial edema has not significantly changed.  Elevation of the right hemidiaphragm is noted.  No pneumothorax. Lower thoracic vertebral plana deformity is again noted.  IMPRESSION: Stable vascular congestion and interstitial edema.   Original Report Authenticated By: Jolaine Click, M.D.   US Abdomen Complete  07/09/2012   *RADIOLOGY REPORT*  Clinical Data:  Elevated liver transaminases and bilirubin.  COMPLETE ABDOMINAL ULTRASOUND  Comparison:  02/14/2009; 02/05/2009  Findings:  Gallbladder:  Surgically absent.  Common bile duct:  Measures 6 mm in diameter, within normal limits.  Liver:  Echogenic with prominent sonic attenuation favoring diffuse hepatic steatosis.  Small cyst in the hepatic periphery measures up to 0.8 cm in long axis.  IVC:  Poorly seen due to overlying bowel gas.  Pancreas:  Not visualized due to overlying bowel gas.  Spleen:  Splenomegaly to observe the spleen measuring 16.2 x 7.8 x 14.6 cm yielding a volume of 966 ml.  Right Kidney:  Measures 12.6 cm in length and contains a 2.3 x 1.8 x 2.1 cm cyst.  Renal cortical echogenicity within normal limits.  Left Kidney:  Measures 12.8 cm in length with some areas of cortical thinning.  The renal cysts on the left include a 5.9 x 4 by 3 x 4.6 cm cyst without obvious internal nodularity.  No hydronephrosis or mass observed.  Abdominal aorta:  Not well seen due to overlying bowel gas. Maximum visualized diameter 2.5 cm.  IMPRESSION:  1.  Prominent hepatic steatosis. 2.  Small simple appearing hepatic and renal cysts. 3.  Splenomegaly with splenic volume 966 ml. 4.  Poor visualization of the pancreas, IVC, and abdominal aorta due to overlying bowel gas.   Original Report Authenticated By: Gaylyn Rong, M.D.    Scheduled Meds: . albuterol   2.5 mg Nebulization Q6H  . aspirin EC  81 mg Oral Daily  . doxycycline  100 mg Oral Q12H  . furosemide  40 mg Intravenous BID  . levothyroxine  125 mcg Oral q morning - 10a  . multivitamin with minerals  1 tablet Oral Daily  . sodium chloride  3 mL Intravenous Q12H   Continuous Infusions: . sodium chloride 20 mL/hr at 07/08/12 1131    Principal Problem:   Shortness of breath Active Problems:   Essential hypertension   Chronic diastolic heart failure   Unspecified hypothyroidism    Time spent: 63  minutes.     Racheal Mathurin  [facility] Pager [phone]. If 7PM-7AM, please contact night-coverage at [email or web address], password [email or web address] 07/10/2012, 10:09 AM  LOS: 2 days

## 2012-07-10 NOTE — Evaluation (Signed)
Physical Therapy Evaluation Patient Details Name: Scott Mooney MRN: [id number] DOB: 08-Dec-1929 Today's Date: 07/10/2012 Time: 4098-1191 PT Time Calculation (min): 29 min  PT Assessment / Plan / Recommendation History of Present Illness  79 y.o. male with past medical history of diastolic CHF, hypertension, hypothyroidism who presented to [facility] ED 07/08/2012 with complaints of worsening shortness of breath for past few days prior to this admission associated with non productive cough.    Clinical Impression  Pt presents with decreased strength, balance, endurance and ability to follow commands.  Tolerated OOB and ambulation in hallway, however with +2 assist at this time for safety.  Pt will benefit from skilled PT in acute venue to address deficits.  PT recommends HHPT for follow up with 24/7 care/assist if not willing to go to SNF.     PT Assessment  Patient needs continued PT services    Follow Up Recommendations  Home health PT;SNF;Supervision/Assistance - 24 hour    Does the patient have the potential to tolerate intense rehabilitation      Barriers to Discharge        Equipment Recommendations  Rolling walker with 5" wheels    Recommendations for Other Services     Frequency Min 3X/week    Precautions / Restrictions Precautions Precautions: Fall Precaution Comments: monitor O2 sats, on oxygen during session Restrictions Weight Bearing Restrictions: No   Pertinent Vitals/Pain No pain stated      Mobility  Bed Mobility Bed Mobility: Supine to Sit Supine to Sit: 1: +2 Total assist;With rails;HOB elevated Supine to Sit: Patient Percentage: 50% Details for Bed Mobility Assistance: Assist for BLEs out of bed and for trunk to attain sitting position.  Attempted to provide cues for utilizing handrail, however pt unable to fully follow commands, therefore utilized bed bad to scoot pt to EOB.  Transfers Transfers: Sit to Stand;Stand to Sit Sit to Stand: 1: +2 Total  assist;From bed Sit to Stand: Patient Percentage: 50% Stand to Sit: 1: +2 Total assist;To chair/3-in-1 Stand to Sit: Patient Percentage: 50% Details for Transfer Assistance: Assist to rise, steady (provide forward facilitation as pt tends to lean back), and ensure controlled descent.  Max cues for hand placement and scooting to edge of chair, but again, pt with decreased ability to follow commands during session.  Ambulation/Gait Ambulation/Gait Assistance: 1: +2 Total assist Ambulation/Gait: Patient Percentage: 50% Ambulation Distance (Feet): 60 Feet (x2 reps) Assistive device: Rolling walker Ambulation/Gait Assistance Details: Assist to steady throughout and also to assist with negotiating RW.  Pt with decreased problem solving and unable to negotiate around objects in hallway.  Also max cues for maintaining position inside of RW.  Gait Pattern: Step-through pattern;Decreased stride length;Shuffle;Trunk flexed Gait velocity: decreased General Gait Details: Ambulated on 2L O2 due to O2 sats being 88-89% pregait.  SaO2 during ambulation was 93% and following ambulation was 95%.   Stairs: No Wheelchair Mobility Wheelchair Mobility: No    Exercises     PT Diagnosis: Difficulty walking;Generalized weakness  PT Problem List: Decreased strength;Decreased activity tolerance;Decreased balance;Decreased mobility;Decreased coordination;Decreased cognition;Decreased knowledge of use of DME;Decreased safety awareness;Decreased knowledge of precautions PT Treatment Interventions: DME instruction;Gait training;Functional mobility training;Therapeutic activities;Therapeutic exercise;Balance training;Patient/family education     PT Goals(Current goals can be found in the care plan section) Acute Rehab PT Goals Patient Stated Goal: wife and family want to take pt home PT Goal Formulation: With patient/family Time For Goal Achievement: 07/24/12 Potential to Achieve Goals: Fair  Visit Information   Last PT  Received On: 07/10/12 Assistance Needed: +2 History of Present Illness: 79 y.o. male with past medical history of diastolic CHF, hypertension, hypothyroidism who presented to [facility] ED 07/08/2012 with complaints of worsening shortness of breath for past few days prior to this admission associated with non productive cough. No complaints of chest pain, fever or chills. No palpitations. Patient did endorse worsening swelling in lower extremities but no associated pain. No abdominal pain, no nausea or vomiting. No lightheadedness or dizziness or loss of consciousness.         Prior Functioning  Home Living Family/patient expects to be discharged to:: Private residence Living Arrangements: Spouse/significant other Available Help at Discharge: Family Type of Home: House Home Layout: One level Prior Function Level of Independence: Needs assistance Gait / Transfers Assistance Needed: Pt was able to get himself to bathroom and bedroom on his own PTA.  ADL's / Homemaking Assistance Needed: Pt needed A with bathing and dressing  Comments: Pt family states that he has gone "downhill" since May and requires more assist for all ADL's.  Communication Communication: No difficulties    Cognition  Cognition Arousal/Alertness: Awake/alert Behavior During Therapy: WFL for tasks assessed/performed Overall Cognitive Status: Impaired/Different from baseline Area of Impairment: Problem solving;Memory;Safety/judgement;Awareness;Following commands Following Commands: Follows one step commands inconsistently Safety/Judgement: Decreased awareness of safety;Decreased awareness of deficits Problem Solving: Slow processing;Decreased initiation;Requires tactile cues;Requires verbal cues    Extremity/Trunk Assessment Upper Extremity Assessment Upper Extremity Assessment: Overall WFL for tasks assessed Lower Extremity Assessment Lower Extremity Assessment: Overall WFL for tasks assessed Cervical / Trunk  Assessment Cervical / Trunk Assessment: Kyphotic   Balance    End of Session PT - End of Session Equipment Utilized During Treatment: Oxygen;Gait belt Activity Tolerance: Patient limited by fatigue Patient left: in chair;with call bell/phone within reach;with family/visitor present Nurse Communication: Mobility status  GP     Vista Deck 07/10/2012, 11:25 AM

## 2012-07-10 NOTE — Evaluation (Signed)
Occupational Therapy Evaluation Patient Details Name: KALYN DIMATTIA MRN: [id number] DOB: [birth year] Today's Date: 07/10/2012 Time: 1027-2536 OT Time Calculation (min): 33 min  OT Assessment / Plan / Recommendation History of present illness 79 y.o. male with past medical history of diastolic CHF, hypertension, hypothyroidism who presented to [facility] ED 07/08/2012 with complaints of worsening shortness of breath for past few days prior to this admission associated with non productive cough. No complaints of chest pain, fever or chills. No palpitations. Patient did endorse worsening swelling in lower extremities but no associated pain. No abdominal pain, no nausea or vomiting. No lightheadedness or dizziness or loss of consciousness.     Clinical Impression   Pt presents to OT with decreased I with ADL activity. Pt will benefit from skilled OT to increase I with ADL activity. Therapist has conversation with family regarding 24/7 care at home would be needed. Family states there will be 24/7 A for pt.  OT Assessment  Patient needs continued OT Services    Follow Up Recommendations  Home health OT;Supervision/Assistance - 24 hour       Equipment Recommendations  3 in 1 bedside comode       Frequency  Min 2X/week    Precautions / Restrictions Precautions Precautions: Fall Precaution Comments: oxygen Restrictions Weight Bearing Restrictions: No       ADL  Grooming: Simulated;Minimal assistance Where Assessed - Grooming: Supported sitting Upper Body Dressing: Performed;Minimal assistance Where Assessed - Upper Body Dressing: Supported sit to stand Lower Body Dressing: +2 Total assistance Lower Body Dressing: Patient Percentage: 50% Where Assessed - Lower Body Dressing: Supported sit to stand Toilet Transfer: Chief of Staff: Patient Percentage: 50% Statistician Method: Sit to stand    OT Diagnosis: Generalized weakness  OT Problem List: Decreased  strength;Decreased activity tolerance;Decreased safety awareness;Decreased cognition OT Treatment Interventions: Self-care/ADL training;Patient/family education;DME and/or AE instruction   OT Goals(Current goals can be found in the care plan section) Acute Rehab OT Goals Patient Stated Goal: wife wants to take pt home OT Goal Formulation: With patient Time For Goal Achievement: 07/17/12  Visit Information  Last OT Received On: 07/10/12 Assistance Needed: +2 History of Present Illness: 79 y.o. male with past medical history of diastolic CHF, hypertension, hypothyroidism who presented to [facility] ED 07/08/2012 with complaints of worsening shortness of breath for past few days prior to this admission associated with non productive cough. No complaints of chest pain, fever or chills. No palpitations. Patient did endorse worsening swelling in lower extremities but no associated pain. No abdominal pain, no nausea or vomiting. No lightheadedness or dizziness or loss of consciousness.         Prior Functioning     Home Living Family/patient expects to be discharged to:: Private residence Living Arrangements: Spouse/significant other Available Help at Discharge: Family Type of Home: House Home Layout: One level Prior Function Level of Independence: Needs assistance ADL's / Homemaking Assistance Needed: Pt needed A with bathing and dressing  Communication Communication: No difficulties         Vision/Perception Vision - History Baseline Vision: Other (comment) (limited sight per nephew) Patient Visual Report: No change from baseline   Cognition  Cognition Arousal/Alertness: Awake/alert Behavior During Therapy: WFL for tasks assessed/performed Overall Cognitive Status: Impaired/Different from baseline Area of Impairment: Problem solving    Extremity/Trunk Assessment Upper Extremity Assessment Upper Extremity Assessment: Overall WFL for tasks assessed     Mobility Bed Mobility Bed  Mobility: Supine to Sit Supine to Sit: 1: +  2 Total assist;With rails;HOB elevated Supine to Sit: Patient Percentage: 50% Transfers Transfers: Sit to Stand;Stand to Sit Sit to Stand: 1: +2 Total assist;From bed Sit to Stand: Patient Percentage: 50% Stand to Sit: 1: +2 Total assist;To chair/3-in-1 Stand to Sit: Patient Percentage: 50%           End of Session OT - End of Session Activity Tolerance: Patient tolerated treatment well Patient left: in chair;with family/visitor present;with call bell/phone within reach Nurse Communication: Mobility status  GO     Alba Cory 07/10/2012, 11:05 AM

## 2012-07-10 NOTE — Progress Notes (Addendum)
Subjective:  Patient is lying flat comfortably this morning.  Denies chest pain.  Mildly dyspneic with movement in bed.  Objective:  Vital Signs in the last 24 hours: Temp:  [97.5 F (36.4 C)-99.1 F (37.3 C)] 99.1 F (37.3 C) (06/30 0349) Pulse Rate:  [78-88] 88 (06/30 0349) Resp:  [20] 20 (06/30 0349) BP: (99-128)/(6-52) 99/44 mmHg (06/30 0349) SpO2:  [90 %-98 %] 90 % (06/30 0349) Weight:  [235 lb 14.4 oz (107.004 kg)] 235 lb 14.4 oz (107.004 kg) (06/30 0349)  Intake/Output from previous day: 06/29 0701 - 06/30 0700 In: 183 [P.O.:180; I.V.:3] Out: 550 [Urine:550] Intake/Output from this shift:    . aspirin EC  81 mg Oral Daily  . doxycycline  100 mg Oral Q12H  . furosemide  40 mg Intravenous BID  . levothyroxine  125 mcg Oral q morning - 10a  . multivitamin with minerals  1 tablet Oral Daily  . sodium chloride  3 mL Intravenous Q12H   . sodium chloride 20 mL/hr at 07/08/12 1131    Physical Exam: The patient appears to be in no distress.  Not dyspneic on room air at rest.  Head and neck exam reveals that the pupils are equal and reactive.  The extraocular movements are full.  There is no scleral icterus.  Mouth and pharynx are benign.  No lymphadenopathy.  No carotid bruits.  The jugular venous pressure is normal.  Thyroid is not enlarged or tender.  Chest reveals inspiratory rales at bases  Heart reveals no abnormal lift or heave.  First and second heart sounds are normal.  There is no murmur gallop rub or click.  The abdomen is soft and nontender.  Bowel sounds are normoactive.  There is no hepatosplenomegaly or mass.  There are no abdominal bruits.  Extremities reveal stasis dermatitis and 2+ pedal edema. Pedal pulses are good.  There is no cyanosis or clubbing.  Neurologic exam is normal strength and no lateralizing weakness.  No sensory deficits.  Integument reveals no rash.  Lab Results:  Recent Labs  07/09/12 0517 07/10/12 0500  WBC 5.9 7.8  HGB  15.3 16.6  PLT 135* 110*    Recent Labs  07/09/12 0517 07/10/12 0500  NA 135 132*  K 3.3* 5.1  CL 99 99  CO2 25 22  GLUCOSE 113* 121*  BUN 8 10  CREATININE 1.19 1.27    Recent Labs  07/08/12 1056  TROPONINI <0.30   Hepatic Function Panel  Recent Labs  07/10/12 0500  PROT 6.3  ALBUMIN 2.9*  AST 60*  ALT 22  ALKPHOS 117  BILITOT 2.6*   No results found for this basename: CHOL,  in the last 72 hours No results found for this basename: PROTIME,  in the last 72 hours  Imaging: Imaging results have been reviewed  Cardiac Studies: Telemetry shows normal sinus rhythm Assessment/Plan:  1. Acute on chronic diastolic CHF 3. Normal LVF  4. HTN with borderline low BP  5. Hypokalemia  6. Dietary noncompliance 7. history of pan hypopituitarism  Plan: Will continue IV Lasix another 24 hour then switch to oral Lasix.  If blood pressure improves we can consider trial of Ranexa. Note that patient in the past has been on long-term hydrocortisone for adrenal insufficiency related to his hypopituitarism.  He has been followed for this by Dr. Jarome Matin.  Presently he is not on any oral steroid replacement.  Will defer management of this to primary service   LOS: 2 days  Cassell Clement 07/10/2012, 8:48 AM

## 2012-07-10 NOTE — Plan of Care (Signed)
Problem: Consults Goal: Heart Failure Patient Education (See Patient Education module for education specifics.)  Outcome: Progressing pkt given to family; enc video channel for more pt education

## 2012-07-11 ENCOUNTER — Inpatient Hospital Stay (HOSPITAL_COMMUNITY): Payer: Medicare Other

## 2012-07-11 LAB — BASIC METABOLIC PANEL
CO2: 26 mEq/L (ref 19–32)
Chloride: 100 mEq/L (ref 96–112)
Sodium: 135 mEq/L (ref 135–145)

## 2012-07-11 LAB — GLUCOSE, CAPILLARY: Glucose-Capillary: 128 mg/dL — ABNORMAL HIGH (ref 70–99)

## 2012-07-11 LAB — CBC
Platelets: 150 10*3/uL (ref 150–400)
RBC: 5.89 MIL/uL — ABNORMAL HIGH (ref 4.22–5.81)
WBC: 7.2 10*3/uL (ref 4.0–10.5)

## 2012-07-11 LAB — AMMONIA: Ammonia: 41 umol/L (ref 11–60)

## 2012-07-11 MED ORDER — ENSURE COMPLETE PO LIQD
237.0000 mL | Freq: Two times a day (BID) | ORAL | Status: DC
  Administered 2012-07-11 – 2012-07-16 (×10): 237 mL via ORAL

## 2012-07-11 MED ORDER — RANOLAZINE ER 500 MG PO TB12
500.0000 mg | ORAL_TABLET | Freq: Two times a day (BID) | ORAL | Status: DC
  Administered 2012-07-11 – 2012-07-16 (×11): 500 mg via ORAL
  Filled 2012-07-11 (×12): qty 1

## 2012-07-11 MED ORDER — LACTULOSE 10 GM/15ML PO SOLN
30.0000 g | Freq: Every day | ORAL | Status: DC
  Administered 2012-07-12 – 2012-07-13 (×2): 30 g via ORAL
  Filled 2012-07-11 (×2): qty 45

## 2012-07-11 MED ORDER — VITAMINS A & D EX OINT
TOPICAL_OINTMENT | CUTANEOUS | Status: AC
  Administered 2012-07-11: 20:00:00
  Filled 2012-07-11: qty 5

## 2012-07-11 MED ORDER — FUROSEMIDE 40 MG PO TABS
40.0000 mg | ORAL_TABLET | Freq: Two times a day (BID) | ORAL | Status: DC
  Administered 2012-07-11 (×2): 40 mg via ORAL
  Filled 2012-07-11 (×5): qty 1

## 2012-07-11 NOTE — Progress Notes (Signed)
Subjective:  Denies chest pain. O2 sat 93 % on nasal O2. More confused this am but knows he is in "[facility]".  Objective:  Vital Signs in the last 24 hours: Temp:  [97.5 F (36.4 C)-98.8 F (37.1 C)] 97.5 F (36.4 C) (07/01 0645) Pulse Rate:  [81-84] 81 (07/01 0645) Resp:  [18-22] 22 (07/01 0645) BP: (115-127)/(38-58) 127/58 mmHg (07/01 0645) SpO2:  [92 %-98 %] 93 % (07/01 0645) Weight:  [238 lb 1.6 oz (108 kg)] 238 lb 1.6 oz (108 kg) (07/01 0645)  Intake/Output from previous day: 06/30 0701 - 07/01 0700 In: 50 [P.O.:50] Out: 725 [Urine:725] Intake/Output from this shift:    . albuterol  2.5 mg Nebulization Q6H  . aspirin EC  81 mg Oral Daily  . doxycycline  100 mg Oral Q12H  . furosemide  40 mg Intravenous BID  . hydrocortisone  10 mg Oral q1800  . hydrocortisone  25 mg Oral q morning - 10a  . lactulose  30 g Oral BID  . levothyroxine  125 mcg Oral q morning - 10a  . multivitamin with minerals  1 tablet Oral Daily  . sodium chloride  3 mL Intravenous Q12H   . sodium chloride 20 mL/hr at 07/08/12 1131    Physical Exam: The patient appears to be in no distress.  Not dyspneic on room air at rest.  Head and neck exam reveals that the pupils are equal and reactive.  The extraocular movements are full.  There is no scleral icterus.  Mouth and pharynx are benign.  No lymphadenopathy.  No carotid bruits.  The jugular venous pressure is normal.  Thyroid is not enlarged or tender.  Chest reveals inspiratory rales at bases  Heart reveals no abnormal lift or heave.  First and second heart sounds are normal.  There is no murmur gallop rub or click.  The abdomen is soft and nontender.  Bowel sounds are normoactive.  There is no hepatosplenomegaly or mass.  There are no abdominal bruits.  Extremities reveal stasis dermatitis and trace pedal edema. Pedal pulses are good.  There is no cyanosis or clubbing.  Neurologic exam is normal strength and no lateralizing weakness.  No sensory  deficits.  Integument reveals no rash.  Lab Results:  Recent Labs  07/10/12 0500 07/11/12 0525  WBC 7.8 7.2  HGB 16.6 15.6  PLT 110* 150    Recent Labs  07/10/12 0500 07/11/12 0525  NA 132* 135  K 5.1 3.9  CL 99 100  CO2 22 26  GLUCOSE 121* 128*  BUN 10 15  CREATININE 1.27 1.30    Recent Labs  07/08/12 1056  TROPONINI <0.30   Hepatic Function Panel  Recent Labs  07/10/12 0500  PROT 6.3  ALBUMIN 2.9*  AST 60*  ALT 22  ALKPHOS 117  BILITOT 2.6*   No results found for this basename: CHOL,  in the last 72 hours No results found for this basename: PROTIME,  in the last 72 hours  Imaging: Imaging results have been reviewed  Cardiac Studies: Telemetry shows normal sinus rhythm Assessment/Plan:  1. Acute on chronic diastolic CHF 3. Normal LVF  4. HTN with borderline low BP improved. 5. Hypokalemia improved. 6. Dietary noncompliance 7. history of pan hypopituitarism  Plan: His weight is up but his pedal edema has improved.  BP has improved and hyponatremia has improved since restarting his chronic steroids. Will check chest xray today and switch to oral lasix. Will try adding Ranexa for his diastolic  dysfunction.   LOS: 3 days    Cassell Clement 07/11/2012, 7:39 AM

## 2012-07-11 NOTE — Clinical Documentation Improvement (Signed)
THIS DOCUMENT IS NOT A PERMANENT PART OF THE MEDICAL RECORD  Please update your documentation within the medical record to reflect your response to this query. If you need help knowing how to do this please call [phone].  07/11/12  Dear Dr. Sunnie Nielsen, Scott Schwartz Marton Mooney  In a better effort to capture your patient's severity of illness, reflect appropriate length of stay and utilization of resources, a review of the medical record has revealed the following indicators.    Based on your clinical judgment, please clarify and document in a progress note and/or discharge summary the clinical condition associated with the following supporting information:  In responding to this query please exercise your independent judgment.  The fact that a query is asked, does not imply that any particular answer is desired or expected.  Abnormal findings (laboratory, x-ray, pathologic, and other diagnostic results) are not coded and reported unless the physician indicates their clinical significance.   The medical record reflects the following clinical findings, please clarify the diagnostic and/or clinical significance:      Pt with Acute on Chronic Diastolic CHF. Pt also with conductions disturbances, such as RBBB and non-specific ST/T changes per ED note 07/08/12 EKG 62914 with 1st degree heart block, Inferior Infarct, and  Lateral ischemia  Clarification Needed    Please clarify if Acute on Chronic Diastolic CHF in setting of abnormal EKG can qualify for a more acute diagnosis and document in pn or d/c summary   Possible Clinical Conditions?                                    AMI  NSTEMI  NQWSTEMI      Other Condition___________________                : Cannot Clinically Determine_________    Supporting Information:  Acute on Chronic Diastolic CHF,  Encephalopathy  Thrombocytopenia  Hyponaterrmia  Cellulitis  Renal mass  Hypothyroidism  HTN,  Interstitial edema        Treatment Daily weights Cardiac monitoring  carvedilol (COREG) tablet 6.25 mg    amLODipine (NORVASC) tablet 5 mg furosemide (LASIX) tablet 40 mg aspirin EC tablet 81 mg     Reviewed:  no additional documentation provided waiting on RQ ljh   Thank You,  Enis Slipper  RN, BSN, MSN/Inf, CCDS Clinical Documentation Specialist [facility] Pager: [phone] / E-mail: [email or web address]  [phone] Health Information Management [place]

## 2012-07-11 NOTE — Progress Notes (Signed)
[facility] PROGRESS NOTE  Scott Mooney ZOX:[id number] DOB: 1929-08-03 DOA: 07/08/2012 PCP: Garlan Fillers, MD  Assessment/Plan:  79 y.o. male with past medical history of diastolic CHF, hypertension, hypothyroidism who presented to [facility] ED 07/08/2012 with complaints of worsening shortness of breath for past few days prior to this admission associated with non productive cough. Patient was admitted for treatment of Heart Failure exacerbation. He was treated initially with IV lasix. He was found to be hypotensive probably related to not being taking hydrocortisone. He was notice be very sleepy, somnolent on 6-30 probably related to hospital delirium. Patient is more awake today, following some command. Patient family after discussion agree with DNR status.    1-Acute on chronic  diastolic heart failure exacerbation:  -Patient presents with SOB likely due to vascular congestion, diastolic CHF  -Last 2 D ECHO on file in 02/2012 with normal EF  -BNP on this admission was 232  -Patient recieved lasix 60 mg IV x once in ED -Imdur and Norvasc was discontinue due to soft, SBP.  -Appreciate cardio help.  -He was initially treated with 40 Mg IV lasix BID, Change to oral lasix on 7-1. -Chest x ray ordered on 7-1 for follow up pleural effusion.  -Started on Ranexa  7-1.   Encephalopathy: Patient notice to be somnolent, sleepy on 6-30. He dindt have good night of sleep 6-29.  ABG negative for hypercapnia. Mild increase ammonia level at 80. Started on lactulose. Ammonia decrease to 40.  Probably hospital delirium.   Hypothyroidism  -continue with current synthroid dose.  -Free T 3 and Free T 4 normal.  -TSH low but in setting of hypopituitarism.   Essential hypertension  -Continue to hold  Norvasc. Monitor BP.  -Patient started on Ranexa on 7-1. Monitor BP.   Lower extremities cellulitis:  -Redness B/L LE: Continue with  doxy to cover for infection. Day 3/7.  Transaminases:  increase  bilirubin: Could be secondary to hepatic congestion. RUQ [place]; Prominent hepatic steatosis. Small simple appearing hepatic and renal cysts. Bilirubin decrease to 2.7 from 3.2. Repeat Bilirubin 7-2.   Asymptomatic VT: Replete Potasium, and Mg. Consider resume Coreg when BP allows it.  Chronic Thrombocytopenia; stable.   Hypotension: Hold Norvasc, Imdur. Treat cellulitis. lactic acid at 1.9. No IV fluids due to heart Failure. Resume Hydrocortisone. I discussed case with PCP. Patient was suppose to be taking Hydrocortisone. BP improved on hydrocortisone. He will need Home health nurse to monitor medications at home.     Code Status: Full Code.  Family Communication: Care discussed with patient, multiple family at bedside.  Disposition Plan: Remain inpatient. Family wants to take him home. CM consulted.    Consultants:  [facility].   Procedures:  None  Antibiotics:  Doxycycline 6-29.   HPI/Subjective: Patient more alert, awake, following some command. Denies dyspnea. Ate some breakfast.    Objective: Filed Vitals:   07/10/12 2036 07/10/12 2205 07/11/12 0645 07/11/12 0801  BP:  126/53 127/58   Pulse:  84 81   Temp:  98.3 F (36.8 C) 97.5 F (36.4 C)   TempSrc:  Oral Oral   Resp:  22 22   Height:      Weight:   108 kg (238 lb 1.6 oz)   SpO2: 98% 96% 93% 95%    Intake/Output Summary (Last 24 hours) at 07/11/12 1304 Last data filed at 07/11/12 0647  Gross per 24 hour  Intake     50 ml  Output    725 ml  Net   -675 ml   Filed Weights   07/09/12 0630 07/10/12 0349 07/11/12 0645  Weight: 108.2 kg (238 lb 8.6 oz) 107.004 kg (235 lb 14.4 oz) 108 kg (238 lb 1.6 oz)    Exam:   General:  No distress.  Cardiovascular: S 1, S 2 Distant heart sounds.   Respiratory: Decreases breath sound, bilateral fine crackles.   Abdomen: Bs present, soft, NT  Musculoskeletal: bilateral LE erythema, edema.   Data Reviewed: Basic Metabolic Panel:  Recent Labs Lab  07/08/12 1056 07/08/12 2205 07/09/12 0517 07/10/12 0500 07/11/12 0525  NA 135  --  135 132* 135  K 2.9*  --  3.3* 5.1 3.9  CL 94*  --  99 99 100  CO2 25  --  25 22 26   GLUCOSE 114*  --  113* 121* 128*  BUN 7  --  8 10 15   CREATININE 1.12  --  1.19 1.27 1.30  CALCIUM 9.3  --  8.4 8.7 8.6  MG  --  1.6  --   --   --    Liver Function Tests:  Recent Labs Lab 07/08/12 1056 07/09/12 0517 07/10/12 0500  AST 71* 57* 60*  ALT 30 23 22   ALKPHOS 139* 115 117  BILITOT 3.2* 2.7* 2.6*  PROT 7.3 6.1 6.3  ALBUMIN 3.6 2.9* 2.9*   No results found for this basename: LIPASE, AMYLASE,  in the last 168 hours  Recent Labs Lab 07/10/12 1110 07/11/12 0525  AMMONIA 88* 41   CBC:  Recent Labs Lab 07/08/12 1056 07/09/12 0517 07/10/12 0500 07/11/12 0525  WBC 7.1 5.9 7.8 7.2  HGB 17.1* 15.3 16.6 15.6  HCT 51.2 47.2 49.6 49.3  MCV 81.5 81.7 82.0 83.7  PLT 143* 135* 110* 150   Cardiac Enzymes:  Recent Labs Lab 07/08/12 1056  TROPONINI <0.30   BNP (last 3 results)  Recent Labs  05/15/12 2229 07/08/12 1056  PROBNP 163.6 232.4   CBG:  Recent Labs Lab 07/09/12 0745 07/10/12 0733 07/10/12 0956 07/11/12 0753  GLUCAP 121* 115* 118* 128*    No results found for this or any previous visit (from the past 240 hour(s)).   Studies: Dg Chest 2 View  07/11/2012   *RADIOLOGY REPORT*  Clinical Data:  CHF, cough, congestion, shortness of breath, wheezing  CHEST - 2 VIEW  Comparison: 07/08/2012  Findings: Enlargement of cardiac silhouette with pulmonary vascular congestion. Atherosclerotic calcification aorta. Bibasilar atelectasis, unable to exclude left lower lobe consolidation. Upper lungs grossly clear. No pleural effusion or pneumothorax. Bones demineralized. Low lung volumes noted.  IMPRESSION: Low lung volumes with bibasilar atelectasis. Unable to exclude left lower lobe consolidation. Enlargement of cardiac silhouette with pulmonary vascular congestion.   Original Report  Authenticated By: Ulyses Southward, M.D.   US Abdomen Complete  07/09/2012   *RADIOLOGY REPORT*  Clinical Data:  Elevated liver transaminases and bilirubin.  COMPLETE ABDOMINAL ULTRASOUND  Comparison:  02/14/2009; 02/05/2009  Findings:  Gallbladder:  Surgically absent.  Common bile duct:  Measures 6 mm in diameter, within normal limits.  Liver:  Echogenic with prominent sonic attenuation favoring diffuse hepatic steatosis.  Small cyst in the hepatic periphery measures up to 0.8 cm in long axis.  IVC:  Poorly seen due to overlying bowel gas.  Pancreas:  Not visualized due to overlying bowel gas.  Spleen:  Splenomegaly to observe the spleen measuring 16.2 x 7.8 x 14.6 cm yielding a volume of 966 ml.  Right Kidney:  Measures 12.6 cm  in length and contains a 2.3 x 1.8 x 2.1 cm cyst.  Renal cortical echogenicity within normal limits.  Left Kidney:  Measures 12.8 cm in length with some areas of cortical thinning.  The renal cysts on the left include a 5.9 x 4 by 3 x 4.6 cm cyst without obvious internal nodularity.  No hydronephrosis or mass observed.  Abdominal aorta:  Not well seen due to overlying bowel gas. Maximum visualized diameter 2.5 cm.  IMPRESSION:  1.  Prominent hepatic steatosis. 2.  Small simple appearing hepatic and renal cysts. 3.  Splenomegaly with splenic volume 966 ml. 4.  Poor visualization of the pancreas, IVC, and abdominal aorta due to overlying bowel gas.   Original Report Authenticated By: Gaylyn Rong, M.D.    Scheduled Meds: . albuterol  2.5 mg Nebulization Q6H  . aspirin EC  81 mg Oral Daily  . doxycycline  100 mg Oral Q12H  . furosemide  40 mg Oral BID  . hydrocortisone  10 mg Oral q1800  . hydrocortisone  25 mg Oral q morning - 10a  . lactulose  30 g Oral BID  . levothyroxine  125 mcg Oral q morning - 10a  . multivitamin with minerals  1 tablet Oral Daily  . ranolazine  500 mg Oral BID  . sodium chloride  3 mL Intravenous Q12H   Continuous Infusions: . sodium chloride 20  mL/hr at 07/08/12 1131    Principal Problem:   Shortness of breath Active Problems:   Essential hypertension   Chronic diastolic heart failure   Unspecified hypothyroidism    Time spent: 35 minutes.     REGALADO,BELKYS  [facility] Pager [phone]. If 7PM-7AM, please contact night-coverage at [email or web address], password [facility] 07/11/2012, 1:04 PM  LOS: 3 days

## 2012-07-11 NOTE — Progress Notes (Signed)
[facility]  Patient Status: Active (receiving services up to time of hospitalization)  [facility] is providing the following services: RN  If patient discharges after hours, please call [phone].   Scott Mooney 07/11/2012, 8:48 PM

## 2012-07-11 NOTE — Progress Notes (Signed)
OT Cancellation Note  Patient Details Name: Scott Mooney MRN: [id number] DOB: 08-23-1929   Cancelled Treatment:    Reason Eval/Treat Not Completed: Fatigue/lethargy limiting ability to participate;Other (comment) (pt declined OT )  [name], [place] Kung 07/11/2012, 1:17 PM

## 2012-07-12 DIAGNOSIS — E8809 Other disorders of plasma-protein metabolism, not elsewhere classified: Secondary | ICD-10-CM

## 2012-07-12 LAB — COMPREHENSIVE METABOLIC PANEL
ALT: 17 U/L (ref 0–53)
Albumin: 2.9 g/dL — ABNORMAL LOW (ref 3.5–5.2)
Alkaline Phosphatase: 119 U/L — ABNORMAL HIGH (ref 39–117)
Potassium: 3.7 mEq/L (ref 3.5–5.1)
Sodium: 134 mEq/L — ABNORMAL LOW (ref 135–145)
Total Protein: 6.5 g/dL (ref 6.0–8.3)

## 2012-07-12 LAB — CBC
HCT: 47.2 % (ref 39.0–52.0)
MCHC: 32.2 g/dL (ref 30.0–36.0)
MCV: 83.7 fL (ref 78.0–100.0)
RDW: 20.3 % — ABNORMAL HIGH (ref 11.5–15.5)
WBC: 7.2 10*3/uL (ref 4.0–10.5)

## 2012-07-12 MED ORDER — FUROSEMIDE 80 MG PO TABS
80.0000 mg | ORAL_TABLET | Freq: Two times a day (BID) | ORAL | Status: DC
  Administered 2012-07-12 – 2012-07-16 (×9): 80 mg via ORAL
  Filled 2012-07-12 (×12): qty 1

## 2012-07-12 MED ORDER — QUETIAPINE FUMARATE 50 MG PO TABS
50.0000 mg | ORAL_TABLET | Freq: Every day | ORAL | Status: DC
  Administered 2012-07-12: 50 mg via ORAL
  Filled 2012-07-12 (×2): qty 1

## 2012-07-12 NOTE — Progress Notes (Signed)
Occupational Therapy Treatment Patient Details Name: Scott Mooney MRN: [id number] DOB: 11-27-1929 Today's Date: 07/12/2012 Time: 1478-2956 OT Time Calculation (min): 27 min  OT Assessment / Plan / Recommendation  OT comments  Pt pleasant and cooperative. Pt requires max multimodal cues for safely using RW during functional ADL mobility and demo decreased safety awareness and slow processing. [name]  Follow Up Recommendations  Home health OT;Supervision/Assistance - 24 hour    Barriers to Discharge   None    Equipment Recommendations  3 in 1 bedside comode    Recommendations for Other Services    Frequency Min 2X/week   Progress towards OT Goals Progress towards OT goals: Progressing toward goals  Plan Discharge plan remains appropriate    Precautions / Restrictions Precautions Precautions: Fall Precaution Comments: monitor O2 sats, on oxygen during session/ O2 SATs 98 - 94% on RA before - during/after activity Restrictions Weight Bearing Restrictions: No       ADL  Grooming: Performed;Min guard Where Assessed - Grooming: Supported standing Upper Body Dressing: Performed;Supervision/safety;Set up;Minimal assistance Toilet Transfer: Performed;Moderate assistance Toilet Transfer Method: Sit to stand Toilet Transfer Equipment: Regular height toilet;Grab bars Toileting - Clothing Manipulation and Hygiene: Performed;Maximal assistance Where Assessed - Toileting Clothing Manipulation and Hygiene: Standing Transfers/Ambulation Related to ADLs: max verbal and physiacl cues to initiate mobility, correct hand placement, control of desent and positioning of LEs using RW. Required increased time to complete ADL Comments: [name]    OT Diagnosis:    OT Problem List:   OT Treatment  Interventions:     OT Goals(current goals can now be found in the care plan section) Acute Rehab OT Goals Patient Stated Goal: wife and family want to take pt home  Visit Information  Last OT Received On: 07/12/12 Assistance Needed: +2 Reason Eval/Treat Not Completed: Fatigue/lethargy limiting ability to participate;Other (comment) (eating lunch, many visitors) History of Present Illness: 79 y.o. male with past medical history of diastolic CHF, hypertension, hypothyroidism who presented to [facility] ED 07/08/2012 with complaints of worsening shortness of breath for past few days prior to this admission associated with non productive cough. No complaints of chest pain, fever or chills. No palpitations. Patient did endorse worsening swelling in lower extremities but no associated pain. No abdominal pain, no nausea or vomiting. No lightheadedness or dizziness or loss of consciousness.      Subjective Data      Prior Functioning       Cognition  Cognition Arousal/Alertness: Awake/alert Behavior During Therapy: WFL for tasks assessed/performed Overall Cognitive Status: Impaired/Different from baseline Area of Impairment: Problem solving;Memory;Safety/judgement;Awareness;Following commands Following Commands: Follows one step commands inconsistently Safety/Judgement: Decreased awareness of safety;Decreased awareness of deficits Problem Solving: Slow processing;Decreased initiation;Requires tactile cues;Requires verbal cues    Mobility  Bed Mobility Bed Mobility: Supine to Sit;Sitting - Scoot to Edge of Bed Supine to Sit: 1: +2 Total assist;With rails;HOB elevated Supine to Sit: Patient Percentage: 50% Sitting - Scoot to Edge of Bed: 2: Max assist Details for Bed Mobility Assistance: Assist for BLEs out of bed and for trunk to attain sitting position.  Verbal cues given for initiating supine to sit but pt unable to fully follow commands, therefore utilized bed pad to scoot pt to EOB.   Transfers Transfers: Sit to Stand;Stand to Sit Sit to  Stand: 1: +2 Total assist;From bed;With armrests;With upper extremity assist;From toilet Sit to Stand: Patient Percentage: 60% Stand to Sit: 1: +2 Total assist;To chair/3-in-1;With armrests;With upper extremity assist;To toilet Stand to Sit: Patient Percentage: 80% Details for Transfer Assistance: Assist to rise, steady (provide forward facilitation as pt tends to lean back), and ensure controlled descent.  Max cues for hand placement and scooting to edge of bed, but again, pt with decreased ability to follow commands during session.     Exercises      Balance Balance Balance Assessed: No   End of Session OT - End of Session Equipment Utilized During Treatment: Gait belt;Rolling walker Activity Tolerance: Patient tolerated treatment well Patient left: in chair;with family/visitor present;with call bell/phone within reach  GO     Galen Manila 07/12/2012, 1:49 PM

## 2012-07-12 NOTE — Progress Notes (Signed)
[facility] PROGRESS NOTE  Scott Mooney:[id number] DOB: March 04, 1929 DOA: 07/08/2012 PCP: Garlan Fillers, MD  Assessment/Plan:  1-Acute on chronic  diastolic heart failure exacerbation:  -Patient presents with SOB likely due to pulmonary vascular congestion, due to diastolic CHF exacerbation -Last 2 D ECHO on file in 02/2012 with normal EF  -Imdur and Norvasc was discontinue due to soft BP.  -Appreciate cardio help; per recommendations lasix increased to 80mg  BID  -Started on Ranexa  7-1.  -part of his swelling probably due to hypoalbuminemia; started on ensure.  Encephalopathy due to hospital acquired delirium: -will check B12 -will start seroquel at bedtime  Hypothyroidism  -continue with current synthroid dose.  -Free T 3 and Free T 4 normal.  -TSH low but in setting of hypopituitarism.  -continue hydrocortisone  Essential hypertension  -Continue to hold  Norvasc and imdur. Close BP Monitoring  -Patient started on Ranexa on 7-1 per cardiology rec's.  Lower extremities cellulitis:  -Redness B/L LE: Continue with  doxy to cover for infection. Day 4/7.  Transaminases:  increase bilirubin: Could be secondary to hepatic congestion. RUQ [place]; Prominent hepatic steatosis. Small simple appearing hepatic and renal cysts.  -will monitor  Asymptomatic VT:  -Stable. -will monitor electrolytes -once BP stable will resume b-blocker  Chronic Thrombocytopenia; stable. No signs of bleeding.  Hypotension: improved. -continue hydrocortisone Continue holding imdur and norvasc    Code Status: Full Code.  Family Communication: Care discussed with patient, multiple family at bedside.  Disposition Plan: Remain inpatient. Family wants to take him home. CM consulted.   Consultants:  [facility].   Procedures:  None  Antibiotics:  Doxycycline 6-29.   HPI/Subjective: Patient able to follow simple commands; still confuse and with some difficulties expressing  himself.    Objective: Filed Vitals:   07/12/12 0512 07/12/12 0804 07/12/12 1341 07/12/12 1342  BP: 113/48  103/61   Pulse: 80  84   Temp: 97.8 F (36.6 C)  98.7 F (37.1 C)   TempSrc: Oral  Oral   Resp: 20  20   Height:      Weight: 108.863 kg (240 lb)     SpO2: 99% 95% 93% 94%    Intake/Output Summary (Last 24 hours) at 07/12/12 1709 Last data filed at 07/12/12 1341  Gross per 24 hour  Intake    680 ml  Output    875 ml  Net   -195 ml   Filed Weights   07/10/12 0349 07/11/12 0645 07/12/12 0512  Weight: 107.004 kg (235 lb 14.4 oz) 108 kg (238 lb 1.6 oz) 108.863 kg (240 lb)    Exam:   General:  Afebrile, AAOX1, able to follow simple commands  Cardiovascular: S 1, S 2 Distant heart sounds.   Respiratory: no wheezing, positive fine crackles at bases  Abdomen: Bs present, soft, NT  Musculoskeletal: bilateral LE erythema and edema.   Data Reviewed: Basic Metabolic Panel:  Recent Labs Lab 07/08/12 1056 07/08/12 2205 07/09/12 0517 07/10/12 0500 07/11/12 0525 07/12/12 0515  NA 135  --  135 132* 135 134*  K 2.9*  --  3.3* 5.1 3.9 3.7  CL 94*  --  99 99 100 98  CO2 25  --  25 22 26 26   GLUCOSE 114*  --  113* 121* 128* 122*  BUN 7  --  8 10 15 16   CREATININE 1.12  --  1.19 1.27 1.30 1.20  CALCIUM 9.3  --  8.4 8.7 8.6 8.5  MG  --  1.6  --   --   --   --    Liver Function Tests:  Recent Labs Lab 07/08/12 1056 07/09/12 0517 07/10/12 0500 07/12/12 0515  AST 71* 57* 60* 39*  ALT 30 23 22 17   ALKPHOS 139* 115 117 119*  BILITOT 3.2* 2.7* 2.6* 1.4*  PROT 7.3 6.1 6.3 6.5  ALBUMIN 3.6 2.9* 2.9* 2.9*    Recent Labs Lab 07/10/12 1110 07/11/12 0525  AMMONIA 88* 41   CBC:  Recent Labs Lab 07/08/12 1056 07/09/12 0517 07/10/12 0500 07/11/12 0525 07/12/12 0515  WBC 7.1 5.9 7.8 7.2 7.2  HGB 17.1* 15.3 16.6 15.6 15.2  HCT 51.2 47.2 49.6 49.3 47.2  MCV 81.5 81.7 82.0 83.7 83.7  PLT 143* 135* 110* 150 159   Cardiac Enzymes:  Recent Labs Lab  07/08/12 1056  TROPONINI <0.30   BNP (last 3 results)  Recent Labs  05/15/12 2229 07/08/12 1056  PROBNP 163.6 232.4   CBG:  Recent Labs Lab 07/09/12 0745 07/10/12 0733 07/10/12 0956 07/11/12 0753 07/12/12 0730  GLUCAP 121* 115* 118* 128* 117*    Studies: Dg Chest 2 View  07/11/2012   *RADIOLOGY REPORT*  Clinical Data:  CHF, cough, congestion, shortness of breath, wheezing  CHEST - 2 VIEW  Comparison: 07/08/2012  Findings: Enlargement of cardiac silhouette with pulmonary vascular congestion. Atherosclerotic calcification aorta. Bibasilar atelectasis, unable to exclude left lower lobe consolidation. Upper lungs grossly clear. No pleural effusion or pneumothorax. Bones demineralized. Low lung volumes noted.  IMPRESSION: Low lung volumes with bibasilar atelectasis. Unable to exclude left lower lobe consolidation. Enlargement of cardiac silhouette with pulmonary vascular congestion.   Original Report Authenticated By: Ulyses Southward, M.D.    Scheduled Meds: . albuterol  2.5 mg Nebulization Q6H  . aspirin EC  81 mg Oral Daily  . doxycycline  100 mg Oral Q12H  . feeding supplement  237 mL Oral BID BM  . furosemide  80 mg Oral BID  . hydrocortisone  10 mg Oral q1800  . hydrocortisone  25 mg Oral q morning - 10a  . lactulose  30 g Oral Daily  . levothyroxine  125 mcg Oral q morning - 10a  . multivitamin with minerals  1 tablet Oral Daily  . QUEtiapine  50 mg Oral QHS  . ranolazine  500 mg Oral BID  . sodium chloride  3 mL Intravenous Q12H   Continuous Infusions: . sodium chloride 20 mL/hr at 07/08/12 1131    Principal Problem:   Shortness of breath Active Problems:   Essential hypertension   Chronic diastolic heart failure   Unspecified hypothyroidism    Time spent: 35 minutes.     [facility]  [facility] Pager [phone]. If 7PM-7AM, please contact night-coverage at [email or web address], password [facility] 07/12/2012, 5:09 PM  LOS: 4 days

## 2012-07-12 NOTE — Progress Notes (Signed)
OT Cancellation Note  Patient Details Name: Scott Mooney MRN: [id number] DOB: 08-Oct-1929   Cancelled Treatment:    Reason Eval/Treat Not Completed: Fatigue/lethargy limiting ability to participate;Other (comment) (eating lunch, many visitors). Will reattempt later as time allows   Galen Manila, OT 07/12/2012, 12:55 PM

## 2012-07-12 NOTE — Progress Notes (Signed)
Physical Therapy Treatment Patient Details Name: Scott Mooney MRN: [id number] DOB: [birth year] Today's Date: 07/12/2012 Time: 7829-5621 PT Time Calculation (min): 24 min  PT Assessment / Plan / Recommendation  PT Comments   *Pt ambulated 100' with RW and max verbal cues for safety. Increased processing time, inconsistently follows commands. Max assist for supine to sit. **  Follow Up Recommendations  SNF;Supervision/Assistance - 24 hour;Home health PT     Does the patient have the potential to tolerate intense rehabilitation     Barriers to Discharge        Equipment Recommendations  Rolling walker with 5" wheels    Recommendations for Other Services    Frequency Min 3X/week   Progress towards PT Goals Progress towards PT goals: Progressing toward goals  Plan Current plan remains appropriate    Precautions / Restrictions Precautions Precautions: Fall Precaution Comments: monitor O2 sats, on oxygen during session/ O2 SATs 98 - 94% on RA before - during/after activity Restrictions Weight Bearing Restrictions: No   Pertinent Vitals/Pain **SaO2 94% on RA with activity 0/10 pain, pt pleasantly confused*    Mobility  Bed Mobility Bed Mobility: Supine to Sit;Sitting - Scoot to Edge of Bed Supine to Sit: 1: +2 Total assist;With rails;HOB elevated Supine to Sit: Patient Percentage: 50% Sitting - Scoot to Edge of Bed: 2: Max assist Details for Bed Mobility Assistance: Assist for BLEs out of bed and for trunk to attain sitting position.  Verbal cues given for initiating supine to sit but pt unable to fully follow commands, therefore utilized bed pad to scoot pt to EOB.  Transfers Transfers: Sit to Stand;Stand to Sit Sit to Stand: 1: +2 Total assist;From bed;With armrests;With upper extremity assist;From toilet Sit to Stand: Patient Percentage: 60% Stand to Sit: 1: +2 Total assist;To chair/3-in-1;With armrests;With upper extremity assist;To toilet Stand to Sit: Patient Percentage:  80% Details for Transfer Assistance: Assist to rise, steady (provide forward facilitation as pt tends to lean back), and ensure controlled descent.  Max cues for hand placement and scooting to edge of bed, but again, pt with decreased ability to follow commands during session.  Ambulation/Gait Ambulation/Gait Assistance: 1: +2 Total assist Ambulation/Gait: Patient Percentage: 80% Ambulation Distance (Feet): 100 Feet Assistive device: Rolling walker Gait Pattern: Decreased stride length;Shuffle;Trunk flexed;Step-to pattern;Decreased step length - right;Decreased step length - left Gait velocity: decreased General Gait Details: Frequent VCs to step into frame of RW with limited response from pt. SaO2 94% on RA walking.  Stairs: No Wheelchair Mobility Wheelchair Mobility: No    Exercises     PT Diagnosis:    PT Problem List:   PT Treatment Interventions:     PT Goals (current goals can now be found in the care plan section) Acute Rehab PT Goals Patient Stated Goal: wife and family want to take pt home PT Goal Formulation: With patient/family Time For Goal Achievement: 07/24/12 Potential to Achieve Goals: Fair  Visit Information  Last PT Received On: 07/12/12 Assistance Needed: +2 PT/OT Co-Evaluation/Treatment: Yes History of Present Illness: 79 y.o. male with past medical history of diastolic CHF, hypertension, hypothyroidism who presented to [facility] ED 07/08/2012 with complaints of worsening shortness of breath for past few days prior to this admission associated with non productive cough. No complaints of chest pain, fever or chills. No palpitations. Patient did endorse worsening swelling in lower extremities but no associated pain. No abdominal pain, no nausea or vomiting. No lightheadedness or dizziness or loss of consciousness.  Subjective Data  Patient Stated Goal: wife and family want to take pt home   Cognition  Cognition Arousal/Alertness: Awake/alert Behavior During  Therapy: WFL for tasks assessed/performed Overall Cognitive Status: Impaired/Different from baseline Area of Impairment: Problem solving;Memory;Safety/judgement;Awareness;Following commands Following Commands: Follows one step commands inconsistently Safety/Judgement: Decreased awareness of safety;Decreased awareness of deficits Problem Solving: Slow processing;Decreased initiation;Requires tactile cues;Requires verbal cues    Balance  Balance Balance Assessed: No  End of Session PT - End of Session Equipment Utilized During Treatment: Gait belt Activity Tolerance: Patient limited by fatigue Patient left: in chair;with call bell/phone within reach;with family/visitor present Nurse Communication: Mobility status   GP     Ralene Bathe Kistler 07/12/2012, 1:48 PM [phone]

## 2012-07-12 NOTE — Care Management Note (Signed)
    Page 1 of 1   07/12/2012     1:54:59 PM   CARE MANAGEMENT NOTE 07/12/2012  Patient:  Scott Mooney, Scott Mooney   Account Number:  [id number]  Date Initiated:  07/09/2012  Documentation initiated by:  [facility]  Subjective/Objective Assessment:   79 year old male admitted with CHF.     Action/Plan:   From home with spouse.   Anticipated DC Date:  07/14/2012   Anticipated DC Plan:  HOME W HOME HEALTH SERVICES      DC Planning Services  CM consult      [facility] Choice  Resumption Of Svcs/PTA Provider   Choice offered to / List presented to:  C-3 Spouse           Status of service:  In process, will continue to follow Medicare Important Message given?  NA - LOS <3 / Initial given by admissions (If response is "NO", the following Medicare IM given date fields will be blank) Date Medicare IM given:   Date Additional Medicare IM given:    Discharge Disposition:    Per UR Regulation:  Reviewed for med. necessity/level of care/duration of stay  If discussed at Long Length of Stay Meetings, dates discussed:    Comments:  07/12/12 Scott Bolger RN,BSN NCM [phone] ENCEPHALOPATHY,CHF,LLE CELLULITIS, MEDS ADJUSTED.[facility] ACTIVE W/HHRN.PT/OT-HH,RW,3N1.WILL NEED FINAL HH ORDERS.

## 2012-07-12 NOTE — Progress Notes (Signed)
Subjective:  Patient is in no distress. Denies chest pain or dyspnea. On nasal oxygen. Confused and thinks he is in "[employer]".  Objective:  Vital Signs in the last 24 hours: Temp:  [97.4 F (36.3 C)-97.8 F (36.6 C)] 97.8 F (36.6 C) (07/02 0512) Pulse Rate:  [71-80] 80 (07/02 0512) Resp:  [20-24] 20 (07/02 0512) BP: (110-147)/(48-57) 113/48 mmHg (07/02 0512) SpO2:  [94 %-99 %] 99 % (07/02 0512) Weight:  [240 lb (108.863 kg)] 240 lb (108.863 kg) (07/02 0512)  Intake/Output from previous day: 07/01 0701 - 07/02 0700 In: 360 [P.O.:360] Out: 675 [Urine:675] Intake/Output from this shift:    . albuterol  2.5 mg Nebulization Q6H  . aspirin EC  81 mg Oral Daily  . doxycycline  100 mg Oral Q12H  . feeding supplement  237 mL Oral BID BM  . furosemide  40 mg Oral BID  . hydrocortisone  10 mg Oral q1800  . hydrocortisone  25 mg Oral q morning - 10a  . lactulose  30 g Oral Daily  . levothyroxine  125 mcg Oral q morning - 10a  . multivitamin with minerals  1 tablet Oral Daily  . ranolazine  500 mg Oral BID  . sodium chloride  3 mL Intravenous Q12H   . sodium chloride 20 mL/hr at 07/08/12 1131    Physical Exam: The patient appears to be in no distress.  Not dyspneic on room air at rest.  Head and neck exam reveals that the pupils are equal and reactive.  The extraocular movements are full.  There is no scleral icterus.  Mouth and pharynx are benign.  No lymphadenopathy.  No carotid bruits.  The jugular venous pressure is normal.  Thyroid is not enlarged or tender.  Chest reveals inspiratory rales at bases  Heart reveals no abnormal lift or heave.  First and second heart sounds are normal.  There is no murmur gallop rub or click.  The abdomen is soft and nontender.  Bowel sounds are normoactive.  There is no hepatosplenomegaly or mass.  There are no abdominal bruits.  Extremities reveal stasis dermatitis and he has very thick ankles but no pitting edema today. Pedal  pulses are good.  There is no cyanosis or clubbing.  Neurologic exam is normal strength and no lateralizing weakness.  No sensory deficits.  Integument reveals no rash.  Lab Results:  Recent Labs  07/11/12 0525 07/12/12 0515  WBC 7.2 7.2  HGB 15.6 15.2  PLT 150 159    Recent Labs  07/11/12 0525 07/12/12 0515  NA 135 134*  K 3.9 3.7  CL 100 98  CO2 26 26  GLUCOSE 128* 122*  BUN 15 16  CREATININE 1.30 1.20   No results found for this basename: TROPONINI, CK, MB,  in the last 72 hours Hepatic Function Panel  Recent Labs  07/12/12 0515  PROT 6.5  ALBUMIN 2.9*  AST 39*  ALT 17  ALKPHOS 119*  BILITOT 1.4*   No results found for this basename: CHOL,  in the last 72 hours No results found for this basename: PROTIME,  in the last 72 hours  Imaging: Imaging results have been reviewed.  Chest xray stable.  Cardiac Studies: Telemetry shows normal sinus rhythm with frequent PACs Assessment/Plan:  1. Acute on chronic diastolic CHF 3. Normal LVF  4. HTN with borderline low BP improved. 5. Hypokalemia improved. 6. Dietary noncompliance 7. history of pan hypopituitarism  Plan: His weight is up but his pedal  edema has improved.  BP has improved and hyponatremia has improved since restarting his chronic steroids. Weight up 2 lbs. Will increase oral lasix.  Tolerating Ranexa. Repeat EKG pending.   LOS: 4 days    Cassell Clement 07/12/2012, 7:37 AM

## 2012-07-13 ENCOUNTER — Inpatient Hospital Stay (HOSPITAL_COMMUNITY): Payer: Medicare Other

## 2012-07-13 DIAGNOSIS — R0602 Shortness of breath: Secondary | ICD-10-CM

## 2012-07-13 LAB — BASIC METABOLIC PANEL
BUN: 15 mg/dL (ref 6–23)
Calcium: 8.1 mg/dL — ABNORMAL LOW (ref 8.4–10.5)
Chloride: 102 mEq/L (ref 96–112)
GFR calc non Af Amer: 62 mL/min — ABNORMAL LOW (ref >90)
Glucose, Bld: 113 mg/dL — ABNORMAL HIGH (ref 70–99)
Potassium: 3.4 mEq/L — ABNORMAL LOW (ref 3.5–5.1)

## 2012-07-13 LAB — GLUCOSE, CAPILLARY: Glucose-Capillary: 101 mg/dL — ABNORMAL HIGH (ref 70–99)

## 2012-07-13 MED ORDER — ALBUTEROL SULFATE (5 MG/ML) 0.5% IN NEBU
2.5000 mg | INHALATION_SOLUTION | Freq: Two times a day (BID) | RESPIRATORY_TRACT | Status: DC
  Administered 2012-07-14 – 2012-07-16 (×5): 2.5 mg via RESPIRATORY_TRACT
  Filled 2012-07-13 (×4): qty 0.5

## 2012-07-13 MED ORDER — POTASSIUM CHLORIDE CRYS ER 20 MEQ PO TBCR
40.0000 meq | EXTENDED_RELEASE_TABLET | ORAL | Status: AC
  Administered 2012-07-13 (×2): 40 meq via ORAL
  Filled 2012-07-13 (×2): qty 2

## 2012-07-13 MED ORDER — QUETIAPINE FUMARATE 50 MG PO TABS
75.0000 mg | ORAL_TABLET | Freq: Every day | ORAL | Status: DC
  Filled 2012-07-13: qty 1

## 2012-07-13 MED ORDER — QUETIAPINE FUMARATE 50 MG PO TABS
50.0000 mg | ORAL_TABLET | Freq: Every day | ORAL | Status: DC
  Administered 2012-07-13: 50 mg via ORAL
  Filled 2012-07-13 (×2): qty 1

## 2012-07-13 MED ORDER — LUBRIDERM SERIOUSLY SENSITIVE EX LOTN
TOPICAL_LOTION | Freq: Two times a day (BID) | CUTANEOUS | Status: DC
  Administered 2012-07-13: 23:00:00 via TOPICAL
  Administered 2012-07-14: 1 via TOPICAL
  Administered 2012-07-15 – 2012-07-16 (×3): via TOPICAL
  Filled 2012-07-13: qty 473

## 2012-07-13 NOTE — Progress Notes (Signed)
[facility]   [facility] is providing the following services: RW and [employer]   If patient discharges after hours, please call [phone].   Renard Hamper [phone] 07/13/2012, 11:22 AM

## 2012-07-13 NOTE — Progress Notes (Signed)
Report received from Tess Blaine,RN. No change in assessment, continue current plan of care. Margarine Grosshans Johnson 

## 2012-07-13 NOTE — Progress Notes (Addendum)
[facility] PROGRESS NOTE  Scott Mooney ZOX:[id number] DOB: [birth year] DOA: 07/08/2012 PCP: Garlan Fillers, MD  Assessment/Plan:  1-Acute on chronic  diastolic heart failure exacerbation:  -Patient presents with SOB likely due to pulmonary vascular congestion, due to diastolic CHF exacerbation -Last 2 D ECHO on file in 02/2012 with normal EF  -Imdur and Norvasc was discontinue due to soft BP.  -Appreciate cardio help; per recommendations will continue lasix of 80mg  BID  -Started on Ranexa  7-1.  -part of his swelling probably due to hypoalbuminemia; will continue ensure. -replete electrolytes  Encephalopathy due to hospital acquired delirium: -B12 WNL -will continue seroquel at bedtime -since patient is having some dysarthria as well will check CT head and will ask Speech therapy to evaluate  Hypothyroidism  -continue with current synthroid dose.  -Free T 3 and Free T 4 normal.  -TSH low but in setting of hypopituitarism.  -continue hydrocortisone  Essential hypertension  -Continue to hold  Norvasc and imdur. -Patient started on Ranexa on 7-1 per cardiology rec's. -improved today and SBP with in 120-140 range. Will continue  Close BP Monitoring   Lower extremities cellulitis:  -Redness B/L LE: Continue with  doxy to cover for infection. Day 5/7.  Transaminases:  increase bilirubin: Could be secondary to hepatic congestion. RUQ [place]; Prominent hepatic steatosis. Small simple appearing hepatic and renal cysts.  -will monitor -ammonia now WNL; will discontinue lactulose  Asymptomatic VT:  -Stable. -will monitor electrolytes -will start low dose lopressor in am if SBP remains stable  Chronic Thrombocytopenia; stable. No signs of bleeding.  Hypotension: improved. -will start titrating hydrocortisone down -Continue holding imdur and norvasc   Hypokalemia: will replete. Due to diuretics use    Code Status: Full Code.  Family Communication: Care discussed with  patient, multiple family at bedside.  Disposition Plan: Remain inpatient. Family wants to take him home. CM consulted.   Consultants:  [facility].   Procedures:  None  Antibiotics:  Doxycycline 6-29.   HPI/Subjective: Patient able to follow simple commands; is more alert and oriented today. Per nurse report has a better night as well. Slight dysarthria on exam. Family also concerns of coughing spells with food intake  Objective: Filed Vitals:   07/12/12 2015 07/12/12 2114 07/13/12 0540 07/13/12 1340  BP:  142/58 128/52 126/59  Pulse:  89 77 81  Temp:  98.1 F (36.7 C) 97.8 F (36.6 C) 97.4 F (36.3 C)  TempSrc:  Oral Oral Oral  Resp:  20 20 20   Height:      Weight:   107.9 kg (237 lb 14 oz)   SpO2: 95% 94% 95% 94%    Intake/Output Summary (Last 24 hours) at 07/13/12 1438 Last data filed at 07/13/12 1400  Gross per 24 hour  Intake   1200 ml  Output   2275 ml  Net  -1075 ml   Filed Weights   07/11/12 0645 07/12/12 0512 07/13/12 0540  Weight: 108 kg (238 lb 1.6 oz) 108.863 kg (240 lb) 107.9 kg (237 lb 14 oz)    Exam:   General:  Afebrile, AAOX2, able to follow simple commands and better interaction  Cardiovascular: S 1, S 2 Distant heart sounds; no rubs or gallops  Respiratory: no wheezing, slight fine crackles at bases  Abdomen: Bs present, soft, NT  Musculoskeletal: bilateral LE erythema and edema. improving  Data Reviewed: Basic Metabolic Panel:  Recent Labs Lab 07/08/12 1056 07/08/12 2205 07/09/12 8119 07/10/12 0500 07/11/12 1478 07/12/12 0515 07/13/12 2956  NA 135  --  135 132* 135 134* 138  K 2.9*  --  3.3* 5.1 3.9 3.7 3.4*  CL 94*  --  99 99 100 98 102  CO2 25  --  25 22 26 26 27   GLUCOSE 114*  --  113* 121* 128* 122* 113*  BUN 7  --  8 10 15 16 15   CREATININE 1.12  --  1.19 1.27 1.30 1.20 1.08  CALCIUM 9.3  --  8.4 8.7 8.6 8.5 8.1*  MG  --  1.6  --   --   --   --   --    Liver Function Tests:  Recent Labs Lab  07/08/12 1056 07/09/12 0517 07/10/12 0500 07/12/12 0515  AST 71* 57* 60* 39*  ALT 30 23 22 17   ALKPHOS 139* 115 117 119*  BILITOT 3.2* 2.7* 2.6* 1.4*  PROT 7.3 6.1 6.3 6.5  ALBUMIN 3.6 2.9* 2.9* 2.9*    Recent Labs Lab 07/10/12 1110 07/11/12 0525  AMMONIA 88* 41   CBC:  Recent Labs Lab 07/08/12 1056 07/09/12 0517 07/10/12 0500 07/11/12 0525 07/12/12 0515  WBC 7.1 5.9 7.8 7.2 7.2  HGB 17.1* 15.3 16.6 15.6 15.2  HCT 51.2 47.2 49.6 49.3 47.2  MCV 81.5 81.7 82.0 83.7 83.7  PLT 143* 135* 110* 150 159   Cardiac Enzymes:  Recent Labs Lab 07/08/12 1056  TROPONINI <0.30   BNP (last 3 results)  Recent Labs  05/15/12 2229 07/08/12 1056  PROBNP 163.6 232.4   CBG:  Recent Labs Lab 07/10/12 0733 07/10/12 0956 07/11/12 0753 07/12/12 0730 07/13/12 0731  GLUCAP 115* 118* 128* 117* 101*    Studies: No results found.  Scheduled Meds: . [START ON 07/14/2012] albuterol  2.5 mg Nebulization BID  . aspirin EC  81 mg Oral Daily  . doxycycline  100 mg Oral Q12H  . feeding supplement  237 mL Oral BID BM  . furosemide  80 mg Oral BID  . hydrocortisone  25 mg Oral q morning - 10a  . levothyroxine  125 mcg Oral q morning - 10a  . lubriderm seriously sensitive   Topical BID  . multivitamin with minerals  1 tablet Oral Daily  . QUEtiapine  75 mg Oral QHS  . ranolazine  500 mg Oral BID  . sodium chloride  3 mL Intravenous Q12H   Continuous Infusions: . sodium chloride 20 mL/hr at 07/08/12 1131    Principal Problem:   Shortness of breath Active Problems:   Essential hypertension   Chronic diastolic heart failure   Unspecified hypothyroidism    Time spent: 35 minutes.     [facility]  [facility] Pager [phone]. If 7PM-7AM, please contact night-coverage at [email or web address], password [facility] 07/13/2012, 2:38 PM  LOS: 5 days

## 2012-07-13 NOTE — Progress Notes (Signed)
Speech Language Pathology Dysphagia Treatment Patient Details Name: Scott Mooney MRN: [id number] DOB: [birth year] Today's Date: 07/13/2012 Time: 4098-1191 SLP Time Calculation (min): 32 min  Assessment / Plan / Recommendation Clinical Impression  Pt seen for skilled diagnotic dysphagia treatment to help determine if pt tolerates nectar thickened liquids better than thin and for family education.  SLP observed pt consuming graham crackers, peaches, nectar thick liquid and thin water.  Initially pt tolerated his po well *self feeding* without immediate clinical s/s of aspiration.  After consumption of approximately four crackers, 2 ounces water, 3 ounces nectar, and 6 bites of peaches - pt started overtly coughing consisently with intake.  Advised pt to cease po and allow frequent rest breaks during meals as indicated.  Given pt's hx of known esophageal deficits, suspect pt's multifactorial dysphagia contributes to his aspiration risk.  Pt indicated he felt his swallow was better with nectar than thin, but SLP can not definitvely state so given infrequent cough across liquid consistencies.   Educated spouse and pt to aspiration precautions to mitigate risk and advised pt to consume several small meals/day to decrease fatigue and accommodate multiple factorial dysphagia. Spouse given writen instructions for heimlich maneuver and precautions.      No further SLP indicated as all education has been completed with SLP contact information to use as indicated.  Please reorder if desire.  Thanks for this referral.     Diet Recommendation   Regular;Thin liquid (use thickener if increases comfort for pt with po)    SLP Plan All goals met   Pertinent Vitals/Pain Afebrile, decreased   Swallowing Goals  SLP Swallowing Goals Patient will utilize recommended strategies during swallow to increase swallowing safety with: Minimal cueing Swallow Study Goal #2 - Progress: Met Goal #3: Pt's family will verbalize  precautions to mitigate aspiration risk with min assistance.  Swallow Study Goal #3 - Progress: Met  General Temperature Spikes Noted: No Respiratory Status: Room air Behavior/Cognition: Alert;Cooperative;Pleasant mood Oral Cavity - Dentition: Adequate natural dentition Patient Positioning: Upright in bed  Oral Cavity - Oral Hygiene Does patient have any of the following "at risk" factors?:  (xerostomia) Patient is AT RISK - Oral Care Protocol followed (see row info): Yes   Dysphagia Treatment Treatment focused on: Skilled observation of diet tolerance;Patient/family/caregiver education Treatment Methods/Modalities: Skilled observation;Differential diagnosis Patient observed directly with PO's: Yes Type of PO's observed: Dysphagia 3 (soft);Thin liquids;Nectar-thick liquids Feeding: Able to feed self;Total assist Liquids provided via: Cup;Straw Oral Phase Signs & Symptoms: Prolonged bolus formation;Prolonged oral phase Pharyngeal Phase Signs & Symptoms: Suspected delayed swallow initiation;Delayed cough Type of cueing: Verbal Amount of cueing: Minimal   GO    Donavan Burnet, MS [facility] SLP [phone]

## 2012-07-13 NOTE — Progress Notes (Signed)
Subjective:  Patient is in no distress.Awakened from sound sleep this am.  Denies any symptoms. Objective:  Vital Signs in the last 24 hours: Temp:  [97.8 F (36.6 C)-98.7 F (37.1 C)] 97.8 F (36.6 C) (07/03 0540) Pulse Rate:  [77-89] 77 (07/03 0540) Resp:  [20] 20 (07/03 0540) BP: (103-142)/(52-61) 128/52 mmHg (07/03 0540) SpO2:  [93 %-95 %] 95 % (07/03 0540) Weight:  [237 lb 14 oz (107.9 kg)] 237 lb 14 oz (107.9 kg) (07/03 0540)  Intake/Output from previous day: 07/02 0701 - 07/03 0700 In: 800 [P.O.:800] Out: 2300 [Urine:2300] Intake/Output from this shift:    . [START ON 07/14/2012] albuterol  2.5 mg Nebulization BID  . aspirin EC  81 mg Oral Daily  . doxycycline  100 mg Oral Q12H  . feeding supplement  237 mL Oral BID BM  . furosemide  80 mg Oral BID  . hydrocortisone  10 mg Oral q1800  . hydrocortisone  25 mg Oral q morning - 10a  . lactulose  30 g Oral Daily  . levothyroxine  125 mcg Oral q morning - 10a  . multivitamin with minerals  1 tablet Oral Daily  . QUEtiapine  50 mg Oral QHS  . ranolazine  500 mg Oral BID  . sodium chloride  3 mL Intravenous Q12H   . sodium chloride 20 mL/hr at 07/08/12 1131    Physical Exam: The patient appears to be in no distress.  Not dyspneic on room air at rest.  Head and neck exam reveals that the pupils are equal and reactive.  The extraocular movements are full.  There is no scleral icterus.  Mouth and pharynx are benign.  No lymphadenopathy.  No carotid bruits.  The jugular venous pressure is normal.  Thyroid is not enlarged or tender.  Chest reveals very minimal inspiratory rales at bases  Heart reveals no abnormal lift or heave.  First and second heart sounds are normal.  There is no murmur gallop rub or click.  The abdomen is soft and nontender.  Bowel sounds are normoactive.  There is no hepatosplenomegaly or mass.  There are no abdominal bruits.  Extremities reveal stasis dermatitis and he has very thick ankles but no  pitting edema today. Pedal pulses are good.  There is no cyanosis or clubbing.  Neurologic exam is normal strength and no lateralizing weakness.  No sensory deficits.  Integument reveals no rash.  Lab Results:  Recent Labs  07/11/12 0525 07/12/12 0515  WBC 7.2 7.2  HGB 15.6 15.2  PLT 150 159    Recent Labs  07/12/12 0515 07/13/12 0511  NA 134* 138  K 3.7 3.4*  CL 98 102  CO2 26 27  GLUCOSE 122* 113*  BUN 16 15  CREATININE 1.20 1.08   No results found for this basename: TROPONINI, CK, MB,  in the last 72 hours Hepatic Function Panel  Recent Labs  07/12/12 0515  PROT 6.5  ALBUMIN 2.9*  AST 39*  ALT 17  ALKPHOS 119*  BILITOT 1.4*   No results found for this basename: CHOL,  in the last 72 hours No results found for this basename: PROTIME,  in the last 72 hours  Imaging: Imaging results have been reviewed.  Chest xray stable.  Cardiac Studies: Telemetry shows normal sinus rhythm with frequent PACs Assessment/Plan:  1. Acute on chronic diastolic CHF 3. Normal LVF  4. HTN with borderline low BP improved. 5. Hypokalemia . 6. Dietary noncompliance 7. history of pan hypopituitarism  Plan: Rhythm stable NSR with occ PVCs.  EKG pending. Weight down 3 lb.  Renal function okay on higher dose lasix. Potassium repletion per primary service  Anticipate home soon with [facility]  LOS: 5 days    Cassell Clement 07/13/2012, 7:19 AM

## 2012-07-13 NOTE — Progress Notes (Signed)
Patient responded to [facility] restful night and slept all night. Patient did not talk all night as he did the previous night and did not sleep. He had a much better night and sleep, denies pain, breathing unlabored. SRP, RN

## 2012-07-13 NOTE — Evaluation (Addendum)
Clinical/Bedside Swallow Evaluation Patient Details  Name: FRANCES JOYNT MRN: [id number] Date of Birth: November 27, 1929  Today's Date: 07/13/2012 Time: 1215-1250 SLP Time Calculation (min): 35 min  Past Medical History:  Past Medical History  Diagnosis Date  . Diastolic dysfunction   . History of pituitary tumor   . Pituitary insufficiency   . Hypothyroidism   . GERD (gastroesophageal reflux disease)   . Splenic flexure syndrome   . CHF (congestive heart failure)     with diastolic dysfunction  . Hiatal hernia   . Coronary artery disease   . Cardiomyopathy   . Diverticulitis     recurrent  . Nephrolithiasis     right-sided  . Fatty liver   . Gout   . Arthritis   . Exogenous obesity   . Personal history of colonic polyps 07/23/2008    TUBULAR ADENOMA  . Ureteral stone     distal ureteral stone   . Thrombocytopenia    Past Surgical History:  Past Surgical History  Procedure Laterality Date  . Transphenoidal pituitary resection  2001  . Cholecystectomy, laparoscopic  2004  . Esophageal dilation  12/2006    endoscopy with dilatation of esophageal stricture   HPI:  79 yo male adm to [facility] 07/08/12 with AMS, cough and hypoxia.  Pt found to have encephalopathy, CHF, LLE cellulitis.  PMH + for GERD, gastritis, ? Barrett's esophagus and strictures.  He is s/p EGD for stricture at [facility] 10/05/2010.  Order for swallow evaluation received.  Pt's family reports pt with h/o coughing with liquids more than foods x3 years - not worsening significantly recently.   Family also reports pt slowly progressive speech and voice changes since his birthday this year.    Assessment / Plan / Recommendation Clinical Impression  Pt presents with clinical indications of oropharyngeal dysphagia - mildly delayed oral transiting/mastication noted without oral pocketing.  Coughing noted during meal observation - most notably with liquids.  Family admits to pt coughing with liquids (more than solids) for 3 years  without significant change.  Pt also with poor intake x a few weeks prior to admission.  Pt has not required heimlich manuever but had nearly required it when consuming meat in 2012 (prior to dilatation).  Suspect pt has component of chronic aspiration/dysphagia that he manages.  Provided family with sample packet of thickener to utilize if cough is diminished and pt is more comfortable with intake.  Chin tuck posture tried without success.      Aspiration Risk  Moderate    Diet Recommendation Dysphagia 3 (Mechanical Soft);Nectar-thick liquid;Thin liquid (use thickener if helpful to decr cough and inc comfort)   Liquid Administration via: Cup;Straw Medication Administration: Crushed with puree Supervision: Patient able to self feed;Full supervision/cueing for compensatory strategies Compensations: Slow rate;Small sips/bites Postural Changes and/or Swallow Maneuvers: Seated upright 90 degrees;Upright 30-60 min after meal    Other  Recommendations Oral Care Recommendations: Oral care BID Other Recommendations: Order thickener from pharmacy   Follow Up Recommendations       Frequency and Duration min 1 x/week  1 week   Pertinent Vitals/Pain Afebrile, decreased    SLP Swallow Goals Patient will utilize recommended strategies during swallow to increase swallowing safety with: Minimal cueing Swallow Study Goal #2 - Progress: Not met Goal #3: Pt's family will verbalize precautions to mitigate aspiration risk with min assistance.  Swallow Study Goal #3 - Progress: Not met   Swallow Study Prior Functional Status   see clinical impressions  General Date of Onset: 07/13/12 HPI: 79 yo male adm to [facility] 07/08/12 with AMS, cough and hypoxia.  Pt found to have encephalopathy, CHF, LLE cellulitis.  PMH + for GERD, gastritis, ? Barrett's esophagus and strictures.  He is s/p EGD for stricture at [facility] 10/05/2010.  Order for swallow evaluation received.  Pt's family reports pt with h/o coughing with  liquids more than foods x3 years - not worsening significantly recently.   Family also reports pt slowly progressive speech and voice changes since his birthday this year.  Respiratory Status: Room air History of Recent Intubation: No Behavior/Cognition: Alert;Cooperative;Pleasant mood;Other (comment) (delayed responses) Oral Cavity - Dentition: Adequate natural dentition Baseline Vocal Quality: Low vocal intensity;Hoarse (family reports voice more hoarse with admit) Volitional Cough: Strong Volitional Swallow: Unable to elicit    Oral/Motor/Sensory Function Overall Oral Motor/Sensory Function:  (no focal cranial nerve deficits, generalized weakness)   Ice Chips Ice chips: Not tested   Thin Liquid Thin Liquid: Impaired Presentation: Cup;Straw Pharyngeal  Phase Impairments: Cough - Immediate;Decreased hyoid-laryngeal movement    Nectar Thick Nectar Thick Liquid: Not tested   Honey Thick Honey Thick Liquid: Not tested   Puree Puree: Not tested   Solid   GO    Solid: Impaired Oral Phase Impairments: Reduced lingual movement/coordination;Impaired anterior to posterior transit Oral Phase Functional Implications:  (delayed oral transiting)       Donavan Burnet, MS [facility] SLP [phone]

## 2012-07-14 ENCOUNTER — Inpatient Hospital Stay (HOSPITAL_COMMUNITY): Payer: Medicare Other

## 2012-07-14 DIAGNOSIS — K219 Gastro-esophageal reflux disease without esophagitis: Secondary | ICD-10-CM

## 2012-07-14 LAB — COMPREHENSIVE METABOLIC PANEL
ALT: 14 U/L (ref 0–53)
Alkaline Phosphatase: 106 U/L (ref 39–117)
CO2: 29 mEq/L (ref 19–32)
GFR calc Af Amer: 72 mL/min — ABNORMAL LOW (ref >90)
Glucose, Bld: 121 mg/dL — ABNORMAL HIGH (ref 70–99)
Potassium: 4 mEq/L (ref 3.5–5.1)
Sodium: 140 mEq/L (ref 135–145)
Total Protein: 6.4 g/dL (ref 6.0–8.3)

## 2012-07-14 MED ORDER — METOPROLOL SUCCINATE 12.5 MG HALF TABLET
12.5000 mg | ORAL_TABLET | Freq: Every day | ORAL | Status: DC
  Filled 2012-07-14: qty 1

## 2012-07-14 NOTE — Progress Notes (Signed)
Subjective:  Denies chest pain or dyspnea. More alert this am. Knows he is in "[facility]". . Objective:  Vital Signs in the last 24 hours: Temp:  [97.4 F (36.3 C)-99.3 F (37.4 C)] 99.3 F (37.4 C) (07/04 0535) Pulse Rate:  [81-89] 85 (07/04 0535) Resp:  [18-20] 20 (07/04 0535) BP: (115-126)/(47-61) 124/47 mmHg (07/04 0535) SpO2:  [93 %-94 %] 93 % (07/04 0535) Weight:  [236 lb 5.3 oz (107.2 kg)] 236 lb 5.3 oz (107.2 kg) (07/04 0535)  Intake/Output from previous day: 07/03 0701 - 07/04 0700 In: 960 [P.O.:960] Out: 2600 [Urine:2600] Intake/Output from this shift:    . albuterol  2.5 mg Nebulization BID  . aspirin EC  81 mg Oral Daily  . doxycycline  100 mg Oral Q12H  . feeding supplement  237 mL Oral BID BM  . furosemide  80 mg Oral BID  . hydrocortisone  25 mg Oral q morning - 10a  . levothyroxine  125 mcg Oral q morning - 10a  . lubriderm seriously sensitive   Topical BID  . multivitamin with minerals  1 tablet Oral Daily  . QUEtiapine  50 mg Oral QHS  . ranolazine  500 mg Oral BID  . sodium chloride  3 mL Intravenous Q12H   . sodium chloride 20 mL/hr at 07/08/12 1131    Physical Exam: The patient appears to be in no distress.  Not dyspneic on room air at rest. O2 sat 93 on room air.  Head and neck exam reveals that the pupils are equal and reactive.  The extraocular movements are full.  There is no scleral icterus.  Mouth and pharynx are benign.  No lymphadenopathy.  No carotid bruits.  The jugular venous pressure is normal.  Thyroid is not enlarged or tender.  Chest reveals  minimal inspiratory rales at bases  Heart reveals no abnormal lift or heave.  First and second heart sounds are normal.  There is no murmur gallop rub or click.  The abdomen is soft and nontender.  Bowel sounds are normoactive.  There is no hepatosplenomegaly or mass.  There are no abdominal bruits.  Extremities reveal stasis dermatitis and he has very thick ankles but no pitting edema today.  Pedal pulses are good.  There is no cyanosis or clubbing.  Neurologic exam is normal strength and no lateralizing weakness.  No sensory deficits.  Integument reveals no rash.  Lab Results:  Recent Labs  07/12/12 0515  WBC 7.2  HGB 15.2  PLT 159    Recent Labs  07/13/12 0511 07/14/12 0507  NA 138 140  K 3.4* 4.0  CL 102 103  CO2 27 29  GLUCOSE 113* 121*  BUN 15 16  CREATININE 1.08 1.08   No results found for this basename: TROPONINI, CK, MB,  in the last 72 hours Hepatic Function Panel  Recent Labs  07/14/12 0507  PROT 6.4  ALBUMIN 2.8*  AST 37  ALT 14  ALKPHOS 106  BILITOT 0.9   No results found for this basename: CHOL,  in the last 72 hours No results found for this basename: PROTIME,  in the last 72 hours  Imaging: Imaging results have been reviewed.  Chest xray stable.  Cardiac Studies: Telemetry shows normal sinus rhythm with frequent PACs.  One short run of SVT. Assessment/Plan:  1. Acute on chronic diastolic CHF 3. Normal LVF  4. HTN with borderline low BP improved. 5. Hypokalemia, resolved.. 6. Dietary noncompliance 7. history of pan hypopituitarism  Plan:  Continue current cardiac meds. EKG stable bifascicular block with QTc 532 on Ranexa. Weight down 1 lb. Good urine output. Anticipate home soon with [facility]  LOS: 6 days    Cassell Clement 07/14/2012, 7:35 AM

## 2012-07-14 NOTE — Progress Notes (Signed)
Physical Therapy Treatment Patient Details Name: Scott Mooney MRN: [id number] DOB: 09-19-1929 Today's Date: 07/14/2012 Time: 1010-1043 PT Time Calculation (min): 33 min  PT Assessment / Plan / Recommendation  PT Comments   Pt 's nephew very attentive in care of pt. Wife states plans to DC home. Pt is weak while walking, fall risk if not assisted with guidance of RW due to visual deficits.   Follow Up Recommendations  Home health PT (wife states home is plan)     Does the patient have the potential to tolerate intense rehabilitation     Barriers to Discharge        Equipment Recommendations  Rolling walker with 5" wheels    Recommendations for Other Services    Frequency Min 3X/week   Progress towards PT Goals Progress towards PT goals: Progressing toward goals  Plan Current plan remains appropriate    Precautions / Restrictions Precautions Precautions: Fall Precaution Comments: legally blind, monitor sats  Restrictions Weight Bearing Restrictions: No   Pertinent Vitals/Pain Pt ambulated on RA x 100' with sats >94%. HR 104    Mobility  Bed Mobility Bed Mobility: Rolling Left;Left Sidelying to Sit Rolling Left: 3: Mod assist Left Sidelying to Sit: 1: +2 Total assist Left Sidelying to Sit: Patient Percentage: 40% Details for Bed Mobility Assistance: multimodal cues for bed mobility Transfers Sit to Stand: 3: Mod assist;From bed;From chair/3-in-1;With upper extremity assist Details for Transfer Assistance: cues for hand placement:  one time, pt self corrected.  Assistance for legs between walker:  pt is visually impaired Ambulation/Gait Ambulation/Gait Assistance: 1: +2 Total assist Ambulation/Gait: Patient Percentage: 80% Ambulation Distance (Feet): 100 Feet (x 2) Assistive device: Rolling walker Ambulation/Gait Assistance Details: guidance for RW as pt is visually impaired. Gait Pattern: Decreased stride length;Shuffle;Trunk flexed;Step-to pattern;Decreased step  length - right;Decreased step length - left General Gait Details: Frequent VCs to step into frame of RW with limited response from pt. SaO2 94% on RA walking.     Exercises     PT Diagnosis:    PT Problem List:   PT Treatment Interventions:     PT Goals (current goals can now be found in the care plan section) Acute Rehab PT Goals Patient Stated Goal: wife and family want to take pt home  Visit Information  Last PT Received On: 07/14/12 Assistance Needed: +2 History of Present Illness: 79 y.o. male with past medical history of diastolic CHF, hypertension, hypothyroidism who presented to [facility] ED 07/08/2012 with complaints of worsening shortness of breath for past few days prior to this admission associated with non productive cough. No complaints of chest pain, fever or chills. No palpitations. Patient did endorse worsening swelling in lower extremities but no associated pain. No abdominal pain, no nausea or vomiting. No lightheadedness or dizziness or loss of consciousness.  Pt is LEGALLY BLIND    Subjective Data  Patient Stated Goal: wife and family want to take pt home   Cognition  Cognition Arousal/Alertness: Awake/alert Behavior During Therapy: WFL for tasks assessed/performed Overall Cognitive Status: Impaired/Different from baseline Area of Impairment: Safety/judgement Safety/Judgement: Decreased awareness of safety;Decreased awareness of deficits General Comments: had difficulty following cues for bed mobility    Balance  Balance Balance Assessed: Yes Static Standing Balance Static Standing - Balance Support: Bilateral upper extremity supported Static Standing - Level of Assistance: 5: Stand by assistance Static Standing - Comment/# of Minutes: 1 minute  End of Session PT - End of Session Equipment Utilized During Treatment: Gait belt  Activity Tolerance: Patient tolerated treatment well Patient left: in chair;with call bell/phone within reach;with family/visitor  present Nurse Communication: Mobility status   GP     Scott Mooney 07/14/2012, 11:21 AM Scott Mooney PT [phone]

## 2012-07-14 NOTE — Progress Notes (Signed)
[facility] PROGRESS NOTE  Scott Mooney:[id number] DOB: Feb 09, 1929 DOA: 07/08/2012 PCP: Garlan Fillers, MD  Assessment/Plan:  1-Acute on chronic  diastolic heart failure exacerbation:  -Patient presents with SOB likely due to pulmonary vascular congestion, due to diastolic CHF exacerbation -Last 2 D ECHO on file in 02/2012 with normal EF  -Imdur and Norvasc was discontinue due to soft BP.  -Appreciate cardio help; per recommendations will continue lasix of 80mg  BID  -Started on Ranexa  7-1.  -part of his swelling probably due to hypoalbuminemia; will continue ensure. -replete electrolytes -home in 1-2 days with [facility] services  Encephalopathy due to hospital acquired delirium: -B12 WNL -will now stop seroquel -mentation improved -CT neg for stroke or tumor recurrence  Hypothyroidism and hypopituitarism  -continue with current synthroid dose.  -Free T 3 and Free T 4 normal.  -TSH low but in setting of hypopituitarism.  -continue hydrocortisone  Essential hypertension  -Continue to hold  Norvasc and imdur. -Patient started on Ranexa on 7-1 per cardiology rec's. - BP Monitoring   Lower extremities cellulitis:  -Redness B/L LE: Continue with  doxy to cover for infection. Day 6/7. -redness almost completely resolved  Transaminases:  increase bilirubin: Could be secondary to hepatic congestion. RUQ [place]; Prominent hepatic steatosis. Small simple appearing hepatic and renal cysts.  -ammonia, LFT's and bilirubin now WNL  Asymptomatic VT:  -Stable. -will monitor electrolytes -will start low dose lopressor   Chronic Thrombocytopenia; stable. No signs of bleeding.  Hypotension: improved. -will start titrating hydrocortisone down -Continue holding imdur and norvasc   Hypokalemia: will monitor and replete as needed. Due to diuretics use. K is 4 today  Code Status: Full Code.  Family Communication: Care discussed with patient, multiple family members at bedside.   Disposition Plan: Remain inpatient. Family wants to take him home. CM consulted.   Consultants:  [facility].   Procedures:  None  Antibiotics:  Doxycycline 6-29.   HPI/Subjective: Patient able to follow simple commands; is more alert and oriented when awake. CT head w/o tumor or stroke.  Objective: Filed Vitals:   07/13/12 2225 07/14/12 0535 07/14/12 0929 07/14/12 1300  BP: 115/61 124/47  119/61  Pulse: 89 85  80  Temp: 98.7 F (37.1 C) 99.3 F (37.4 C)  98.9 F (37.2 C)  TempSrc: Oral Oral  Oral  Resp: 18 20  17   Height:      Weight:  107.2 kg (236 lb 5.3 oz)    SpO2: 94% 93% 91% 95%    Intake/Output Summary (Last 24 hours) at 07/14/12 1557 Last data filed at 07/14/12 1500  Gross per 24 hour  Intake    240 ml  Output   3025 ml  Net  -2785 ml   Filed Weights   07/12/12 0512 07/13/12 0540 07/14/12 0535  Weight: 108.863 kg (240 lb) 107.9 kg (237 lb 14 oz) 107.2 kg (236 lb 5.3 oz)    Exam:   General:  Afebrile, lethargic but easily aroused. When awake AOX2, able to follow simple commands and good interaction  Cardiovascular: S 1, S 2 Distant heart sounds; no rubs or gallops  Respiratory: no wheezing, no significant crackles   Abdomen: Bs present, soft, NT  Musculoskeletal: bilateral LE erythema and edema. improving  Data Reviewed: Basic Metabolic Panel:  Recent Labs Lab 07/08/12 1056 07/08/12 2205  07/10/12 0500 07/11/12 0525 07/12/12 0515 07/13/12 0511 07/14/12 0507  NA 135  --   < > 132* 135 134* 138 140  K 2.9*  --   < >  5.1 3.9 3.7 3.4* 4.0  CL 94*  --   < > 99 100 98 102 103  CO2 25  --   < > 22 26 26 27 29   GLUCOSE 114*  --   < > 121* 128* 122* 113* 121*  BUN 7  --   < > 10 15 16 15 16   CREATININE 1.12  --   < > 1.27 1.30 1.20 1.08 1.08  CALCIUM 9.3  --   < > 8.7 8.6 8.5 8.1* 8.5  MG  --  1.6  --   --   --   --   --   --   < > = values in this interval not displayed. Liver Function Tests:  Recent Labs Lab 07/08/12 1056  07/09/12 0517 07/10/12 0500 07/12/12 0515 07/14/12 0507  AST 71* 57* 60* 39* 37  ALT 30 23 22 17 14   ALKPHOS 139* 115 117 119* 106  BILITOT 3.2* 2.7* 2.6* 1.4* 0.9  PROT 7.3 6.1 6.3 6.5 6.4  ALBUMIN 3.6 2.9* 2.9* 2.9* 2.8*    Recent Labs Lab 07/10/12 1110 07/11/12 0525  AMMONIA 88* 41   CBC:  Recent Labs Lab 07/08/12 1056 07/09/12 0517 07/10/12 0500 07/11/12 0525 07/12/12 0515  WBC 7.1 5.9 7.8 7.2 7.2  HGB 17.1* 15.3 16.6 15.6 15.2  HCT 51.2 47.2 49.6 49.3 47.2  MCV 81.5 81.7 82.0 83.7 83.7  PLT 143* 135* 110* 150 159   Cardiac Enzymes:  Recent Labs Lab 07/08/12 1056  TROPONINI <0.30   BNP (last 3 results)  Recent Labs  05/15/12 2229 07/08/12 1056  PROBNP 163.6 232.4   CBG:  Recent Labs Lab 07/10/12 0956 07/11/12 0753 07/12/12 0730 07/13/12 0731 07/14/12 0753  GLUCAP 118* 128* 117* 101* 120*    Studies: Ct Head Wo Contrast  07/13/2012   *RADIOLOGY REPORT*  Clinical Data: Altered mental status, dysarthria, history of brain tumor  CT HEAD WITHOUT CONTRAST  Technique:  Contiguous axial images were obtained from the base of the skull through the vertex without contrast.  Comparison: 05/18/2012  Findings: No evidence of parenchymal hemorrhage or extra-axial fluid collection. No mass lesion, mass effect, or midline shift.  No CT evidence of acute infarction.  Calcified lesion/postsurgical changes in the sella ( series 2/image 11).  Intracranial atherosclerosis.  Mild global cortical atrophy.  No ventriculomegaly.  The visualized paranasal sinuses are essentially clear. The mastoid air cells are unopacified.  No evidence of calvarial fracture.  IMPRESSION: No evidence of acute intracranial abnormality.  Atrophy with intracranial atherosclerosis.  Postsurgical changes in the sella.   Original Report Authenticated By: Charline Bills, M.D.    Scheduled Meds: . albuterol  2.5 mg Nebulization BID  . aspirin EC  81 mg Oral Daily  . doxycycline  100 mg Oral  Q12H  . feeding supplement  237 mL Oral BID BM  . furosemide  80 mg Oral BID  . hydrocortisone  25 mg Oral q morning - 10a  . levothyroxine  125 mcg Oral q morning - 10a  . lubriderm seriously sensitive   Topical BID  . multivitamin with minerals  1 tablet Oral Daily  . ranolazine  500 mg Oral BID  . sodium chloride  3 mL Intravenous Q12H   Continuous Infusions: . sodium chloride 20 mL/hr at 07/08/12 1131    Principal Problem:   Shortness of breath Active Problems:   Essential hypertension   Chronic diastolic heart failure   Unspecified hypothyroidism    Time  spent: <30 minutes.     [facility]  [facility] Pager [phone]. If 7PM-7AM, please contact night-coverage at [email or web address], password [facility] 07/14/2012, 3:57 PM  LOS: 6 days

## 2012-07-15 LAB — BASIC METABOLIC PANEL
BUN: 18 mg/dL (ref 6–23)
Chloride: 98 mEq/L (ref 96–112)
Creatinine, Ser: 1.06 mg/dL (ref 0.50–1.35)
GFR calc Af Amer: 74 mL/min — ABNORMAL LOW (ref >90)

## 2012-07-15 MED ORDER — NEBIVOLOL HCL 2.5 MG PO TABS
2.5000 mg | ORAL_TABLET | Freq: Every day | ORAL | Status: DC
  Filled 2012-07-15 (×2): qty 1

## 2012-07-15 NOTE — Progress Notes (Signed)
Patient had 1st void since catheter removal at 1100. Will continue to monitor output. Erskin Burnet RN

## 2012-07-15 NOTE — Progress Notes (Signed)
   Subjective:  Denies chest pain or dyspnea. More alert but speech is still rambling. . Objective:  Vital Signs in the last 24 hours: Temp:  [98.3 F (36.8 C)-98.9 F (37.2 C)] 98.3 F (36.8 C) (07/05 0627) Pulse Rate:  [79-85] 79 (07/05 0627) Resp:  [17-18] 18 (07/05 0627) BP: (119-142)/(53-61) 142/53 mmHg (07/05 0627) SpO2:  [91 %-95 %] 92 % (07/05 0627)  Intake/Output from previous day: 07/04 0701 - 07/05 0700 In: 243 [P.O.:240; I.V.:3] Out: 2475 [Urine:2475] Intake/Output from this shift:    . albuterol  2.5 mg Nebulization BID  . aspirin EC  81 mg Oral Daily  . doxycycline  100 mg Oral Q12H  . feeding supplement  237 mL Oral BID BM  . furosemide  80 mg Oral BID  . hydrocortisone  25 mg Oral q morning - 10a  . levothyroxine  125 mcg Oral q morning - 10a  . lubriderm seriously sensitive   Topical BID  . metoprolol succinate  12.5 mg Oral Daily  . multivitamin with minerals  1 tablet Oral Daily  . ranolazine  500 mg Oral BID  . sodium chloride  3 mL Intravenous Q12H   . sodium chloride 20 mL/hr at 07/08/12 1131    Physical Exam: The patient appears to be in no distress.  Not dyspneic on room air at rest. O2 sat 92 on room air.  Head and neck exam reveals that the pupils are equal and reactive.  The extraocular movements are full.  There is no scleral icterus.  Mouth and pharynx are benign.  No lymphadenopathy.  No carotid bruits.  The jugular venous pressure is normal.  Thyroid is not enlarged or tender.  Chest reveals mild expiratory wheezes.  Heart reveals no abnormal lift or heave.  First and second heart sounds are normal.  There is no murmur gallop rub or click.  The abdomen is soft and nontender.  Bowel sounds are normoactive.  There is no hepatosplenomegaly or mass.  There are no abdominal bruits.  Extremities reveal stasis dermatitis and he has very thick ankles but no pitting edema today. Pedal pulses are good.  There is no cyanosis or  clubbing.  Neurologic exam is normal strength and no lateralizing weakness.  No sensory deficits.  Integument reveals no rash.  Lab Results: No results found for this basename: WBC, HGB, PLT,  in the last 72 hours  Recent Labs  07/14/12 0507 07/15/12 0425  NA 140 137  K 4.0 3.5  CL 103 98  CO2 29 31  GLUCOSE 121* 115*  BUN 16 18  CREATININE 1.08 1.06   No results found for this basename: TROPONINI, CK, MB,  in the last 72 hours Hepatic Function Panel  Recent Labs  07/14/12 0507  PROT 6.4  ALBUMIN 2.8*  AST 37  ALT 14  ALKPHOS 106  BILITOT 0.9   No results found for this basename: CHOL,  in the last 72 hours No results found for this basename: PROTIME,  in the last 72 hours  Imaging: Imaging results have been reviewed.  Chest xray stable.  Cardiac Studies: Telemetry shows NSR with rare PVC and PAC Assessment/Plan:  1. Acute on chronic diastolic CHF 3. Normal LVF  4. HTN with borderline low BP improved. 5. Hypokalemia, resolved.. 6. Dietary noncompliance 7. history of pan hypopituitarism  Plan: Continue current cardiac meds.  Anticipate home soon with [facility]  LOS: 7 days    Cassell Clement 07/15/2012, 7:33 AM

## 2012-07-15 NOTE — Progress Notes (Signed)
[facility] PROGRESS NOTE  Scott Mooney:[id number] DOB: 03-23-1929 DOA: 07/08/2012 PCP: Garlan Fillers, MD  Assessment/Plan:  1-Acute on chronic  diastolic heart failure exacerbation:  -Patient presents with SOB likely due to pulmonary vascular congestion, due to diastolic CHF exacerbation -Last 2 D ECHO on file in 02/2012 with normal EF  -Imdur and Norvasc was discontinue due to soft BP.  -Appreciate cardio help; per recommendations will continue lasix of 80mg  BID  -Started on Ranexa  7-1.  -part of his swelling probably due to hypoalbuminemia; will continue ensure. -replete electrolytes -home tomorrow most likely with [facility] services -will d/c foley and will start IS  Encephalopathy due to hospital acquired delirium: -B12 WNL -will now stop seroquel -mentation improved -CT neg for stroke or tumor recurrence  Hypothyroidism and hypopituitarism  -continue with current synthroid dose.  -Free T 3 and Free T 4 normal.  -TSH low but in setting of hypopituitarism.  -continue hydrocortisone  Essential hypertension  -Continue to hold  Norvasc and imdur. -Patient started on Ranexa on 7-1 per cardiology rec's. - BP Monitoring   Lower extremities cellulitis:  -Redness B/L LE: Continue with  doxy to cover for infection. Day 7/7. -redness almost completely resolved  Transaminases:  increase bilirubin: Could be secondary to hepatic congestion. RUQ [place]; Prominent hepatic steatosis. Small simple appearing hepatic and renal cysts.  -ammonia, LFT's and bilirubin now WNL  Asymptomatic VT:  -Stable. -will monitor electrolytes -will start low dose bystolic  Chronic Thrombocytopenia; stable. No signs of bleeding.  Hypotension: improved. -will start titrating hydrocortisone down -Continue holding imdur and norvasc   Hypokalemia: will monitor and replete as needed. Due to diuretics use. K is 3.5 today  Code Status: Full Code.  Family Communication: Care discussed with  patient, multiple family members at bedside.  Disposition Plan: Remain inpatient. Family wants to take him home. CM consulted.   Consultants:  [facility].   Procedures:  None  Antibiotics:  Doxycycline 6-29.   HPI/Subjective: Patient more alert today; last night with an episode of confusion. Afebrile. X-ray of his chest demonstrating significant improvement in his pulmonary vascular congestion; no infiltrates.  Godd O2 sat on RA.  Objective: Filed Vitals:   07/14/12 2034 07/14/12 2059 07/15/12 0627 07/15/12 0747  BP:  122/54 142/53   Pulse: 83 85 79   Temp:  98.4 F (36.9 C) 98.3 F (36.8 C)   TempSrc:  Oral Oral   Resp: 18 18 18    Height:      Weight:      SpO2: 95% 94% 92% 91%    Intake/Output Summary (Last 24 hours) at 07/15/12 0955 Last data filed at 07/15/12 8119  Gross per 24 hour  Intake      3 ml  Output   2475 ml  Net  -2472 ml   Filed Weights   07/12/12 0512 07/13/12 0540 07/14/12 0535  Weight: 108.863 kg (240 lb) 107.9 kg (237 lb 14 oz) 107.2 kg (236 lb 5.3 oz)    Exam:   General:  Afebrile, AAOX2, no nausea or vomiting  Cardiovascular: S 1, S 2 Distant heart sounds; no rubs or gallops  Respiratory: no wheezing, no crackles   Abdomen: Bs present, soft, NT  Musculoskeletal: bilateral LE erythema and edema. improving  Data Reviewed: Basic Metabolic Panel:  Recent Labs Lab 07/08/12 1056 07/08/12 2205  07/11/12 0525 07/12/12 0515 07/13/12 0511 07/14/12 0507 07/15/12 0425  NA 135  --   < > 135 134* 138 140 137  K 2.9*  --   < > 3.9 3.7 3.4* 4.0 3.5  CL 94*  --   < > 100 98 102 103 98  CO2 25  --   < > 26 26 27 29 31   GLUCOSE 114*  --   < > 128* 122* 113* 121* 115*  BUN 7  --   < > 15 16 15 16 18   CREATININE 1.12  --   < > 1.30 1.20 1.08 1.08 1.06  CALCIUM 9.3  --   < > 8.6 8.5 8.1* 8.5 8.4  MG  --  1.6  --   --   --   --   --   --   < > = values in this interval not displayed. Liver Function Tests:  Recent Labs Lab  07/08/12 1056 07/09/12 0517 07/10/12 0500 07/12/12 0515 07/14/12 0507  AST 71* 57* 60* 39* 37  ALT 30 23 22 17 14   ALKPHOS 139* 115 117 119* 106  BILITOT 3.2* 2.7* 2.6* 1.4* 0.9  PROT 7.3 6.1 6.3 6.5 6.4  ALBUMIN 3.6 2.9* 2.9* 2.9* 2.8*    Recent Labs Lab 07/10/12 1110 07/11/12 0525  AMMONIA 88* 41   CBC:  Recent Labs Lab 07/08/12 1056 07/09/12 0517 07/10/12 0500 07/11/12 0525 07/12/12 0515  WBC 7.1 5.9 7.8 7.2 7.2  HGB 17.1* 15.3 16.6 15.6 15.2  HCT 51.2 47.2 49.6 49.3 47.2  MCV 81.5 81.7 82.0 83.7 83.7  PLT 143* 135* 110* 150 159   Cardiac Enzymes:  Recent Labs Lab 07/08/12 1056  TROPONINI <0.30   BNP (last 3 results)  Recent Labs  05/15/12 2229 07/08/12 1056  PROBNP 163.6 232.4   CBG:  Recent Labs Lab 07/11/12 0753 07/12/12 0730 07/13/12 0731 07/14/12 0753 07/15/12 0738  GLUCAP 128* 117* 101* 120* 126*    Studies: Dg Chest 2 View  07/14/2012   *RADIOLOGY REPORT*  Clinical Data: Shortness of breath and history of CHF.  CHEST - 2 VIEW  Comparison: 07/11/2012  Findings: Two views of the chest demonstrate very low lung volumes. Prominent interstitial lung densities could represent a combination of edema and atelectasis.  Heart size is upper limits of normal but may be accentuated from the low lung volumes.  IMPRESSION: Low lung volumes with prominent lung markings.  Findings probably represent a combination of atelectasis and mild edema.   Original Report Authenticated By: Richarda Overlie, M.D.   Ct Head Wo Contrast  07/13/2012   *RADIOLOGY REPORT*  Clinical Data: Altered mental status, dysarthria, history of brain tumor  CT HEAD WITHOUT CONTRAST  Technique:  Contiguous axial images were obtained from the base of the skull through the vertex without contrast.  Comparison: 05/18/2012  Findings: No evidence of parenchymal hemorrhage or extra-axial fluid collection. No mass lesion, mass effect, or midline shift.  No CT evidence of acute infarction.  Calcified  lesion/postsurgical changes in the sella ( series 2/image 11).  Intracranial atherosclerosis.  Mild global cortical atrophy.  No ventriculomegaly.  The visualized paranasal sinuses are essentially clear. The mastoid air cells are unopacified.  No evidence of calvarial fracture.  IMPRESSION: No evidence of acute intracranial abnormality.  Atrophy with intracranial atherosclerosis.  Postsurgical changes in the sella.   Original Report Authenticated By: Charline Bills, M.D.    Scheduled Meds: . albuterol  2.5 mg Nebulization BID  . aspirin EC  81 mg Oral Daily  . doxycycline  100 mg Oral Q12H  . feeding supplement  237 mL Oral BID BM  .  furosemide  80 mg Oral BID  . hydrocortisone  25 mg Oral q morning - 10a  . levothyroxine  125 mcg Oral q morning - 10a  . lubriderm seriously sensitive   Topical BID  . multivitamin with minerals  1 tablet Oral Daily  . nebivolol  2.5 mg Oral Daily  . ranolazine  500 mg Oral BID  . sodium chloride  3 mL Intravenous Q12H   Continuous Infusions: . sodium chloride 20 mL/hr at 07/08/12 1131    Principal Problem:   Shortness of breath Active Problems:   Essential hypertension   Chronic diastolic heart failure   Unspecified hypothyroidism    Time spent: <30 minutes.     [facility]  [facility] Pager [phone]. If 7PM-7AM, please contact night-coverage at [email or web address], password [facility] 07/15/2012, 9:55 AM  LOS: 7 days

## 2012-07-15 NOTE — Progress Notes (Signed)
Patient's BP a little soft, called cardiology. Tereso Newcomer said that it was okay to hold this am's dose of bystolic. Will continue to monitor. Erskin Burnet RN

## 2012-07-16 LAB — GLUCOSE, CAPILLARY

## 2012-07-16 MED ORDER — POTASSIUM CHLORIDE CRYS ER 20 MEQ PO TBCR: 40.0000 meq | EXTENDED_RELEASE_TABLET | Freq: Every day | ORAL | Status: AC

## 2012-07-16 MED ORDER — FUROSEMIDE 40 MG PO TABS: 80.0000 mg | ORAL_TABLET | Freq: Two times a day (BID) | ORAL | Status: DC

## 2012-07-16 MED ORDER — LUBRIDERM SERIOUSLY SENSITIVE EX LOTN: 1.0000 "application " | TOPICAL_LOTION | Freq: Two times a day (BID) | CUTANEOUS | Status: DC

## 2012-07-16 MED ORDER — HYDROCORTISONE 20 MG PO TABS: 20.0000 mg | ORAL_TABLET | Freq: Every morning | ORAL | Status: DC

## 2012-07-16 MED ORDER — ENSURE COMPLETE PO LIQD: 237.0000 mL | Freq: Two times a day (BID) | ORAL | Status: DC

## 2012-07-16 MED ORDER — RANOLAZINE ER 500 MG PO TB12: 500.0000 mg | ORAL_TABLET | Freq: Two times a day (BID) | ORAL | Status: DC

## 2012-07-16 NOTE — Progress Notes (Signed)
Subjective:  Denies chest pain or dyspnea. More alert.  Sat up in chair yesterday. . Objective:  Vital Signs in the last 24 hours: Temp:  [98.2 F (36.8 C)-98.7 F (37.1 C)] 98.7 F (37.1 C) (07/06 0509) Pulse Rate:  [79-89] 80 (07/06 0509) Resp:  [18-24] 20 (07/06 0509) BP: (105-147)/(56-65) 147/62 mmHg (07/06 0509) SpO2:  [91 %-96 %] 94 % (07/06 0509) Weight:  [236 lb 12.8 oz (107.412 kg)-237 lb 9.6 oz (107.775 kg)] 237 lb 9.6 oz (107.775 kg) (07/06 0509)  Intake/Output from previous day: 07/05 0701 - 07/06 0700 In: 2738.7 [P.O.:480; I.V.:2258.7] Out: 2025 [Urine:2025] Intake/Output from this shift:    . albuterol  2.5 mg Nebulization BID  . aspirin EC  81 mg Oral Daily  . doxycycline  100 mg Oral Q12H  . feeding supplement  237 mL Oral BID BM  . furosemide  80 mg Oral BID  . hydrocortisone  25 mg Oral q morning - 10a  . levothyroxine  125 mcg Oral q morning - 10a  . lubriderm seriously sensitive   Topical BID  . multivitamin with minerals  1 tablet Oral Daily  . nebivolol  2.5 mg Oral Daily  . ranolazine  500 mg Oral BID  . sodium chloride  3 mL Intravenous Q12H   . sodium chloride Stopped (07/15/12 0747)    Physical Exam: The patient appears to be in no distress.  Not dyspneic on room air at rest. O2 sat 94 on room air.  Head and neck exam reveals that the pupils are equal and reactive.  The extraocular movements are full.  There is no scleral icterus.  Mouth and pharynx are benign.  No lymphadenopathy.  No carotid bruits.  The jugular venous pressure is normal.  Thyroid is not enlarged or tender.  Chest reveals mild expiratory wheezes.  Heart reveals no abnormal lift or heave.  First and second heart sounds are normal.  There is no murmur gallop rub or click.  The abdomen is soft and nontender.  Bowel sounds are normoactive.  There is no hepatosplenomegaly or mass.  There are no abdominal bruits.  Extremities reveal stasis dermatitis and he has very thick  ankles but no pitting edema today. Pedal pulses are good.  There is no cyanosis or clubbing.  Neurologic exam is normal strength and no lateralizing weakness.  No sensory deficits.  Integument reveals no rash.  Lab Results: No results found for this basename: WBC, HGB, PLT,  in the last 72 hours  Recent Labs  07/14/12 0507 07/15/12 0425  NA 140 137  K 4.0 3.5  CL 103 98  CO2 29 31  GLUCOSE 121* 115*  BUN 16 18  CREATININE 1.08 1.06   No results found for this basename: TROPONINI, CK, MB,  in the last 72 hours Hepatic Function Panel  Recent Labs  07/14/12 0507  PROT 6.4  ALBUMIN 2.8*  AST 37  ALT 14  ALKPHOS 106  BILITOT 0.9   No results found for this basename: CHOL,  in the last 72 hours No results found for this basename: PROTIME,  in the last 72 hours  Imaging: Imaging results have been reviewed.  Chest xray stable.  Cardiac Studies: Telemetry shows NSR with occasional PVCs. Assessment/Plan:  1. Acute on chronic diastolic CHF 3. Normal LVF  4. HTN with borderline low BP improved. 5. Hypokalemia, resolved.. 6. Dietary noncompliance 7. history of pan hypopituitarism  Plan: Continue current cardiac meds. Continue to increase activity. Anticipate home  soon with HHRN  LOS: 8 days    Cassell Clement 07/16/2012, 7:34 AM

## 2012-07-16 NOTE — Progress Notes (Signed)
Patient is awake and feeling better since his upset stomach this am that delayed his discharge. Feels ready to go now.  Home health in place. Erskin Burnet RN

## 2012-07-16 NOTE — Discharge Summary (Signed)
Physician Discharge Summary  Scott Mooney AVW:[id number] DOB: 03-31-1929 DOA: 07/08/2012  PCP: Garlan Fillers, MD  Admit date: 07/08/2012 Discharge date: 07/16/2012  Time spent: >30 minutes  Recommendations for Outpatient Follow-up:  BMET to follow renal function and electrolytes Reassess BP and adjust medications as needed Will follow with cardiology for further evaluation, treatment and medication adjustment for his CHF.  Discharge Diagnoses:  Principal Problem:   Shortness of breath Active Problems:   Essential hypertension   Chronic diastolic heart failure   Unspecified hypothyroidism   Discharge Condition: stable and improved. Will discharge to home with [facility] services and family care.  Diet recommendation: heart healthy diet  Filed Weights   07/14/12 0535 07/15/12 1800 07/16/12 0509  Weight: 107.2 kg (236 lb 5.3 oz) 107.412 kg (236 lb 12.8 oz) 107.775 kg (237 lb 9.6 oz)    History of present illness:  79 y.o. male with past medical history of diastolic CHF, hypertension, hypothyroidism who presented to [facility] ED 07/08/2012 with complaints of worsening shortness of breath for past few days prior to this admission associated with non productive cough. No complaints of chest pain, fever or chills. No palpitations. Patient did endorse worsening swelling in lower extremities but no associated pain. No abdominal pain, no nausea or vomiting. No lightheadedness or dizziness or loss of consciousness.  In ED, vital signs remain stable. CXR showed vascular congestion and interstitial edema. BMP revealed hypokalemia of 2.9 and this was supplemented in ED. Patient was alos given lasix 60 mg IV one dose. BNP was 232. CBC revealed platelet counts of 143.    Hospital Course:  1-Acute on chronic diastolic heart failure exacerbation:  -Patient presents with SOB likely due to pulmonary vascular congestion, due to diastolic CHF exacerbation  -good O2 sat on RA; no CP or SOB at discharge -Last 2 D  ECHO on file in 02/2012 with normal EF and grade 2 diastolic dysfunction. -Imdur and Norvasc has been discontinue due to soft BP.  -Appreciate cardio help; per recommendations will continue lasix of 80mg  BID and instructed to follow heart healthy diet and watch fluid intake. -Started on Ranexa 7-1 per cardiology recommendations  -part of his swelling probably due to hypoalbuminemia; will continue ensure.  -will d/c home with [facility] services   Encephalopathy due to hospital acquired delirium:  -B12 WNL  -good response to seroquel and orientation -at discharge was able to recognize family members, AAOX2 (missing just the year; but able to say month and talk about major holiday in July) -CT neg for stroke or tumor recurrence   Hypothyroidism and hypopituitarism  -continue with current synthroid dose.  -Free T 3 and Free T 4 normal.  -TSH low but in setting of hypopituitarism.  -continue hydrocortisone   Essential hypertension: with hypotension epeisodes -Continue to hold Norvasc, b-blocker and imdur.  -Patient started on Ranexa on 7-1 per cardiology rec's.  -will follow with cardiology at discharge   Lower extremities cellulitis:  -Redness B/L LE improved. No pain. Patient completed doxycycline therapy of 7 days tx while inpatient. -some of the erythema left due to stasis dermatitis.  Transaminases: increase bilirubin: Could be secondary to hepatic congestion. RUQ [place]; Prominent hepatic steatosis. Small simple appearing hepatic and renal cysts.  -ammonia, LFT's and bilirubin now WNL   Asymptomatic VT:  -Stable.  -electrolytes WNL -per cardiology recommendations hold on any b-blocker and calcium channel blockers for now; they will reassess after discharge.  Chronic Thrombocytopenia; stable. No signs of bleeding.   Hypotension: improved and  stable.  -will continue hydrocortisone at 20mg  daily -Continue holding imdur, b-blocker and norvasc until follow up with PCP and cardiologist as an  outpatient.  Hypokalemia: will monitor and replete as needed. Due to diuretics use. repleted and WNL at discharge -Will continue daily maintenance supplementation  Hypopituitarism: continue hydrocortisone  *rest of medical problems remains stable and the plan is to continue current medication regimen.  Procedures: See below for x-ray reports.  Consultations:  Cardiology (Dr. Patty Sermons)  PT/OT  Discharge Exam: Filed Vitals:   07/15/12 1947 07/15/12 2059 07/16/12 0509 07/16/12 0834  BP:  127/62 147/62   Pulse:  81 80   Temp:  98.2 F (36.8 C) 98.7 F (37.1 C)   TempSrc:  Axillary Oral   Resp:  24 20   Height:      Weight:   107.775 kg (237 lb 9.6 oz)   SpO2: 96% 94% 94% 92%    General: NAD, afebrile, AAOX2 Cardiovascular: S1 and S2, no M/R/G Respiratory: CTA bilaterally Abdomen:soft, NT, ND, positive BS Extremities: improving edema, slight redness (due stasis dermatitis/healing cellulitis); no cyanosis Neuro: no focal deficit.   Discharge Instructions  Discharge Orders   Future Orders Complete By Expires     Diet - low sodium heart healthy  As directed     Discharge instructions  As directed     Comments:      Take medications as prescribed Follow with PCP in 7-10 days Follow up with cardiology service as an outpatient (call office for appointment details) Follow a heart healthy diet Follow instructions for decrease risk of aspiration        Medication List    STOP taking these medications       amLODipine 5 MG tablet  Commonly known as:  NORVASC     carvedilol 6.25 MG tablet  Commonly known as:  COREG     isosorbide mononitrate 60 MG 24 hr tablet  Commonly known as:  IMDUR      TAKE these medications       aspirin EC 81 MG tablet  Take 81 mg by mouth daily.     feeding supplement Liqd  Take 237 mLs by mouth 2 (two) times daily between meals.     furosemide 40 MG tablet  Commonly known as:  LASIX  Take 2 tablets (80 mg total) by mouth 2  (two) times daily. Take 2 tab by mouth twice a day.     hydrocortisone 20 MG tablet  Commonly known as:  CORTEF  Take 1 tablet (20 mg total) by mouth every morning.     levothyroxine 125 MCG tablet  Commonly known as:  SYNTHROID, LEVOTHROID  Take 125 mcg by mouth every morning.     lubriderm seriously sensitive Lotn  Apply 1 application topically 2 (two) times daily. Apply on his Lower extremities skin to keep it moist     multivitamin per tablet  Take 1 tablet by mouth daily.     potassium chloride SA 20 MEQ tablet  Commonly known as:  K-DUR,KLOR-CON  Take 2 tablets (40 mEq total) by mouth daily.     ranolazine 500 MG 12 hr tablet  Commonly known as:  RANEXA  Take 1 tablet (500 mg total) by mouth 2 (two) times daily.       Allergies  Allergen Reactions  . Penicillins Swelling and Other (See Comments)    Head and feet swelling, spent 5 days in hospital as a result of taking med  Follow-up Information   Follow up with Garlan Fillers, MD. Schedule an appointment as soon as possible for a visit in 10 days.   Contact information:   [address] [facility] [place] [address] [phone]       Follow up with Cassell Clement, MD. (Call office to set up appointment)    Contact information:   [address] [place] [address] [phone]        The results of significant diagnostics from this hospitalization (including imaging, microbiology, ancillary and laboratory) are listed below for reference.    Significant Diagnostic Studies: Dg Chest 2 View  07/14/2012   *RADIOLOGY REPORT*  Clinical Data: Shortness of breath and history of CHF.  CHEST - 2 VIEW  Comparison: 07/11/2012  Findings: Two views of the chest demonstrate very low lung volumes. Prominent interstitial lung densities could represent a combination of edema and atelectasis.  Heart size is upper limits of normal but may be accentuated from the low lung  volumes.  IMPRESSION: Low lung volumes with prominent lung markings.  Findings probably represent a combination of atelectasis and mild edema.   Original Report Authenticated By: Richarda Overlie, M.D.   Dg Chest 2 View  07/11/2012   *RADIOLOGY REPORT*  Clinical Data:  CHF, cough, congestion, shortness of breath, wheezing  CHEST - 2 VIEW  Comparison: 07/08/2012  Findings: Enlargement of cardiac silhouette with pulmonary vascular congestion. Atherosclerotic calcification aorta. Bibasilar atelectasis, unable to exclude left lower lobe consolidation. Upper lungs grossly clear. No pleural effusion or pneumothorax. Bones demineralized. Low lung volumes noted.  IMPRESSION: Low lung volumes with bibasilar atelectasis. Unable to exclude left lower lobe consolidation. Enlargement of cardiac silhouette with pulmonary vascular congestion.   Original Report Authenticated By: Ulyses Southward, M.D.   Dg Chest 2 View  07/08/2012   *RADIOLOGY REPORT*  Clinical Data: Cough and body aches  CHEST - 2 VIEW  Comparison: 05/15/2012  Findings: Mild cardiomegaly.  Low volumes.  Vascular congestion. Interstitial edema has not significantly changed.  Elevation of the right hemidiaphragm is noted.  No pneumothorax. Lower thoracic vertebral plana deformity is again noted.  IMPRESSION: Stable vascular congestion and interstitial edema.   Original Report Authenticated By: Jolaine Click, M.D.   Ct Head Wo Contrast  07/13/2012   *RADIOLOGY REPORT*  Clinical Data: Altered mental status, dysarthria, history of brain tumor  CT HEAD WITHOUT CONTRAST  Technique:  Contiguous axial images were obtained from the base of the skull through the vertex without contrast.  Comparison: 05/18/2012  Findings: No evidence of parenchymal hemorrhage or extra-axial fluid collection. No mass lesion, mass effect, or midline shift.  No CT evidence of acute infarction.  Calcified lesion/postsurgical changes in the sella ( series 2/image 11).  Intracranial atherosclerosis.  Mild  global cortical atrophy.  No ventriculomegaly.  The visualized paranasal sinuses are essentially clear. The mastoid air cells are unopacified.  No evidence of calvarial fracture.  IMPRESSION: No evidence of acute intracranial abnormality.  Atrophy with intracranial atherosclerosis.  Postsurgical changes in the sella.   Original Report Authenticated By: Charline Bills, M.D.   US Abdomen Complete  07/09/2012   *RADIOLOGY REPORT*  Clinical Data:  Elevated liver transaminases and bilirubin.  COMPLETE ABDOMINAL ULTRASOUND  Comparison:  02/14/2009; 02/05/2009  Findings:  Gallbladder:  Surgically absent.  Common bile duct:  Measures 6 mm in diameter, within normal limits.  Liver:  Echogenic with prominent sonic attenuation favoring diffuse hepatic steatosis.  Small cyst in the hepatic periphery  measures up to 0.8 cm in long axis.  IVC:  Poorly seen due to overlying bowel gas.  Pancreas:  Not visualized due to overlying bowel gas.  Spleen:  Splenomegaly to observe the spleen measuring 16.2 x 7.8 x 14.6 cm yielding a volume of 966 ml.  Right Kidney:  Measures 12.6 cm in length and contains a 2.3 x 1.8 x 2.1 cm cyst.  Renal cortical echogenicity within normal limits.  Left Kidney:  Measures 12.8 cm in length with some areas of cortical thinning.  The renal cysts on the left include a 5.9 x 4 by 3 x 4.6 cm cyst without obvious internal nodularity.  No hydronephrosis or mass observed.  Abdominal aorta:  Not well seen due to overlying bowel gas. Maximum visualized diameter 2.5 cm.  IMPRESSION:  1.  Prominent hepatic steatosis. 2.  Small simple appearing hepatic and renal cysts. 3.  Splenomegaly with splenic volume 966 ml. 4.  Poor visualization of the pancreas, IVC, and abdominal aorta due to overlying bowel gas.   Original Report Authenticated By: Gaylyn Rong, M.D.    Labs: Basic Metabolic Panel:  Recent Labs Lab 07/11/12 0525 07/12/12 0515 07/13/12 0511 07/14/12 0507 07/15/12 0425  NA 135 134* 138 140  137  K 3.9 3.7 3.4* 4.0 3.5  CL 100 98 102 103 98  CO2 26 26 27 29 31   GLUCOSE 128* 122* 113* 121* 115*  BUN 15 16 15 16 18   CREATININE 1.30 1.20 1.08 1.08 1.06  CALCIUM 8.6 8.5 8.1* 8.5 8.4   Liver Function Tests:  Recent Labs Lab 07/10/12 0500 07/12/12 0515 07/14/12 0507  AST 60* 39* 37  ALT 22 17 14   ALKPHOS 117 119* 106  BILITOT 2.6* 1.4* 0.9  PROT 6.3 6.5 6.4  ALBUMIN 2.9* 2.9* 2.8*    Recent Labs Lab 07/10/12 1110 07/11/12 0525  AMMONIA 88* 41   CBC:  Recent Labs Lab 07/10/12 0500 07/11/12 0525 07/12/12 0515  WBC 7.8 7.2 7.2  HGB 16.6 15.6 15.2  HCT 49.6 49.3 47.2  MCV 82.0 83.7 83.7  PLT 110* 150 159   BNP: BNP (last 3 results)  Recent Labs  05/15/12 2229 07/08/12 1056  PROBNP 163.6 232.4   CBG:  Recent Labs Lab 07/12/12 0730 07/13/12 0731 07/14/12 0753 07/15/12 0738 07/16/12 0730  GLUCAP 117* 101* 120* 126* 103*    Signed:  Marleigh Kaylor  [facility] 07/16/2012, 10:28 AM

## 2012-07-16 NOTE — Care Management Note (Signed)
Cm spoke with [facility] rep Chas[place] concerning pt discharge. Per [facility] starts of care scheduled 07/18/12. Pt agreeable to plan of care. No DME required. Family to provide tx home.   Roxy Manns Ajane Novella,RN,BSN [phone]

## 2012-07-17 ENCOUNTER — Telehealth: Payer: Self-pay | Admitting: Cardiology

## 2012-07-17 NOTE — Telephone Encounter (Signed)
Pt was dc from hosp for CHF exacerbation. Verbal order given to continue care, told her to send orders.

## 2012-07-17 NOTE — Progress Notes (Signed)
Pt family member called stating they never received shower chair from [facility]. MD paged regarding pt/family request for shower chair. Order received. Faxed order and information to [facility]. Pt family Rohonda called. Scott Mooney A

## 2012-07-17 NOTE — Telephone Encounter (Signed)
New problem   [facility] is need re certification for pt to continue their services.

## 2012-07-17 NOTE — Telephone Encounter (Signed)
Agree. Home health to continue to see. Some of his medicines have changed.

## 2012-07-18 ENCOUNTER — Ambulatory Visit: Payer: Medicare Other | Admitting: Cardiology

## 2012-07-19 ENCOUNTER — Telehealth: Payer: Self-pay | Admitting: Cardiology

## 2012-07-19 NOTE — Telephone Encounter (Signed)
Agree 

## 2012-07-19 NOTE — Telephone Encounter (Signed)
Verbal ok given.

## 2012-07-19 NOTE — Telephone Encounter (Signed)
New problem     Just recent discharge from hospital   Need verbal approval to continue care   1 week  x  1.   2 weeks x 5 .

## 2012-08-02 ENCOUNTER — Ambulatory Visit: Payer: Medicare Other | Admitting: Cardiology

## 2012-08-07 ENCOUNTER — Encounter: Payer: Self-pay | Admitting: Cardiology

## 2012-08-07 ENCOUNTER — Ambulatory Visit (INDEPENDENT_AMBULATORY_CARE_PROVIDER_SITE_OTHER): Payer: Medicare Other | Admitting: Cardiology

## 2012-08-07 VITALS — BP 126/76 | HR 77 | Ht 64.0 in | Wt 223.4 lb

## 2012-08-07 DIAGNOSIS — I509 Heart failure, unspecified: Secondary | ICD-10-CM

## 2012-08-07 DIAGNOSIS — I5032 Chronic diastolic (congestive) heart failure: Secondary | ICD-10-CM

## 2012-08-07 DIAGNOSIS — I452 Bifascicular block: Secondary | ICD-10-CM

## 2012-08-07 DIAGNOSIS — I10 Essential (primary) hypertension: Secondary | ICD-10-CM

## 2012-08-07 NOTE — Progress Notes (Signed)
Scott Mooney Date of Birth:  [birth year] [facility] [address] [place], [place]  [address] [phone]         Fax   [phone]  History of Present Illness: This pleasant 79 year old gentleman is seen for a post hospital office visit. He was hospitalized from 05/15/2012 until 05/18/2012 with acute on chronic diastolic congestive heart failure. He has a history of compensated diastolic congestive heart failure.  Echocardiogram in February 2014 showed vigorous left ventricular systolic function with ejection fraction 65-70%.  There was grade 2 diastolic dysfunction.  Mitral valve showed systolic anterior motion and mild to moderate regurgitation and his pulmonary artery pressure was 36. The patient does not have any history of ischemic heart disease. He has not had cardiac catheterization. He did have a normal nuclear stress test at [facility] in 2000.  The patient was rehospitalized for exacerbation of CHF from 07/08/12 until 07/16/12.  He responded to high-dose Lasix and to the addition of Ranexa.  In the hospital he was quite confused.  Since going home his confusion has cleared.   Current Outpatient Prescriptions  Medication Sig Dispense Refill  . aspirin EC 81 MG tablet Take 81 mg by mouth daily.      . Emollient (LUBRIDERM SERIOUSLY SENSITIVE) LOTN Apply 1 application topically 2 (two) times daily. Apply on his Lower extremities skin to keep it moist  437 mL  0  . feeding supplement (ENSURE COMPLETE) LIQD Take 237 mLs by mouth 2 (two) times daily between meals.      . furosemide (LASIX) 40 MG tablet Take 80 mg by mouth.      . hydrocortisone (CORTEF) 20 MG tablet Take 1 tablet (20 mg total) by mouth every morning.  30 tablet  1  . levothyroxine (SYNTHROID, LEVOTHROID) 125 MCG tablet Take 125 mcg by mouth every morning.      . multivitamin (THERAGRAN) per tablet Take 1 tablet by mouth daily.        . ondansetron (ZOFRAN) 4 MG tablet Take 4 mg by mouth daily.       . potassium chloride SA (K-DUR,KLOR-CON) 20 MEQ tablet Take 2 tablets (40 mEq total) by mouth daily.  60 tablet  3  . ranolazine (RANEXA) 500 MG 12 hr tablet Take 1 tablet (500 mg total) by mouth 2 (two) times daily.  60 tablet  1   No current facility-administered medications for this visit.    Allergies  Allergen Reactions  . Penicillins Swelling and Other (See Comments)    Head and feet swelling, spent 5 days in hospital as a result of taking med    Patient Active Problem List   Diagnosis Date Noted  . Shortness of breath 07/08/2012  . Unspecified hypothyroidism 07/08/2012  . Chronic diastolic heart failure 06/22/2012  . Essential hypertension 05/17/2012  . Bifascicular block 02/10/2011  . GERD (gastroesophageal reflux disease)     History  Smoking status  . Former Smoker  . Quit date: 01/11/1962  Smokeless tobacco  . Never Used    History  Alcohol Use No    Family History  Problem Relation Age of Onset  . Colon cancer Brother   . Cancer Sister     ?  . Stroke Father   . Skin cancer Mother     Review of Systems: Constitutional: no fever chills diaphoresis or fatigue or change in weight.  Head and neck: no hearing loss, no epistaxis, no photophobia or visual disturbance. Respiratory: No cough, shortness  of breath or wheezing. Cardiovascular: No chest pain peripheral edema, palpitations. Gastrointestinal: No abdominal distention, no abdominal pain, no change in bowel habits hematochezia or melena. Genitourinary: No dysuria, no frequency, no urgency, no nocturia. Musculoskeletal:No arthralgias, no back pain, no gait disturbance or myalgias. Neurological: No dizziness, no headaches, no numbness, no seizures, no syncope, no weakness, no tremors. Hematologic: No lymphadenopathy, no easy bruising. Psychiatric: No confusion, no hallucinations, no sleep disturbance.    Physical Exam: Filed Vitals:   08/07/12 1601  BP: 126/76  Pulse: 77   the general  appearance reveals a well-developed well-nourished elderly gentleman in no distress.  He is much more alert and appropriate than he was in the hospital.The head and neck exam reveals pupils equal and reactive.  Extraocular movements are full.  There is no scleral icterus.  The mouth and pharynx are normal.  The neck is supple.  The carotids reveal no bruits.  The jugular venous pressure is normal.  The  thyroid is not enlarged.  There is no lymphadenopathy.  The chest is clear to percussion and auscultation.  There are no rales or rhonchi.  Expansion of the chest is symmetrical.  The precordium is quiet.  The first heart sound is normal.  The second heart sound is physiologically split.  There is no murmur gallop rub or click.  There is no abnormal lift or heave.  The abdomen is soft and nontender.  The bowel sounds are normal.  The liver and spleen are not enlarged.  There are no abdominal masses.  There are no abdominal bruits.  Extremities reveal good pedal pulses.  There is no phlebitis or edema.  There is no cyanosis or clubbing.  Strength is normal and symmetrical in all extremities.  There is no lateralizing weakness.  There are no sensory deficits.  The skin is warm and dry.  There is no rash.  EKG today shows normal sinus rhythm with bifascicular block.  Tracing is unchanged since 07/13/12 except PVCs are no longer seen   Assessment / Plan: Continue on same medication.  Recheck in 3 months for followup office visit and EKG.

## 2012-08-07 NOTE — Patient Instructions (Signed)
Your physician wants you to follow-up in: 3 months with Dr. Patty Sermons. You will receive a reminder letter in the mail two months in advance. If you don't receive a letter, please call our office to schedule the follow-up appointment.  Your physician recommends that you continue on your current medications as directed. Please refer to the Current Medication list given to you today.

## 2012-08-07 NOTE — Assessment & Plan Note (Signed)
Since last seen in our office the patient has lost 18 pounds through successful diuresis.  He is not having any significant dyspnea at rest or with activity now.

## 2012-08-07 NOTE — Assessment & Plan Note (Signed)
Blood pressure was remaining stable on current medication 

## 2012-08-07 NOTE — Assessment & Plan Note (Signed)
EKG continues to show evidence of bifascicular block.  His QTc interval is acceptable at 502 ms.  He has not had any dizziness or syncope

## 2012-08-29 ENCOUNTER — Telehealth: Payer: Self-pay | Admitting: Cardiology

## 2012-08-29 NOTE — Telephone Encounter (Signed)
New problem   Following up on a fax

## 2012-08-29 NOTE — Telephone Encounter (Signed)
Left [facility] a message to call back.

## 2012-09-18 ENCOUNTER — Telehealth: Payer: Self-pay | Admitting: Cardiology

## 2012-09-18 NOTE — Telephone Encounter (Signed)
New Problem  Calling about an order that was faxed on 07/18 about occupational therapy, was asking that it is signed, dated and returned/// not showing that it has been returned.   If she needs to fax again they will  Fax # [phone] Attn # Chip Boer

## 2012-09-19 ENCOUNTER — Other Ambulatory Visit: Payer: Self-pay

## 2012-09-19 MED ORDER — RANOLAZINE ER 500 MG PO TB12: 500.0000 mg | ORAL_TABLET | Freq: Two times a day (BID) | ORAL | Status: AC

## 2012-09-20 NOTE — Telephone Encounter (Signed)
Will check with medical records

## 2012-10-20 NOTE — Telephone Encounter (Signed)
Has been done! 

## 2012-10-31 ENCOUNTER — Ambulatory Visit (INDEPENDENT_AMBULATORY_CARE_PROVIDER_SITE_OTHER): Payer: Medicare Other | Admitting: Cardiology

## 2012-10-31 ENCOUNTER — Encounter: Payer: Self-pay | Admitting: Cardiology

## 2012-10-31 VITALS — BP 140/80 | HR 67 | Ht 64.0 in | Wt 210.0 lb

## 2012-10-31 DIAGNOSIS — K219 Gastro-esophageal reflux disease without esophagitis: Secondary | ICD-10-CM

## 2012-10-31 DIAGNOSIS — I509 Heart failure, unspecified: Secondary | ICD-10-CM

## 2012-10-31 DIAGNOSIS — I10 Essential (primary) hypertension: Secondary | ICD-10-CM

## 2012-10-31 DIAGNOSIS — I5032 Chronic diastolic (congestive) heart failure: Secondary | ICD-10-CM

## 2012-10-31 DIAGNOSIS — E230 Hypopituitarism: Secondary | ICD-10-CM

## 2012-10-31 NOTE — Assessment & Plan Note (Signed)
Patient is not having much in the way of reflux symptoms now that he has lost some weight

## 2012-10-31 NOTE — Assessment & Plan Note (Signed)
The patient has had a good response to Ranexa for his diastolic dysfunction.  He has not had any orthopnea or paroxysmal nocturnal dyspnea and his peripheral edema is much improved

## 2012-10-31 NOTE — Patient Instructions (Signed)
Your physician recommends that you continue on your current medications as directed. Please refer to the Current Medication list given to you today.  Your physician wants you to follow-up in: 3 month ov You will receive a reminder letter in the mail two months in advance. If you don't receive a letter, please call our office to schedule the follow-up appointment.     

## 2012-10-31 NOTE — Assessment & Plan Note (Signed)
Blood pressure was remaining stable on current therapy.  No dizziness or syncope. 

## 2012-10-31 NOTE — Progress Notes (Signed)
Scott Mooney Date of Birth:  07-24-1929 [address] [place], [place]  [address] [phone]         Fax   [phone]  History of Present Illness: This pleasant 79 year old gentleman is seen for a post hospital office visit. He was hospitalized from 05/15/2012 until 05/18/2012 with acute on chronic diastolic congestive heart failure. He has a history of compensated diastolic congestive heart failure.  Echocardiogram in February 2014 showed vigorous left ventricular systolic function with ejection fraction 65-70%.  There was grade 2 diastolic dysfunction.  Mitral valve showed systolic anterior motion and mild to moderate regurgitation and his pulmonary artery pressure was 36. The patient does not have any history of ischemic heart disease. He has not had cardiac catheterization. He did have a normal nuclear stress test at [facility] in 2000.  The patient was rehospitalized for exacerbation of CHF from 07/08/12 until 07/16/12.  He responded to high-dose Lasix and to the addition of Ranexa.  In the hospital he was quite confused.  Since going home his confusion has cleared.  Since going home he has been more careful with his diet and his weight is down 13 pounds. Last week he was walking downtown with his son.  He tripped on the sidewalk and fell and scraped up the right side of his forehead and also his right knee.  The abrasions appear to be healing well.  911 was called at the scene but the patient declined to be taken to the emergency room.   Current Outpatient Prescriptions  Medication Sig Dispense Refill  . aspirin EC 81 MG tablet Take 81 mg by mouth daily.      . Emollient (LUBRIDERM SERIOUSLY SENSITIVE) LOTN Apply 1 application topically 2 (two) times daily. Apply on his Lower extremities skin to keep it moist  437 mL  0  . feeding supplement (ENSURE COMPLETE) LIQD Take 237 mLs by mouth 2 (two) times daily between meals.      . furosemide (LASIX) 40 MG tablet Take 80  mg by mouth.      . hydrocortisone (CORTEF) 20 MG tablet Take 1 tablet (20 mg total) by mouth every morning.  30 tablet  1  . levothyroxine (SYNTHROID, LEVOTHROID) 125 MCG tablet Take 125 mcg by mouth every morning.      . multivitamin (THERAGRAN) per tablet Take 1 tablet by mouth daily.        . ondansetron (ZOFRAN) 4 MG tablet Take 4 mg by mouth daily.      . potassium chloride SA (K-DUR,KLOR-CON) 20 MEQ tablet Take 2 tablets (40 mEq total) by mouth daily.  60 tablet  3  . ranolazine (RANEXA) 500 MG 12 hr tablet Take 1 tablet (500 mg total) by mouth 2 (two) times daily.  60 tablet  1   No current facility-administered medications for this visit.    Allergies  Allergen Reactions  . Penicillins Swelling and Other (See Comments)    Head and feet swelling, spent 5 days in hospital as a result of taking med    Patient Active Problem List   Diagnosis Date Noted  . Shortness of breath 07/08/2012  . Unspecified hypothyroidism 07/08/2012  . Chronic diastolic heart failure 06/22/2012  . Essential hypertension 05/17/2012  . Bifascicular block 02/10/2011  . GERD (gastroesophageal reflux disease)     History  Smoking status  . Former Smoker  . Quit date: 01/11/1962  Smokeless tobacco  . Never Used    History  Alcohol Use No    Family History  Problem Relation Age of Onset  . Colon cancer Brother   . Cancer Sister     ?  . Stroke Father   . Skin cancer Mother     Review of Systems: Constitutional: no fever chills diaphoresis or fatigue or change in weight.  Head and neck: no hearing loss, no epistaxis, no photophobia or visual disturbance. Respiratory: No cough, shortness of breath or wheezing. Cardiovascular: No chest pain peripheral edema, palpitations. Gastrointestinal: No abdominal distention, no abdominal pain, no change in bowel habits hematochezia or melena. Genitourinary: No dysuria, no frequency, no urgency, no nocturia. Musculoskeletal:No arthralgias, no back  pain, no gait disturbance or myalgias. Neurological: No dizziness, no headaches, no numbness, no seizures, no syncope, no weakness, no tremors. Hematologic: No lymphadenopathy, no easy bruising. Psychiatric: No confusion, no hallucinations, no sleep disturbance.    Physical Exam: Filed Vitals:   10/31/12 1612  BP: 140/80  Pulse: 67   the general appearance reveals a well-developed well-nourished elderly gentleman in no distress.  There are healing abrasions of the right fore head and the right knee. He is much more alert and appropriate than he was in the hospital.The head and neck exam reveals pupils equal and reactive.  Extraocular movements are full.  There is no scleral icterus.  The mouth and pharynx are normal.  The neck is supple.  The carotids reveal no bruits.  The jugular venous pressure is normal.  The  thyroid is not enlarged.  There is no lymphadenopathy.  The chest is clear to percussion and auscultation.  There are no rales or rhonchi.  Expansion of the chest is symmetrical.  The precordium is quiet.  The first heart sound is normal.  The second heart sound is physiologically split.  There is no murmur gallop rub or click.  There is no abnormal lift or heave.  The abdomen is soft and nontender.  The bowel sounds are normal.  The liver and spleen are not enlarged.  There are no abdominal masses.  There are no abdominal bruits.  Extremities reveal good pedal pulses.  There is no phlebitis or edema.  There is no cyanosis or clubbing.  Strength is normal and symmetrical in all extremities.  There is no lateralizing weakness.  There are no sensory deficits.  The skin is warm and dry.  There is no rash.  EKG today shows normal sinus rhythm with bifascicular block.  Tracing is unchanged since 08/07/12   Assessment / Plan: The patient is doing well in terms of his chronic diastolic heart failure.  He is also doing well in terms of his goal of losing weight. Continue on same medication.   Recheck in 3 months for followup office visit and EKG.

## 2013-02-05 ENCOUNTER — Ambulatory Visit (INDEPENDENT_AMBULATORY_CARE_PROVIDER_SITE_OTHER): Payer: Medicare Other | Admitting: Cardiology

## 2013-02-05 ENCOUNTER — Encounter: Payer: Self-pay | Admitting: Cardiology

## 2013-02-05 VITALS — BP 130/70 | HR 67 | Ht 64.0 in | Wt 205.0 lb

## 2013-02-05 DIAGNOSIS — I5032 Chronic diastolic (congestive) heart failure: Secondary | ICD-10-CM

## 2013-02-05 DIAGNOSIS — I509 Heart failure, unspecified: Secondary | ICD-10-CM

## 2013-02-05 DIAGNOSIS — K219 Gastro-esophageal reflux disease without esophagitis: Secondary | ICD-10-CM

## 2013-02-05 DIAGNOSIS — I10 Essential (primary) hypertension: Secondary | ICD-10-CM

## 2013-02-05 DIAGNOSIS — I452 Bifascicular block: Secondary | ICD-10-CM

## 2013-02-05 NOTE — Progress Notes (Signed)
Scott Mooney Date of Birth:  1929-02-14 [address] [place], [place]  [address] [phone]         Fax   [phone]  History of Present Illness: This pleasant 80 year old gentleman is seen for a post hospital office visit. He was hospitalized from 05/15/2012 until 05/18/2012 with acute on chronic diastolic congestive heart failure. He has a history of compensated diastolic congestive heart failure.  Echocardiogram in February 2014 showed vigorous left ventricular systolic function with ejection fraction 65-70%.  There was grade 2 diastolic dysfunction.  Mitral valve showed systolic anterior motion and mild to moderate regurgitation and his pulmonary artery pressure was 36. The patient does not have any history of ischemic heart disease. He has not had cardiac catheterization. He did have a normal nuclear stress test at [facility] in 2000.  The patient was rehospitalized for exacerbation of CHF from 07/08/12 until 07/16/12.  He responded to high-dose Lasix and to the addition of Ranexa.  In the hospital he was quite confused.  Since going home his confusion has cleared.  Since going home he has been more careful with his diet and his weight is down to 205 pounds today.   Current Outpatient Prescriptions  Medication Sig Dispense Refill  . aspirin EC 81 MG tablet Take 81 mg by mouth daily.      . Emollient (LUBRIDERM SERIOUSLY SENSITIVE) LOTN Apply 1 application topically 2 (two) times daily. Apply on his Lower extremities skin to keep it moist  437 mL  0  . feeding supplement (ENSURE COMPLETE) LIQD Take 237 mLs by mouth 2 (two) times daily between meals.      . furosemide (LASIX) 40 MG tablet Take 80 mg by mouth.      . hydrocortisone (CORTEF) 20 MG tablet Take 1 tablet (20 mg total) by mouth every morning.  30 tablet  1  . levothyroxine (SYNTHROID, LEVOTHROID) 125 MCG tablet Take 125 mcg by mouth every morning.      . multivitamin (THERAGRAN) per tablet Take 1 tablet  by mouth daily.        . ondansetron (ZOFRAN) 4 MG tablet Take 4 mg by mouth daily.      . potassium chloride SA (K-DUR,KLOR-CON) 20 MEQ tablet Take 2 tablets (40 mEq total) by mouth daily.  60 tablet  3  . ranolazine (RANEXA) 500 MG 12 hr tablet Take 1 tablet (500 mg total) by mouth 2 (two) times daily.  60 tablet  1   No current facility-administered medications for this visit.    Allergies  Allergen Reactions  . Penicillins Swelling and Other (See Comments)    Head and feet swelling, spent 5 days in hospital as a result of taking med    Patient Active Problem List   Diagnosis Date Noted  . Shortness of breath 07/08/2012  . Unspecified hypothyroidism 07/08/2012  . Chronic diastolic heart failure 93/81/8299  . Essential hypertension 05/17/2012  . Bifascicular block 02/10/2011  . GERD (gastroesophageal reflux disease)     History  Smoking status  . Former Smoker  . Quit date: 01/11/1962  Smokeless tobacco  . Never Used    History  Alcohol Use No    Family History  Problem Relation Age of Onset  . Colon cancer Brother   . Cancer Sister     ?  . Stroke Father   . Skin cancer Mother     Review of Systems: Constitutional: no fever chills diaphoresis or fatigue or  change in weight.  Head and neck: no hearing loss, no epistaxis, no photophobia or visual disturbance. Respiratory: No cough, shortness of breath or wheezing. Cardiovascular: No chest pain peripheral edema, palpitations. Gastrointestinal: No abdominal distention, no abdominal pain, no change in bowel habits hematochezia or melena. Genitourinary: No dysuria, no frequency, no urgency, no nocturia. Musculoskeletal:No arthralgias, no back pain, no gait disturbance or myalgias. Neurological: No dizziness, no headaches, no numbness, no seizures, no syncope, no weakness, no tremors. Hematologic: No lymphadenopathy, no easy bruising. Psychiatric: No confusion, no hallucinations, no sleep  disturbance.    Physical Exam: Filed Vitals:   02/05/13 1146  BP: 130/70  Pulse:    the general appearance reveals a well-developed well-nourished elderly gentleman in no distress.  There are healing abrasions of the right fore head and the right knee. He is much more alert and appropriate than he was in the hospital.The head and neck exam reveals pupils equal and reactive.  Extraocular movements are full.  There is no scleral icterus.  The mouth and pharynx are normal.  The neck is supple.  The carotids reveal no bruits.  The jugular venous pressure is normal.  The  thyroid is not enlarged.  There is no lymphadenopathy.  The chest is clear to percussion and auscultation.  There are no rales or rhonchi.  Expansion of the chest is symmetrical.  The precordium is quiet.  The first heart sound is normal.  The second heart sound is physiologically split.  There is no murmur gallop rub or click.  There is no abnormal lift or heave.  The abdomen is soft and nontender.  The bowel sounds are normal.  The liver and spleen are not enlarged.  There are no abdominal masses.  There are no abdominal bruits.  Extremities reveal good pedal pulses.  There is no phlebitis or edema.  There is no cyanosis or clubbing.  Strength is normal and symmetrical in all extremities.  There is no lateralizing weakness.  There are no sensory deficits.  The skin is warm and dry.  There is no rash.  EKG today shows normal sinus rhythm with bifascicular block.  Tracing is unchanged since 10/31/12 except PVCs are now present.   Assessment / Plan: The patient is doing well in terms of his chronic diastolic heart failure.  He is also doing well in terms of his goal of losing weight.  [birth year]. Continue on same medication.  Recheck in 4 months for followup office visit and EKG.

## 2013-02-05 NOTE — Assessment & Plan Note (Signed)
He has not been having any evidence of recurrent worsening fluid retention.  His breathing has been good and he is not having any orthopnea or paroxysmal nocturnal dyspnea.  He appears to have had a good clinical response to the addition of Ranexa.

## 2013-02-05 NOTE — Assessment & Plan Note (Signed)
The patient has chronic bifascicular block.  He has not been aware of any slowing of his pulse he has not had any Stokes-Adams symptoms.

## 2013-02-05 NOTE — Assessment & Plan Note (Signed)
Blood pressure is labile.  On arrival at the office his blood pressure was elevated but on repeat by myself following the exam his blood pressure was stable at 130/70.

## 2013-02-05 NOTE — Patient Instructions (Signed)
Your physician recommends that you continue on your current medications as directed. Please refer to the Current Medication list given to you today.  Your physician wants you to follow-up in: 4 MONTH OV You will receive a reminder letter in the mail two months in advance. If you don't receive a letter, please call our office to schedule the follow-up appointment.  

## 2013-02-05 NOTE — Assessment & Plan Note (Signed)
With his recent intentional weight loss his symptoms of GERD have improved

## 2013-07-09 ENCOUNTER — Encounter (HOSPITAL_COMMUNITY): Payer: Self-pay | Admitting: Internal Medicine

## 2013-07-09 ENCOUNTER — Emergency Department (HOSPITAL_COMMUNITY)
Admission: EM | Admit: 2013-07-09 | Discharge: 2013-07-09 | Disposition: A | Discharge status: Home / Self Care | Payer: Medicare Other | Source: Home / Self Care | Attending: Emergency Medicine | Admitting: Emergency Medicine

## 2013-07-09 ENCOUNTER — Inpatient Hospital Stay (HOSPITAL_COMMUNITY): Payer: Medicare Other

## 2013-07-09 ENCOUNTER — Encounter (HOSPITAL_COMMUNITY): Payer: Self-pay | Admitting: Emergency Medicine

## 2013-07-09 ENCOUNTER — Inpatient Hospital Stay (HOSPITAL_COMMUNITY)
Admission: AD | Admit: 2013-07-09 | Discharge: 2013-07-10 | DRG: 315 | Disposition: A | Discharge status: Home / Self Care | Payer: Medicare Other | Source: Ambulatory Visit | Attending: Internal Medicine | Admitting: Internal Medicine

## 2013-07-09 DIAGNOSIS — I10 Essential (primary) hypertension: Secondary | ICD-10-CM

## 2013-07-09 DIAGNOSIS — I509 Heart failure, unspecified: Secondary | ICD-10-CM | POA: Diagnosis present

## 2013-07-09 DIAGNOSIS — R0602 Shortness of breath: Secondary | ICD-10-CM

## 2013-07-09 DIAGNOSIS — E039 Hypothyroidism, unspecified: Secondary | ICD-10-CM | POA: Diagnosis present

## 2013-07-09 DIAGNOSIS — E2749 Other adrenocortical insufficiency: Secondary | ICD-10-CM | POA: Diagnosis present

## 2013-07-09 DIAGNOSIS — R110 Nausea: Secondary | ICD-10-CM | POA: Diagnosis present

## 2013-07-09 DIAGNOSIS — Z7982 Long term (current) use of aspirin: Secondary | ICD-10-CM

## 2013-07-09 DIAGNOSIS — Z87891 Personal history of nicotine dependence: Secondary | ICD-10-CM

## 2013-07-09 DIAGNOSIS — N179 Acute kidney failure, unspecified: Secondary | ICD-10-CM | POA: Diagnosis present

## 2013-07-09 DIAGNOSIS — E230 Hypopituitarism: Secondary | ICD-10-CM | POA: Diagnosis present

## 2013-07-09 DIAGNOSIS — I2510 Atherosclerotic heart disease of native coronary artery without angina pectoris: Secondary | ICD-10-CM | POA: Diagnosis present

## 2013-07-09 DIAGNOSIS — E2740 Unspecified adrenocortical insufficiency: Secondary | ICD-10-CM | POA: Diagnosis present

## 2013-07-09 DIAGNOSIS — I951 Orthostatic hypotension: Secondary | ICD-10-CM

## 2013-07-09 DIAGNOSIS — I428 Other cardiomyopathies: Secondary | ICD-10-CM | POA: Diagnosis present

## 2013-07-09 DIAGNOSIS — I959 Hypotension, unspecified: Principal | ICD-10-CM | POA: Diagnosis present

## 2013-07-09 DIAGNOSIS — K219 Gastro-esophageal reflux disease without esophagitis: Secondary | ICD-10-CM

## 2013-07-09 DIAGNOSIS — R634 Abnormal weight loss: Secondary | ICD-10-CM | POA: Diagnosis present

## 2013-07-09 DIAGNOSIS — K7689 Other specified diseases of liver: Secondary | ICD-10-CM | POA: Diagnosis present

## 2013-07-09 DIAGNOSIS — I5032 Chronic diastolic (congestive) heart failure: Secondary | ICD-10-CM | POA: Diagnosis present

## 2013-07-09 DIAGNOSIS — I452 Bifascicular block: Secondary | ICD-10-CM

## 2013-07-09 HISTORY — DX: Shortness of breath: R06.02

## 2013-07-09 LAB — CBC
HEMATOCRIT: 55.9 % — AB (ref 39.0–52.0)
HEMOGLOBIN: 18.7 g/dL — AB (ref 13.0–17.0)
MCH: 25.7 pg — AB (ref 26.0–34.0)
MCHC: 33.5 g/dL (ref 30.0–36.0)
MCV: 76.9 fL — AB (ref 78.0–100.0)
Platelets: 138 10*3/uL — ABNORMAL LOW (ref 150–400)
RBC: 7.27 MIL/uL — ABNORMAL HIGH (ref 4.22–5.81)
RDW: 20.5 % — ABNORMAL HIGH (ref 11.5–15.5)
WBC: 8 10*3/uL (ref 4.0–10.5)

## 2013-07-09 LAB — COMPREHENSIVE METABOLIC PANEL
ALT: 21 U/L (ref 0–53)
AST: 51 U/L — ABNORMAL HIGH (ref 0–37)
Albumin: 3.6 g/dL (ref 3.5–5.2)
Alkaline Phosphatase: 101 U/L (ref 39–117)
BUN: 23 mg/dL (ref 6–23)
CALCIUM: 8.3 mg/dL — AB (ref 8.4–10.5)
CO2: 22 mEq/L (ref 19–32)
CREATININE: 1.64 mg/dL — AB (ref 0.50–1.35)
Chloride: 95 mEq/L — ABNORMAL LOW (ref 96–112)
GFR calc non Af Amer: 37 mL/min — ABNORMAL LOW (ref >90)
GFR, EST AFRICAN AMERICAN: 43 mL/min — AB (ref >90)
GLUCOSE: 133 mg/dL — AB (ref 70–99)
POTASSIUM: 4.1 meq/L (ref 3.7–5.3)
Sodium: 136 mEq/L — ABNORMAL LOW (ref 137–147)
TOTAL PROTEIN: 7.1 g/dL (ref 6.0–8.3)
Total Bilirubin: 2.5 mg/dL — ABNORMAL HIGH (ref 0.3–1.2)

## 2013-07-09 LAB — T4, FREE: FREE T4: 2.2 ng/dL — AB (ref 0.80–1.80)

## 2013-07-09 MED ORDER — OXYCODONE HCL 5 MG PO TABS
5.0000 mg | ORAL_TABLET | ORAL | Status: DC | PRN
Start: 2013-07-09 — End: 2013-07-10
  Administered 2013-07-09: 5 mg via ORAL
  Filled 2013-07-09 (×2): qty 1

## 2013-07-09 MED ORDER — ENOXAPARIN SODIUM 40 MG/0.4ML ~~LOC~~ SOLN
40.0000 mg | SUBCUTANEOUS | Status: DC
  Administered 2013-07-09: 40 mg via SUBCUTANEOUS
  Filled 2013-07-09 (×2): qty 0.4

## 2013-07-09 MED ORDER — ENSURE COMPLETE PO LIQD
237.0000 mL | Freq: Two times a day (BID) | ORAL | Status: DC
  Administered 2013-07-09 – 2013-07-10 (×2): 237 mL via ORAL

## 2013-07-09 MED ORDER — ASPIRIN EC 81 MG PO TBEC
81.0000 mg | DELAYED_RELEASE_TABLET | Freq: Every day | ORAL | Status: DC
  Filled 2013-07-09: qty 1

## 2013-07-09 MED ORDER — FLEET ENEMA 7-19 GM/118ML RE ENEM
1.0000 | ENEMA | Freq: Once | RECTAL | Status: AC | PRN
  Filled 2013-07-09: qty 1

## 2013-07-09 MED ORDER — ONDANSETRON HCL 4 MG PO TABS
4.0000 mg | ORAL_TABLET | Freq: Every day | ORAL | Status: DC
  Administered 2013-07-09 – 2013-07-10 (×2): 4 mg via ORAL
  Filled 2013-07-09 (×2): qty 1

## 2013-07-09 MED ORDER — LEVOTHYROXINE SODIUM 125 MCG PO TABS
125.0000 ug | ORAL_TABLET | Freq: Every morning | ORAL | Status: DC
  Administered 2013-07-09 – 2013-07-10 (×2): 125 ug via ORAL
  Filled 2013-07-09 (×2): qty 1

## 2013-07-09 MED ORDER — RANOLAZINE ER 500 MG PO TB12
500.0000 mg | ORAL_TABLET | Freq: Two times a day (BID) | ORAL | Status: DC
  Administered 2013-07-09 – 2013-07-10 (×2): 500 mg via ORAL
  Filled 2013-07-09 (×3): qty 1

## 2013-07-09 MED ORDER — ALUM & MAG HYDROXIDE-SIMETH 200-200-20 MG/5ML PO SUSP: 30.0000 mL | Freq: Four times a day (QID) | ORAL | Status: DC | PRN

## 2013-07-09 MED ORDER — ACETAMINOPHEN 325 MG PO TABS
650.0000 mg | ORAL_TABLET | Freq: Four times a day (QID) | ORAL | Status: DC | PRN
Start: 2013-07-09 — End: 2013-07-10
  Administered 2013-07-09: 650 mg via ORAL
  Filled 2013-07-09: qty 2

## 2013-07-09 MED ORDER — LUBRIDERM SERIOUSLY SENSITIVE EX LOTN
1.0000 "application " | TOPICAL_LOTION | Freq: Two times a day (BID) | CUTANEOUS | Status: DC
  Filled 2013-07-09 (×2): qty 473

## 2013-07-09 MED ORDER — ACETAMINOPHEN 650 MG RE SUPP: 650.0000 mg | Freq: Four times a day (QID) | RECTAL | Status: DC | PRN

## 2013-07-09 MED ORDER — ADULT MULTIVITAMIN W/MINERALS CH
1.0000 | ORAL_TABLET | Freq: Every day | ORAL | Status: DC
  Administered 2013-07-10: 1 via ORAL
  Filled 2013-07-09: qty 1

## 2013-07-09 MED ORDER — POTASSIUM CHLORIDE IN NACL 20-0.9 MEQ/L-% IV SOLN
INTRAVENOUS | Status: DC
  Administered 2013-07-09 – 2013-07-10 (×2): via INTRAVENOUS
  Filled 2013-07-09 (×4): qty 1000

## 2013-07-09 MED ORDER — HYDROCORTISONE NA SUCCINATE PF 100 MG IJ SOLR
100.0000 mg | Freq: Three times a day (TID) | INTRAMUSCULAR | Status: DC
  Administered 2013-07-09 – 2013-07-10 (×3): 100 mg via INTRAVENOUS
  Filled 2013-07-09 (×6): qty 2

## 2013-07-09 MED ORDER — ASPIRIN EC 81 MG PO TBEC
81.0000 mg | DELAYED_RELEASE_TABLET | Freq: Every day | ORAL | Status: DC
  Administered 2013-07-09 – 2013-07-10 (×2): 81 mg via ORAL
  Filled 2013-07-09 (×2): qty 1

## 2013-07-09 NOTE — Progress Notes (Signed)
Pt admitted to [phone]. Pt arrived via stretcher from the ED with family members including wife. Pt in no apparent discomfort or distress. Pt alert and oriented x4. Patient is a direct admit from Dr. Shon Baton office. Pt did go to ED and was triaged in the ED with and EKG and patient registration completed. Dr. Shon Baton office notified of patient's arrival. Spoke with DJ who stated she would notify Dr. Philip Aspen and have him place orders. Pt with wife at bedside. Pt oriented to room and bed, call light and phone within reach. Bed low and locked, side rails up x2, bed alarm on.  Will await further orders and continue to monitor.  - Roselyn Reef Sharda Keddy,RN

## 2013-07-09 NOTE — H&P (Signed)
YAQUB ARNEY is an 80 y.o. male.   Chief Complaint: nausea and dizziness HPI:  The patient is an 80 year old man with several medical problems who presented to our office today after a few days of nausea, vomiting, diarrhea, and very little food or fluid intake associated with dizziness. He was unable to walk because of dizziness with standing upright. He has not had fever, chills, shortness of breath, chest pain, palpitations, or abdominal pain. In the past he has had episodes of nausea with decreased food intake that corresponded to missed doses of hydrocortisone that he takes for adrenal insufficiency. He has also had a workup in the past for similar symptoms that included an upper GI with small bowel follow-through study that was normal. He has had some unintentional weight loss over the past year associated with decreased dosing of his hydrocortisone. In our office he was here a week with a blood pressure of 88/54, and he is unable to stand up, so he was admitted for evaluation. Past Medical History  Diagnosis Date  . Diastolic dysfunction   . History of pituitary tumor   . Pituitary insufficiency   . Hypothyroidism   . GERD (gastroesophageal reflux disease)   . Splenic flexure syndrome   . CHF (congestive heart failure)     with diastolic dysfunction  . Hiatal hernia   . Coronary artery disease   . Cardiomyopathy   . Diverticulitis     recurrent  . Nephrolithiasis     right-sided  . Fatty liver   . Gout   . Arthritis   . Exogenous obesity   . Personal history of colonic polyps 07/23/2008    TUBULAR ADENOMA  . Ureteral stone     distal ureteral stone   . Thrombocytopenia   . Shortness of breath     Medications Prior to Admission  Medication Sig Dispense Refill  . aspirin EC 81 MG tablet Take 81 mg by mouth daily.      . Emollient (LUBRIDERM) LOTN Apply 1 application topically daily.      . furosemide (LASIX) 40 MG tablet Take 80 mg by mouth daily.       . hydrocortisone  (CORTEF) 20 MG tablet Take 1 tablet (20 mg total) by mouth every morning.  30 tablet  1  . levothyroxine (SYNTHROID, LEVOTHROID) 125 MCG tablet Take 125 mcg by mouth every morning.      . multivitamin (THERAGRAN) per tablet Take 1 tablet by mouth daily.        . potassium chloride SA (K-DUR,KLOR-CON) 20 MEQ tablet Take 2 tablets (40 mEq total) by mouth daily.  60 tablet  3  . PRESCRIPTION MEDICATION Place 1 drop into both eyes daily as needed (for dry or itchy eyes).      . ranolazine (RANEXA) 500 MG 12 hr tablet Take 1 tablet (500 mg total) by mouth 2 (two) times daily.  60 tablet  1    ADDITIONAL HOME MEDICATIONS: No additional home medications  PHYSICIANS INVOLVED IN CARE: , [place], Clint young, Verl Blalock, Dr. Hassell Done at [facility] ophthalmology, Leanna Battles (primary care)  Past Surgical History  Procedure Laterality Date  . Transphenoidal pituitary resection  2001  . Cholecystectomy, laparoscopic  2004  . Esophageal dilation  12/2006    endoscopy with dilatation of esophageal stricture    Family History  Problem Relation Age of Onset  . Colon cancer Brother   . Cancer Sister     ?  . Stroke Father   .  Skin cancer Mother      Social History:  reports that he quit smoking about 51 years ago. He has never used smokeless tobacco. He reports that he does not drink alcohol or use illicit drugs.  Allergies:  Allergies  Allergen Reactions  . Penicillins Swelling and Other (See Comments)    Head and feet swelling, spent 5 days in hospital as a result of taking med     ROS: ankle swelling, arthritis, high blood pressure, kidney disease, shortness of breath, weight loss and Panhypopituitarism, chronic kidney disease, stage III, right-sided nephrolithiasis, osteoarthritis, gout, fatty liver, osteoporosis,  PHYSICAL EXAM: Blood pressure 138/67, pulse 97, temperature 98.3 F (36.8 C), temperature source Oral, resp. rate 22, SpO2 93.00%. In general, he is a  mildly overweight white man who looks fatigued while sitting in a chair. HEENT exam was within normal limits, neck was supple without jugular venous distention or carotid bruit, chest was clear to auscultation, heart had a regular rate and rhythm without significant murmur or gallop, abdomen had normal bowel sounds and no hepatosplenomegaly or tenderness, extremities were significant for bilateral 2+ leg edema and bilateral 1+ pedal pulses. He was alert and well oriented with normal affect and could move all extremities well. Cerebellar function and gait were not assessed.  Results for orders placed during the hospital encounter of 07/09/13 (from the past 48 hour(s))  CBC     Status: Abnormal   Collection Time    07/09/13  2:53 PM      Result Value Ref Range   WBC 8.0  4.0 - 10.5 K/uL   RBC 7.27 (*) 4.22 - 5.81 MIL/uL   Hemoglobin 18.7 (*) 13.0 - 17.0 g/dL   HCT 55.9 (*) 39.0 - 52.0 %   MCV 76.9 (*) 78.0 - 100.0 fL   MCH 25.7 (*) 26.0 - 34.0 pg   MCHC 33.5  30.0 - 36.0 g/dL   RDW 20.5 (*) 11.5 - 15.5 %   Platelets 138 (*) 150 - 400 K/uL  COMPREHENSIVE METABOLIC PANEL     Status: Abnormal   Collection Time    07/09/13  2:53 PM      Result Value Ref Range   Sodium 136 (*) 137 - 147 mEq/L   Potassium 4.1  3.7 - 5.3 mEq/L   Comment: HEMOLYSIS AT THIS LEVEL MAY AFFECT RESULT   Chloride 95 (*) 96 - 112 mEq/L   CO2 22  19 - 32 mEq/L   Glucose, Bld 133 (*) 70 - 99 mg/dL   BUN 23  6 - 23 mg/dL   Creatinine, Ser 1.64 (*) 0.50 - 1.35 mg/dL   Calcium 8.3 (*) 8.4 - 10.5 mg/dL   Total Protein 7.1  6.0 - 8.3 g/dL   Albumin 3.6  3.5 - 5.2 g/dL   AST 51 (*) 0 - 37 U/L   Comment: HEMOLYSIS AT THIS LEVEL MAY AFFECT RESULT   ALT 21  0 - 53 U/L   Comment: HEMOLYSIS AT THIS LEVEL MAY AFFECT RESULT   Alkaline Phosphatase 101  39 - 117 U/L   Total Bilirubin 2.5 (*) 0.3 - 1.2 mg/dL   GFR calc non Af Amer 37 (*) >90 mL/min   GFR calc Af Amer 43 (*) >90 mL/min   Comment: (NOTE)     The eGFR has been  calculated using the CKD EPI equation.     This calculation has not been validated in all clinical situations.     eGFR's persistently <90 mL/min signify possible  Chronic Kidney     Disease.   Acute Abdominal Series  07/09/2013   CLINICAL DATA:  Abdomen pain and nausea  EXAM: ACUTE ABDOMEN SERIES (ABDOMEN 2 VIEW & CHEST 1 VIEW)  COMPARISON:  Chest x-ray July 14, 2012  FINDINGS: There is no evidence of dilated bowel loops or free intraperitoneal air. No radiopaque calculi or other significant radiographic abnormality is seen. Prior cholecystectomy clips are noted. Heart size and mediastinal contours are stable. There is chronic scar of the right lung base unchanged. There is minimal right pleural effusion versus right pleural thickening.  IMPRESSION: Negative abdominal radiographs.  No acute cardiopulmonary disease.   Electronically Signed   By: Abelardo Diesel M.D.   On: 07/09/2013 16:44     Assessment/Plan #1 Hypotension:  Most likely from decreased food and fluid intake, but certainly could be secondary to missed doses of hydrocortisone. He'll be administered stress doses of hydrocortisone and IV fluid rehydration. Labs indicate some degree of dehydration. #2 Nausea: unclear cause and could be from adrenal insufficiency. Abdomen x-rays are unremarkable. If his symptoms do not resolve with stress dose corticosteroids than we will consider obtaining a CT scan of the abdomen and pelvis or upper GI series with small bowel follow-through.  #3 Weight Loss:Most likely from reduced dosing of hydrocortisone and less likely from an occult neoplasm. We will check his free T4 level as TSH is not reliable with pituitary gland resection.  #4 Panhypopituitarism:  symptoms appear increased. We will check a random serum cortisol level as well as a free T4 level.   PATERSON,DANIEL G 07/09/2013, 7:05 PM

## 2013-07-09 NOTE — ED Notes (Signed)
Pt is a direct admit and no orders to be placed or done in the ED.  Pt transported to [facility].

## 2013-07-09 NOTE — ED Notes (Signed)
Pt has not been feeling well for a couple of days.  Swelling to LE and abdomen, sob.  Pt reports lower back pain.  Pt has history of chf and sent here for chf findings.  Pt appears weak, reports not eating

## 2013-07-10 LAB — CBC
HEMATOCRIT: 53.4 % — AB (ref 39.0–52.0)
Hemoglobin: 17.7 g/dL — ABNORMAL HIGH (ref 13.0–17.0)
MCH: 25.9 pg — ABNORMAL LOW (ref 26.0–34.0)
MCHC: 33.1 g/dL (ref 30.0–36.0)
MCV: 78.1 fL (ref 78.0–100.0)
Platelets: 92 10*3/uL — ABNORMAL LOW (ref 150–400)
RBC: 6.84 MIL/uL — ABNORMAL HIGH (ref 4.22–5.81)
RDW: 20.2 % — AB (ref 11.5–15.5)
WBC: 6.5 10*3/uL (ref 4.0–10.5)

## 2013-07-10 LAB — BASIC METABOLIC PANEL
BUN: 22 mg/dL (ref 6–23)
CHLORIDE: 102 meq/L (ref 96–112)
CO2: 21 mEq/L (ref 19–32)
CREATININE: 1.22 mg/dL (ref 0.50–1.35)
Calcium: 8.3 mg/dL — ABNORMAL LOW (ref 8.4–10.5)
GFR calc non Af Amer: 53 mL/min — ABNORMAL LOW (ref >90)
GFR, EST AFRICAN AMERICAN: 62 mL/min — AB (ref >90)
Glucose, Bld: 142 mg/dL — ABNORMAL HIGH (ref 70–99)
Potassium: 5 mEq/L (ref 3.7–5.3)
SODIUM: 138 meq/L (ref 137–147)

## 2013-07-10 MED ORDER — LUBRIDERM SERIOUSLY SENSITIVE EX LOTN: 1.0000 "application " | TOPICAL_LOTION | Freq: Two times a day (BID) | CUTANEOUS | Status: DC

## 2013-07-10 MED ORDER — ONDANSETRON HCL 4 MG PO TABS: 4.0000 mg | ORAL_TABLET | Freq: Every day | ORAL | Status: AC

## 2013-07-10 MED ORDER — ENSURE COMPLETE PO LIQD: 237.0000 mL | Freq: Two times a day (BID) | ORAL | Status: AC

## 2013-07-10 MED ORDER — HYDROCORTISONE 20 MG PO TABS: ORAL_TABLET | ORAL | Status: DC

## 2013-07-10 NOTE — Discharge Summary (Signed)
Physician Discharge Summary  Patient ID: Scott Mooney MRN: [id number] DOB/AGE: [id number] y.o.  Admit date: 07/09/2013 Discharge date: 07/10/2013   Discharge Diagnoses:  Active Problems:   Hypotension, unspecified   Nausea alone   Adrenal insufficiency   Unspecified hypothyroidism   Hypotension   Discharged Condition: good  Hospital Course: The patient is an 80 year old man with several medical problems who presented to our office today after a few days of nausea, vomiting, diarrhea, and very little food or fluid intake associated with dizziness. He was unable to walk because of dizziness with standing upright. He has not had fever, chills, shortness of breath, chest pain, palpitations, or abdominal pain. In the past he has had episodes of nausea with decreased food intake that corresponded to missed doses of hydrocortisone that he takes for adrenal insufficiency. He has also had a workup in the past for similar symptoms that included an upper GI with small bowel follow-through study that was normal. He has had some unintentional weight loss over the past year associated with decreased dosing of his hydrocortisone. In our office he was here a week with a blood pressure of 88/54, and he is unable to stand up, so he was admitted for evaluation. His nephew, who generally had been preparing meds for the patient and his wife, had not been doing so recently as Scott Mooney felt that he could do so himself.  He was admitted to a general medical bed and given stress dose corticosteroids and IV fluids. He did very well with this with a rapid normalization of blood pressure and resolution of nausea. By the morning after admission he felt back to his baseline with no nausea or dizziness and normal vital signs. Labs that had shown acute kidney injury had normalized with improvement in blood pressure. There were no procedures done and no complications.   Consults: None  Significant Diagnostic Studies:   Acute Abdominal Series  07/09/2013   CLINICAL DATA:  Abdomen pain and nausea  EXAM: ACUTE ABDOMEN SERIES (ABDOMEN 2 VIEW & CHEST 1 VIEW)  COMPARISON:  Chest x-ray July 14, 2012  FINDINGS: There is no evidence of dilated bowel loops or free intraperitoneal air. No radiopaque calculi or other significant radiographic abnormality is seen. Prior cholecystectomy clips are noted. Heart size and mediastinal contours are stable. There is chronic scar of the right lung base unchanged. There is minimal right pleural effusion versus right pleural thickening.  IMPRESSION: Negative abdominal radiographs.  No acute cardiopulmonary disease.   Electronically Signed   By: Abelardo Diesel M.D.   On: 07/09/2013 16:44    Labs: Lab Results  Component Value Date   WBC 6.5 07/10/2013   HGB 17.7* 07/10/2013   HCT 53.4* 07/10/2013   MCV 78.1 07/10/2013   PLT 92* 07/10/2013     Recent Labs Lab 07/09/13 1453 07/10/13 0544  NA 136* 138  K 4.1 5.0  CL 95* 102  CO2 22 21  BUN 23 22  CREATININE 1.64* 1.22  CALCIUM 8.3* 8.3*  PROT 7.1  --   BILITOT 2.5*  --   ALKPHOS 101  --   ALT 21  --   AST 51*  --   GLUCOSE 133* 142*    Free T4 level was 2.20 (0.8-1.80)   No results found for this basename: INR, PROTIME     No results found for this or any previous visit (from the past 240 hour(s)).    Discharge Exam: Blood pressure 105/65, pulse 70, temperature  97.5 F (36.4 C), temperature source Oral, resp. rate 18, height 5\' 4"  (1.626 m), weight 92.6 kg (204 lb 2.3 oz), SpO2 92.00%.  Physical Exam: In general he is a mildly overweight white man who was in no apparent distress. HEENT exam was normal, neck had no JVD, chest was clear to auscultation, heart had a regular rate and rhythm, abdomen had normal bowel sounds and no tenderness, and extremities had 2+ bilateral leg edema. He was alert and well oriented and could move all extremities well.   Disposition:  He will be discharged to home in the company of family  members.   Discharge Instructions   Call MD for:    Complete by:  As directed   Call for fever, chills, dizziness, fatigue, difficulty breathing, nausea, diarrhea, or other concerning symptoms     Diet - low sodium heart healthy    Complete by:  As directed   Regular diet     Discharge instructions    Complete by:  As directed   Check body weights every day and bring results to your next office visit.     Increase activity slowly    Complete by:  As directed             Medication List         aspirin EC 81 MG tablet  Take 81 mg by mouth daily.     feeding supplement (ENSURE COMPLETE) Liqd  Take 237 mLs by mouth 2 (two) times daily between meals.     furosemide 40 MG tablet  Commonly known as:  LASIX  Take 80 mg by mouth daily.     hydrocortisone 20 MG tablet  Commonly known as:  CORTEF  Take 1 tablet by mouth twice daily for 7 days, then 1 tablet every morning     levothyroxine 125 MCG tablet  Commonly known as:  SYNTHROID, LEVOTHROID  Take 125 mcg by mouth every morning.     LUBRIDERM Lotn  Apply 1 application topically daily.     lubriderm seriously sensitive Lotn  Apply 1 application topically 2 (two) times daily. Apply on his Lower extremities skin to keep it moist     multivitamin per tablet  Take 1 tablet by mouth daily.     ondansetron 4 MG tablet  Commonly known as:  ZOFRAN  Take 1 tablet (4 mg total) by mouth daily.     potassium chloride SA 20 MEQ tablet  Commonly known as:  K-DUR,KLOR-CON  Take 2 tablets (40 mEq total) by mouth daily.     PRESCRIPTION MEDICATION  Place 1 drop into both eyes daily as needed (for dry or itchy eyes).     ranolazine 500 MG 12 hr tablet  Commonly known as:  RANEXA  Take 1 tablet (500 mg total) by mouth 2 (two) times daily.           Follow-up Information   Follow up with Donnajean Lopes, MD. Schedule an appointment as soon as possible for a visit in 1 week.   Specialty:  Internal Medicine   Contact  information:   [place] [address] [phone]       Signed: Donnajean Lopes 07/10/2013, 10:12 AM

## 2013-07-12 ENCOUNTER — Other Ambulatory Visit: Payer: Self-pay | Admitting: *Deleted

## 2013-07-12 MED ORDER — FUROSEMIDE 40 MG PO TABS: 80.0000 mg | ORAL_TABLET | Freq: Every day | ORAL | Status: DC

## 2013-07-19 ENCOUNTER — Encounter: Payer: Self-pay | Admitting: Cardiology

## 2013-07-19 ENCOUNTER — Ambulatory Visit (INDEPENDENT_AMBULATORY_CARE_PROVIDER_SITE_OTHER): Payer: Medicare Other | Admitting: Cardiology

## 2013-07-19 VITALS — BP 138/71 | HR 70 | Ht 64.0 in | Wt 198.0 lb

## 2013-07-19 DIAGNOSIS — I452 Bifascicular block: Secondary | ICD-10-CM

## 2013-07-19 DIAGNOSIS — R0602 Shortness of breath: Secondary | ICD-10-CM

## 2013-07-19 DIAGNOSIS — I509 Heart failure, unspecified: Secondary | ICD-10-CM

## 2013-07-19 DIAGNOSIS — E230 Hypopituitarism: Secondary | ICD-10-CM

## 2013-07-19 DIAGNOSIS — I5032 Chronic diastolic (congestive) heart failure: Secondary | ICD-10-CM

## 2013-07-19 NOTE — Progress Notes (Signed)
Scott Mooney Date of Birth:  08-30-1929 [place] [address] [place], [place]  [address] [phone]        Fax   [phone]   History of Present Illness:  This pleasant 80 year old gentleman is seen for a scheduled followup office visit. He was hospitalized from 05/15/2012 until 05/18/2012 with acute on chronic diastolic congestive heart failure. He has a history of compensated diastolic congestive heart failure. Echocardiogram in February 2014 showed vigorous left ventricular systolic function with ejection fraction 65-70%. There was grade 2 diastolic dysfunction. Mitral valve showed systolic anterior motion and mild to moderate regurgitation and his pulmonary artery pressure was 36. The patient does not have any history of ischemic heart disease. He has not had cardiac catheterization. He did have a normal nuclear stress test at [facility] in 2000. The patient was rehospitalized for exacerbation of CHF from 07/08/12 until 07/16/12. He responded to high-dose Lasix and to the addition of Ranexa. In the hospital he was quite confused. Since going home his confusion has cleared.  Since we last saw him the patient was hospitalized overnight on 6/29-6/30/15 for low blood pressure.  He responded to increased steroids and IV fluids.   Current Outpatient Prescriptions  Medication Sig Dispense Refill  . aspirin EC 81 MG tablet Take 81 mg by mouth daily.      . Emollient (LUBRIDERM SERIOUSLY SENSITIVE) LOTN Apply 1 application topically 2 (two) times daily. Apply on his Lower extremities skin to keep it moist  437 mL  0  . Emollient (LUBRIDERM) LOTN Apply 1 application topically daily.      . feeding supplement, ENSURE COMPLETE, (ENSURE COMPLETE) LIQD Take 237 mLs by mouth 2 (two) times daily between meals.      . furosemide (LASIX) 40 MG tablet Take 2 tablets (80 mg total) by mouth daily.  60 tablet  0  . hydrocortisone (CORTEF) 20 MG tablet Take 1 tablet by mouth  twice daily for 7 days, then 1 tablet every morning  60 tablet  12  . levothyroxine (SYNTHROID, LEVOTHROID) 125 MCG tablet Take 125 mcg by mouth every morning.      . multivitamin (THERAGRAN) per tablet Take 1 tablet by mouth daily.        . ondansetron (ZOFRAN) 4 MG tablet Take 1 tablet (4 mg total) by mouth daily.  20 tablet  0  . potassium chloride SA (K-DUR,KLOR-CON) 20 MEQ tablet Take 2 tablets (40 mEq total) by mouth daily.  60 tablet  3  . PRESCRIPTION MEDICATION Place 1 drop into both eyes daily as needed (for dry or itchy eyes).      . ranolazine (RANEXA) 500 MG 12 hr tablet Take 1 tablet (500 mg total) by mouth 2 (two) times daily.  60 tablet  1   No current facility-administered medications for this visit.    Allergies  Allergen Reactions  . Penicillins Swelling and Other (See Comments)    Head and feet swelling, spent 5 days in hospital as a result of taking med    Patient Active Problem List   Diagnosis Date Noted  . Hypotension, unspecified 07/09/2013    Class: Acute  . Nausea alone 07/09/2013    Class: Acute  . Adrenal insufficiency 07/09/2013    Class: Chronic  . Hypotension 07/09/2013  . Shortness of breath 07/08/2012  . Unspecified hypothyroidism 07/08/2012  . Chronic diastolic heart failure 77/82/4235  . Essential hypertension 05/17/2012  . Bifascicular block 02/10/2011  .  GERD (gastroesophageal reflux disease)     History  Smoking status  . Former Smoker  . Quit date: 01/11/1962  Smokeless tobacco  . Never Used    History  Alcohol Use No    Family History  Problem Relation Age of Onset  . Colon cancer Brother   . Cancer Sister     ?  . Stroke Father   . Skin cancer Mother     Review of Systems: Constitutional: no fever chills diaphoresis or fatigue or change in weight.  Head and neck: no hearing loss, no epistaxis, no photophobia or visual disturbance. Respiratory: No cough, shortness of breath or wheezing. Cardiovascular: No chest pain  peripheral edema, palpitations. Gastrointestinal: No abdominal distention, no abdominal pain, no change in bowel habits hematochezia or melena. Genitourinary: No dysuria, no frequency, no urgency, no nocturia. Musculoskeletal:No arthralgias, no back pain, no gait disturbance or myalgias. Neurological: No dizziness, no headaches, no numbness, no seizures, no syncope, no weakness, no tremors. Hematologic: No lymphadenopathy, no easy bruising. Psychiatric: No confusion, no hallucinations, no sleep disturbance.    Physical Exam: Filed Vitals:   07/19/13 1332  BP: 138/71  Pulse: 70   the general appearance reveals a well-developed well-nourished elderly gentleman in no distress.The head and neck exam reveals pupils equal and reactive.  Extraocular movements are full.  There is no scleral icterus.  The mouth and pharynx are normal.  The neck is supple.  The carotids reveal no bruits.  The jugular venous pressure is normal.  The  thyroid is not enlarged.  There is no lymphadenopathy.   Chest reveals mild expiratory rhonchi which clear with coughing.  Expansion of the chest is symmetrical.  The precordium is quiet.  The first heart sound is normal.  The second heart sound is physiologically split.  There is no murmur gallop rub or click.  There is no abnormal lift or heave.  The abdomen is soft and nontender.  The bowel sounds are normal.  The liver and spleen are not enlarged.  There are no abdominal masses.  There are no abdominal bruits.  Extremities reveal good pedal pulses.  There is mild chronic lower extremity edema.  There is no cyanosis or clubbing.  Strength is normal and symmetrical in all extremities.  There is no lateralizing weakness.  There are no sensory deficits.  The skin is warm and dry.  There is no rash.    Assessment / Plan: 1. chronic diastolic heart failure 2. 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 mild-to-moderate mitral regurgitation  Plan: Continue  current medication.  Recheck in 6 months for office visit and EKG

## 2013-07-19 NOTE — Assessment & Plan Note (Signed)
The patient is not having any symptoms of severe dizziness or syncope related to his bifascicular block.  His previous weakness secondary to transient hypotension responded to steroids and IV fluids.

## 2013-07-19 NOTE — Assessment & Plan Note (Signed)
The patient is currently being treated for a respiratory infection.  He is on doxycycline 100 mg twice a day.  He has a a nonproductive cough.  Has had some nausea and then Zofran on hand.

## 2013-07-19 NOTE — Assessment & Plan Note (Signed)
The patient has not been having any symptoms to suggest worsening diastolic dysfunction.  His weight is down 7 pounds since last visit.

## 2013-07-19 NOTE — Patient Instructions (Signed)
Your physician recommends that you continue on your current medications as directed. Please refer to the Current Medication list given to you today.  Your physician wants you to follow-up in: 6 month ov/ekg You will receive a reminder letter in the mail two months in advance. If you don't receive a letter, please call our office to schedule the follow-up appointment.  

## 2013-09-10 ENCOUNTER — Encounter: Payer: Self-pay | Admitting: Internal Medicine

## 2013-09-24 ENCOUNTER — Other Ambulatory Visit: Payer: Self-pay | Admitting: Interventional Cardiology

## 2014-05-01 ENCOUNTER — Other Ambulatory Visit: Payer: Self-pay | Admitting: *Deleted

## 2014-05-01 MED ORDER — FUROSEMIDE 40 MG PO TABS: 80.0000 mg | ORAL_TABLET | Freq: Every day | ORAL | Status: AC

## 2014-05-19 ENCOUNTER — Observation Stay (HOSPITAL_COMMUNITY): Payer: PPO

## 2014-05-19 ENCOUNTER — Emergency Department (HOSPITAL_COMMUNITY): Payer: PPO

## 2014-05-19 ENCOUNTER — Encounter (HOSPITAL_COMMUNITY): Payer: Self-pay | Admitting: Emergency Medicine

## 2014-05-19 ENCOUNTER — Observation Stay (HOSPITAL_COMMUNITY)
Admission: EM | Admit: 2014-05-19 | Discharge: 2014-05-23 | Disposition: A | Discharge status: Home / Self Care | Payer: PPO | Attending: Internal Medicine | Admitting: Internal Medicine

## 2014-05-19 DIAGNOSIS — S32048A Other fracture of fourth lumbar vertebra, initial encounter for closed fracture: Principal | ICD-10-CM | POA: Insufficient documentation

## 2014-05-19 DIAGNOSIS — K5730 Diverticulosis of large intestine without perforation or abscess without bleeding: Secondary | ICD-10-CM | POA: Insufficient documentation

## 2014-05-19 DIAGNOSIS — M545 Low back pain, unspecified: Secondary | ICD-10-CM | POA: Diagnosis present

## 2014-05-19 DIAGNOSIS — M109 Gout, unspecified: Secondary | ICD-10-CM | POA: Diagnosis not present

## 2014-05-19 DIAGNOSIS — Z9049 Acquired absence of other specified parts of digestive tract: Secondary | ICD-10-CM | POA: Diagnosis not present

## 2014-05-19 DIAGNOSIS — D696 Thrombocytopenia, unspecified: Secondary | ICD-10-CM | POA: Diagnosis not present

## 2014-05-19 DIAGNOSIS — K7460 Unspecified cirrhosis of liver: Secondary | ICD-10-CM | POA: Diagnosis not present

## 2014-05-19 DIAGNOSIS — K219 Gastro-esophageal reflux disease without esophagitis: Secondary | ICD-10-CM | POA: Insufficient documentation

## 2014-05-19 DIAGNOSIS — N281 Cyst of kidney, acquired: Secondary | ICD-10-CM | POA: Diagnosis not present

## 2014-05-19 DIAGNOSIS — Z01811 Encounter for preprocedural respiratory examination: Secondary | ICD-10-CM

## 2014-05-19 DIAGNOSIS — E039 Hypothyroidism, unspecified: Secondary | ICD-10-CM | POA: Insufficient documentation

## 2014-05-19 DIAGNOSIS — K766 Portal hypertension: Secondary | ICD-10-CM | POA: Insufficient documentation

## 2014-05-19 DIAGNOSIS — IMO0002 Reserved for concepts with insufficient information to code with codable children: Secondary | ICD-10-CM

## 2014-05-19 DIAGNOSIS — R52 Pain, unspecified: Secondary | ICD-10-CM

## 2014-05-19 DIAGNOSIS — I2510 Atherosclerotic heart disease of native coronary artery without angina pectoris: Secondary | ICD-10-CM | POA: Insufficient documentation

## 2014-05-19 DIAGNOSIS — X58XXXA Exposure to other specified factors, initial encounter: Secondary | ICD-10-CM | POA: Diagnosis not present

## 2014-05-19 DIAGNOSIS — E2740 Unspecified adrenocortical insufficiency: Secondary | ICD-10-CM | POA: Diagnosis not present

## 2014-05-19 DIAGNOSIS — Z86010 Personal history of colonic polyps: Secondary | ICD-10-CM | POA: Diagnosis not present

## 2014-05-19 DIAGNOSIS — Z87442 Personal history of urinary calculi: Secondary | ICD-10-CM | POA: Diagnosis not present

## 2014-05-19 DIAGNOSIS — Z880 Allergy status to penicillin: Secondary | ICD-10-CM | POA: Diagnosis not present

## 2014-05-19 DIAGNOSIS — M4854XA Collapsed vertebra, not elsewhere classified, thoracic region, initial encounter for fracture: Secondary | ICD-10-CM | POA: Insufficient documentation

## 2014-05-19 DIAGNOSIS — Z87891 Personal history of nicotine dependence: Secondary | ICD-10-CM | POA: Diagnosis not present

## 2014-05-19 DIAGNOSIS — M8448XA Pathological fracture, other site, initial encounter for fracture: Secondary | ICD-10-CM | POA: Diagnosis not present

## 2014-05-19 DIAGNOSIS — Z7982 Long term (current) use of aspirin: Secondary | ICD-10-CM | POA: Diagnosis not present

## 2014-05-19 DIAGNOSIS — I5032 Chronic diastolic (congestive) heart failure: Secondary | ICD-10-CM | POA: Diagnosis present

## 2014-05-19 DIAGNOSIS — R262 Difficulty in walking, not elsewhere classified: Secondary | ICD-10-CM | POA: Diagnosis present

## 2014-05-19 DIAGNOSIS — K449 Diaphragmatic hernia without obstruction or gangrene: Secondary | ICD-10-CM | POA: Diagnosis not present

## 2014-05-19 DIAGNOSIS — S32040S Wedge compression fracture of fourth lumbar vertebra, sequela: Secondary | ICD-10-CM | POA: Diagnosis not present

## 2014-05-19 DIAGNOSIS — I429 Cardiomyopathy, unspecified: Secondary | ICD-10-CM | POA: Diagnosis not present

## 2014-05-19 DIAGNOSIS — S32040A Wedge compression fracture of fourth lumbar vertebra, initial encounter for closed fracture: Secondary | ICD-10-CM | POA: Diagnosis present

## 2014-05-19 LAB — CBC WITH DIFFERENTIAL/PLATELET
BASOS ABS: 0.1 10*3/uL (ref 0.0–0.1)
Basophils Relative: 1 % (ref 0–1)
Eosinophils Absolute: 0.1 10*3/uL (ref 0.0–0.7)
Eosinophils Relative: 1 % (ref 0–5)
HEMATOCRIT: 50.8 % (ref 39.0–52.0)
Hemoglobin: 17 g/dL (ref 13.0–17.0)
Lymphocytes Relative: 8 % — ABNORMAL LOW (ref 12–46)
Lymphs Abs: 0.7 10*3/uL (ref 0.7–4.0)
MCH: 31.3 pg (ref 26.0–34.0)
MCHC: 33.5 g/dL (ref 30.0–36.0)
MCV: 93.6 fL (ref 78.0–100.0)
Monocytes Absolute: 0.3 10*3/uL (ref 0.1–1.0)
Monocytes Relative: 3 % (ref 3–12)
NEUTROS ABS: 7.5 10*3/uL (ref 1.7–7.7)
Neutrophils Relative %: 87 % — ABNORMAL HIGH (ref 43–77)
PLATELETS: 153 10*3/uL (ref 150–400)
RBC: 5.43 MIL/uL (ref 4.22–5.81)
RDW: 15.6 % — AB (ref 11.5–15.5)
WBC: 8.6 10*3/uL (ref 4.0–10.5)

## 2014-05-19 LAB — COMPREHENSIVE METABOLIC PANEL
ALT: 12 U/L — ABNORMAL LOW (ref 17–63)
AST: 25 U/L (ref 15–41)
Albumin: 3.8 g/dL (ref 3.5–5.0)
Alkaline Phosphatase: 99 U/L (ref 38–126)
Anion gap: 12 (ref 5–15)
BUN: 16 mg/dL (ref 6–20)
CALCIUM: 8.8 mg/dL — AB (ref 8.9–10.3)
CO2: 24 mmol/L (ref 22–32)
Chloride: 101 mmol/L (ref 101–111)
Creatinine, Ser: 1.24 mg/dL (ref 0.61–1.24)
GFR calc non Af Amer: 52 mL/min — ABNORMAL LOW (ref >60)
GLUCOSE: 108 mg/dL — AB (ref 70–99)
Potassium: 3.8 mmol/L (ref 3.5–5.1)
SODIUM: 137 mmol/L (ref 135–145)
TOTAL PROTEIN: 7.2 g/dL (ref 6.5–8.1)
Total Bilirubin: 1.6 mg/dL — ABNORMAL HIGH (ref 0.3–1.2)

## 2014-05-19 LAB — URINALYSIS, ROUTINE W REFLEX MICROSCOPIC
Bilirubin Urine: NEGATIVE
Glucose, UA: NEGATIVE mg/dL
Hgb urine dipstick: NEGATIVE
KETONES UR: NEGATIVE mg/dL
Leukocytes, UA: NEGATIVE
NITRITE: NEGATIVE
Protein, ur: NEGATIVE mg/dL
Specific Gravity, Urine: 1.007 (ref 1.005–1.030)
UROBILINOGEN UA: 0.2 mg/dL (ref 0.0–1.0)
pH: 7 (ref 5.0–8.0)

## 2014-05-19 MED ORDER — HYDROMORPHONE HCL 1 MG/ML IJ SOLN
2.0000 mg | INTRAMUSCULAR | Status: DC | PRN
  Administered 2014-05-21: 2 mg via INTRAVENOUS
  Filled 2014-05-19: qty 2

## 2014-05-19 MED ORDER — HYDROCODONE-ACETAMINOPHEN 5-325 MG PO TABS
1.0000 | ORAL_TABLET | ORAL | Status: DC | PRN
  Administered 2014-05-21: 1 via ORAL
  Filled 2014-05-19: qty 2
  Filled 2014-05-19: qty 1

## 2014-05-19 MED ORDER — SODIUM CHLORIDE 0.9 % IJ SOLN: 3.0000 mL | INTRAMUSCULAR | Status: DC | PRN

## 2014-05-19 MED ORDER — MORPHINE SULFATE 4 MG/ML IJ SOLN
4.0000 mg | Freq: Once | INTRAMUSCULAR | Status: AC
  Administered 2014-05-19: 4 mg via INTRAVENOUS
  Filled 2014-05-19: qty 1

## 2014-05-19 MED ORDER — SODIUM CHLORIDE 0.9 % IV SOLN: 250.0000 mL | INTRAVENOUS | Status: DC | PRN

## 2014-05-19 MED ORDER — DIAZEPAM 5 MG PO TABS
5.0000 mg | ORAL_TABLET | Freq: Once | ORAL | Status: AC
  Administered 2014-05-19: 5 mg via ORAL
  Filled 2014-05-19: qty 1

## 2014-05-19 MED ORDER — ONDANSETRON HCL 4 MG/2ML IJ SOLN
4.0000 mg | Freq: Four times a day (QID) | INTRAMUSCULAR | Status: DC | PRN
  Administered 2014-05-21: 4 mg via INTRAVENOUS
  Filled 2014-05-19: qty 2

## 2014-05-19 MED ORDER — ONDANSETRON HCL 4 MG PO TABS: 4.0000 mg | ORAL_TABLET | Freq: Four times a day (QID) | ORAL | Status: DC | PRN

## 2014-05-19 MED ORDER — SODIUM CHLORIDE 0.9 % IJ SOLN
3.0000 mL | Freq: Two times a day (BID) | INTRAMUSCULAR | Status: DC
  Administered 2014-05-19 – 2014-05-22 (×6): 3 mL via INTRAVENOUS

## 2014-05-19 NOTE — ED Notes (Signed)
Jennifer,RN on [place] given report
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
## 2014-05-19 NOTE — ED Notes (Signed)
Was seen at [facility] on Friday for pain in low back, had an xray-- was told that he needed to have fluid drawn off lower back. Pain has gotten worse, is supposed to get an appt for this week "to have this drained"

## 2014-05-19 NOTE — ED Notes (Signed)
Family at bedside.pt placed on monitor , cont. bp and pulse ox monitoring.

## 2014-05-19 NOTE — ED Provider Notes (Signed)
Medical screening examination/treatment/procedure(s) were conducted as a shared visit with non-physician practitioner(s) and myself.  I personally evaluated the patient during the encounter.   EKG Interpretation None     Patient seen by me. Patient prescribed medications by his primary care doctor for back pain. Patient with complaint of low back pain patient had an x-ray earlier this week. Primary care is following up to treat him with muscle relaxers and anti-inflammatory medicines. Patient states that the pain isn't getting any better. No fevers no nausea no vomiting. Patient does have decreased appetite. Abdomen is soft nontender. Pain is to the lower part of the back. No significant neuro focal deficits.  Urinalysis is negative for urinary tract infection. CT scan is pending. And we'll guide disposition. Patient most likely will be able to be discharged home with the pain medicine to take.   EKG Interpretation None        Fredia Sorrow, MD 05/19/14 [phone]

## 2014-05-19 NOTE — Progress Notes (Signed)
NURSING PROGRESS NOTE  MACK ALVIDREZ [id number] Admission Data: 05/19/2014 8:13 PM Attending Provider: Phillips Grout, MD [id number] G, MD Code Status: Full  MACLANE HOLLORAN is a 81 y.o. male patient admitted from ED:  -No acute distress noted.  -No complaints of shortness of breath.  -No complaints of chest pain.    Blood pressure 141/51, pulse 61, temperature 97.6 F (36.4 C), temperature source Oral, resp. rate 18, height 5\' 4"  (1.626 m), weight 90 kg (198 lb 6.6 oz), SpO2 98 %.   IV Fluids:  IV in place, occlusive dsg intact without redness, IV cath antecubital left, condition patent and no redness none.   Allergies:  Penicillins  Past Medical History:   has a past medical history of Diastolic dysfunction; History of pituitary tumor; Pituitary insufficiency; Hypothyroidism; GERD (gastroesophageal reflux disease); Splenic flexure syndrome; CHF (congestive heart failure); Hiatal hernia; Coronary artery disease; Cardiomyopathy; Diverticulitis; Nephrolithiasis; Fatty liver; Gout; Arthritis; Exogenous obesity; Personal history of colonic polyps (07/23/2008); Ureteral stone; Thrombocytopenia; and Shortness of breath.  Past Surgical History:   has past surgical history that includes Transphenoidal pituitary resection (2001); Cholecystectomy, laparoscopic (2004); and Esophageal dilation (12/2006).  Social History:   reports that he quit smoking about 52 years ago. He has never used smokeless tobacco. He reports that he does not drink alcohol or use illicit drugs.  Skin: Abrasions to face where skin cancer was removed.  Patient/Family orientated to room. Information packet given to patient/family. Admission inpatient armband information verified with patient/family to include name and date of birth and placed on patient arm. Side rails up x 2, fall assessment and education completed with patient/family. Patient/family able to verbalize understanding of risk associated with falls and  verbalized understanding to call for assistance before getting out of bed. Call light within reach. Patient/family able to voice and demonstrate understanding of unit orientation instructions.    Will continue to evaluate and treat per MD orders.

## 2014-05-19 NOTE — ED Provider Notes (Signed)
CSN: [id number]     Arrival date & time 05/19/14  1240 History   First MD Initiated Contact with Patient 05/19/14 1311     Chief Complaint  Patient presents with  . Back Pain     (Consider location/radiation/quality/duration/timing/severity/associated sxs/prior Treatment) HPI  Scott Mooney is a 81 y.o. male with extensive past medical history complaining of severe exacerbation of his chronic low back pain, rates his pain at 8 out of 10 it radiates down the posterior left leg. Patient has been taking meloxicam and mobic at home with no relief. Exacerbated by changing position, standing. Denies fever, chills, weakness, numbness. States he saw his PCP 2 days ago and x-ray was taken and he was told that he needed to have fluid drawn off the spine.  Past Medical History  Diagnosis Date  . Diastolic dysfunction   . History of pituitary tumor   . Pituitary insufficiency   . Hypothyroidism   . GERD (gastroesophageal reflux disease)   . Splenic flexure syndrome   . CHF (congestive heart failure)     with diastolic dysfunction  . Hiatal hernia   . Coronary artery disease   . Cardiomyopathy   . Diverticulitis     recurrent  . Nephrolithiasis     right-sided  . Fatty liver   . Gout   . Arthritis   . Exogenous obesity   . Personal history of colonic polyps 07/23/2008    TUBULAR ADENOMA  . Ureteral stone     distal ureteral stone   . Thrombocytopenia   . Shortness of breath    Past Surgical History  Procedure Laterality Date  . Transphenoidal pituitary resection  2001  . Cholecystectomy, laparoscopic  2004  . Esophageal dilation  12/2006    endoscopy with dilatation of esophageal stricture   Family History  Problem Relation Age of Onset  . Colon cancer Brother   . Cancer Sister     ?  . Stroke Father   . Skin cancer Mother    History  Substance Use Topics  . Smoking status: Former Smoker    Quit date: 01/11/1962  . Smokeless tobacco: Never Used  . Alcohol Use: No     Review of Systems  10 systems reviewed and found to be negative, except as noted in the HPI.   Allergies  Penicillins  Home Medications   Prior to Admission medications   Medication Sig Start Date End Date Taking? Authorizing Provider  aspirin EC 81 MG tablet Take 81 mg by mouth daily.    Historical Provider, MD  Emollient (LUBRIDERM SERIOUSLY SENSITIVE) LOTN Apply 1 application topically 2 (two) times daily. Apply on his Lower extremities skin to keep it moist 07/10/13   Leanna Battles, MD  Emollient Jilda Panda) LOTN Apply 1 application topically daily.    Historical Provider, MD  feeding supplement, ENSURE COMPLETE, (ENSURE COMPLETE) LIQD Take 237 mLs by mouth 2 (two) times daily between meals. 07/10/13   Leanna Battles, MD  furosemide (LASIX) 40 MG tablet Take 2 tablets (80 mg total) by mouth daily. 05/01/14   Darlin Coco, MD  hydrocortisone (CORTEF) 20 MG tablet Take 1 tablet by mouth twice daily for 7 days, then 1 tablet every morning 07/10/13   Leanna Battles, MD  levothyroxine (SYNTHROID, LEVOTHROID) 125 MCG tablet Take 125 mcg by mouth every morning.    Historical Provider, MD  multivitamin [facility]) per tablet Take 1 tablet by mouth daily.      Historical Provider, MD  ondansetron [facility])  4 MG tablet Take 1 tablet (4 mg total) by mouth daily. 07/10/13   Leanna Battles, MD  potassium chloride SA (K-DUR,KLOR-CON) 20 MEQ tablet Take 2 tablets (40 mEq total) by mouth daily. 07/16/12   Barton Dubois, MD  PRESCRIPTION MEDICATION Place 1 drop into both eyes daily as needed (for dry or itchy eyes).    Historical Provider, MD  ranolazine (RANEXA) 500 MG 12 hr tablet Take 1 tablet (500 mg total) by mouth 2 (two) times daily. 09/19/12   Darlin Coco, MD   BP 119/32 mmHg  Pulse 75  Temp(Src) 97.8 F (36.6 C) (Oral)  Resp 20  Wt 205 lb (92.987 kg)  SpO2 97% Physical Exam  Constitutional: He is oriented to person, place, and time. He appears well-developed and well-nourished.  No distress.  HENT:  Head: Normocephalic and atraumatic.  Mouth/Throat: Oropharynx is clear and moist.  Eyes: Conjunctivae and EOM are normal. Pupils are equal, round, and reactive to light.  Neck: Normal range of motion.  Cardiovascular: Normal rate and intact distal pulses.   Pulmonary/Chest: Effort normal and breath sounds normal. No stridor. No respiratory distress. He has no wheezes. He has no rales. He exhibits no tenderness.  Abdominal: Soft. Bowel sounds are normal. There is no tenderness.  Musculoskeletal: Normal range of motion. He exhibits no edema or tenderness.  No overlying skin changes in the lumbar sacral area, no midline tenderness to percussion of the lumbar sacral spine. No paraspinal musculature tenderness to palpation or spasm. Extensor hallux longus is 5 out of 5 bilaterally.  Neurological: He is alert and oriented to person, place, and time.  Skin: Rash noted.  Multiple lesions to face  Psychiatric: He has a normal mood and affect.  Nursing note and vitals reviewed.   ED Course  Procedures (including critical care time) Labs Review Labs Reviewed - No data to display  Imaging Review No results found.   EKG Interpretation None      MDM   Final diagnoses:  Pain  Non-traumatic compression fracture of T4 thoracic vertebra, initial encounter  Inability to ambulate due to multiple joints  Pre-op chest exam    Filed Vitals:   05/19/14 1259  BP: 119/32  Pulse: 75  Temp: 97.8 F (36.6 C)  TempSrc: Oral  Resp: 20  Weight: 205 lb (92.987 kg)  SpO2: 97%    Medications  morphine 4 MG/ML injection 4 mg (4 mg Intravenous Given 05/19/14 1433)  diazepam (VALIUM) tablet 5 mg (5 mg Oral Given 05/19/14 1411)    Scott Mooney is a pleasant 81 y.o. male presenting with severe back pain. Neuro exam is nonfocal. Urinalysis is clean. CT shows acute versus subacute L4 endplate compression fraction from compression fracture with mild retropulsion.  Patient states he  feels better after IV morphine and Valium however, he cannot get up to ambulate. Patient lives at home with his elderly wife, his son comes to the house intermittently to help him. Patient is not able to ambulate independently in the ED. Even with improved pain control from IV morphine. Will need admission.  Case discussed with [facility] hospitalist Dr. Shanon Brow who accepts admission for pain control and possible kyphoplasty. Preop EKG chest x-ray and blood work are ordered.  This is a shared visit with the attending physician who personally evaluated the patient and agrees with the care plan.    Monico Blitz, PA-C 05/19/14 1646  Fredia Sorrow, MD 05/23/14 [phone]

## 2014-05-20 ENCOUNTER — Encounter (HOSPITAL_COMMUNITY): Payer: Self-pay | Admitting: Radiology

## 2014-05-20 DIAGNOSIS — S32040S Wedge compression fracture of fourth lumbar vertebra, sequela: Secondary | ICD-10-CM | POA: Diagnosis not present

## 2014-05-20 DIAGNOSIS — E2740 Unspecified adrenocortical insufficiency: Secondary | ICD-10-CM

## 2014-05-20 DIAGNOSIS — S32040A Wedge compression fracture of fourth lumbar vertebra, initial encounter for closed fracture: Secondary | ICD-10-CM | POA: Diagnosis not present

## 2014-05-20 DIAGNOSIS — I5032 Chronic diastolic (congestive) heart failure: Secondary | ICD-10-CM | POA: Diagnosis not present

## 2014-05-20 DIAGNOSIS — M545 Low back pain: Secondary | ICD-10-CM

## 2014-05-20 MED ORDER — VANCOMYCIN HCL IN DEXTROSE 1-5 GM/200ML-% IV SOLN
1000.0000 mg | INTRAVENOUS | Status: AC
  Administered 2014-05-21: 1000 mg via INTRAVENOUS
  Filled 2014-05-20 (×2): qty 200

## 2014-05-20 MED ORDER — HYDROCORTISONE 20 MG PO TABS
20.0000 mg | ORAL_TABLET | Freq: Every day | ORAL | Status: DC
  Administered 2014-05-20 – 2014-05-23 (×3): 20 mg via ORAL
  Filled 2014-05-20 (×4): qty 1

## 2014-05-20 MED ORDER — RANOLAZINE ER 500 MG PO TB12
500.0000 mg | ORAL_TABLET | Freq: Two times a day (BID) | ORAL | Status: DC
  Administered 2014-05-20 – 2014-05-23 (×6): 500 mg via ORAL
  Filled 2014-05-20 (×8): qty 1

## 2014-05-20 MED ORDER — METHOCARBAMOL 500 MG PO TABS
500.0000 mg | ORAL_TABLET | Freq: Every day | ORAL | Status: DC
  Administered 2014-05-20 – 2014-05-22 (×3): 500 mg via ORAL
  Filled 2014-05-20 (×4): qty 1

## 2014-05-20 MED ORDER — LEVOTHYROXINE SODIUM 125 MCG PO TABS
125.0000 ug | ORAL_TABLET | Freq: Every morning | ORAL | Status: DC
Start: 2014-05-20 — End: 2014-05-23
  Administered 2014-05-20 – 2014-05-23 (×3): 125 ug via ORAL
  Filled 2014-05-20 (×4): qty 1

## 2014-05-20 NOTE — Progress Notes (Signed)
PROGRESS NOTE  Scott Mooney QJJ:[id number] DOB: 1929/02/23 DOA: 05/19/2014 PCP: Donnajean Lopes, MD  Assessment/Plan: Compression fracture of L4 lumbar vertebra-  -iv dilaudid and po norco for pain control -per admitting doctor: IR has been called dr Vernard Gambles and their team will see patient in the am. -keep npo after midnight    Chronic diastolic heart failure- stable   Adrenal insufficiency- stable, resume home meds of cortef daily   Acute low back pain- As above  Code Status: full Family Communication: patient Disposition Plan:    Consultants:  IR  Procedures:      HPI/Subjective: No pain while laying in bed  Objective: Filed Vitals:   05/20/14 0539  BP: 129/62  Pulse:   Temp: 98.5 F (36.9 C)  Resp: 15    Intake/Output Summary (Last 24 hours) at 05/20/14 1016 Last data filed at 05/20/14 0802  Gross per 24 hour  Intake      0 ml  Output    600 ml  Net   -600 ml   Filed Weights   05/19/14 1259 05/19/14 1812 05/20/14 0539  Weight: 92.987 kg (205 lb) 90 kg (198 lb 6.6 oz) 90.7 kg (199 lb 15.3 oz)    Exam:   General:  A+Ox3, NAD  Cardiovascular: rrr  Respiratory: clear  Abdomen: +BS, soft  Musculoskeletal: no edema  Data Reviewed: Basic Metabolic Panel:  Recent Labs Lab 05/19/14 1415  NA 137  K 3.8  CL 101  CO2 24  GLUCOSE 108*  BUN 16  CREATININE 1.24  CALCIUM 8.8*   Liver Function Tests:  Recent Labs Lab 05/19/14 1415  AST 25  ALT 12*  ALKPHOS 99  BILITOT 1.6*  PROT 7.2  ALBUMIN 3.8   No results for input(s): LIPASE, AMYLASE in the last 168 hours. No results for input(s): AMMONIA in the last 168 hours. CBC:  Recent Labs Lab 05/19/14 1415  WBC 8.6  NEUTROABS 7.5  HGB 17.0  HCT 50.8  MCV 93.6  PLT 153   Cardiac Enzymes: No results for input(s): CKTOTAL, CKMB, CKMBINDEX, TROPONINI in the last 168 hours. BNP (last 3 results) No results for input(s): BNP in the last 8760 hours.  ProBNP (last 3  results) No results for input(s): PROBNP in the last 8760 hours.  CBG: No results for input(s): GLUCAP in the last 168 hours.  No results found for this or any previous visit (from the past 240 hour(s)).   Studies: Dg Chest Port 1 View  05/19/2014   CLINICAL DATA:  81 year old male with a history of cough. No chest pain  EXAM: PORTABLE CHEST - 1 VIEW  COMPARISON:  07/09/2013, 07/14/2012  FINDINGS: Cardiomediastinal silhouette unchanged with borderline cardiomegaly. Atherosclerotic calcifications of the aortic arch.  Calcifications in the left hilar region representing calcified lymph nodes, present on prior CT. Surgical changes of the right upper lung.  Low lung volumes accentuates the interstitium.  Interstitial opacities throughout bilateral lungs, present on comparison studies. No confluent airspace disease. No large pleural effusion.  Continuing asymmetric elevation of the right hemidiaphragm. Left base not well evaluated.  No displaced fracture.  IMPRESSION: Low lung volumes with chronic lung changes and no evidence of lobar pneumonia.  Atherosclerosis.  Surgical changes of the right upper lung.  Signed,  Dulcy Fanny. Earleen Newport, DO  [facility]  [facility]   Electronically Signed   By: Corrie Mckusick D.O.   On: 05/19/2014 17:41   Ct Renal Stone Study  05/19/2014  CLINICAL DATA:  Lumbago/low back pain for 4 days. Nausea and vomiting. History of kidney stones.  EXAM: CT ABDOMEN AND PELVIS WITHOUT CONTRAST  TECHNIQUE: Multidetector CT imaging of the abdomen and pelvis was performed following the standard protocol without IV contrast.  COMPARISON:  09/08/2012.  02/14/2009.  FINDINGS: Musculoskeletal: Acute or subacute L4 inferior endplate compression fracture. Fracture plane remains crisp along the inferior endplate. Loss of vertebral body height is about 40%. Mild retropulsion. Chronic T12 compression fracture.  Lung Bases: Dependent atelectasis.  Liver:  Micronodular contour of the liver with enlargement of the LEFT hepatic lobe compatible with hepatic cirrhosis. Unenhanced CT was performed per clinician order. Lack of IV contrast limits sensitivity and specificity, especially for evaluation of abdominal/pelvic solid viscera. Tiny posterior RIGHT hepatic lobe cyst is unchanged compared to 2011.  Spleen:  Splenomegaly with old granulomatous disease.  Gallbladder:  Surgically absent.  Common bile duct:  Normal.  Pancreas:  Normal.  Adrenal glands:  Normal bilaterally.  Kidneys: Bilateral renal cysts and renal cortical atrophy. Punctate nonobstructing LEFT inferior pole renal collecting system calculus. Punctate RIGHT renal collecting system calculus versus vascular calcification. No hydronephrosis. LEFT ureter appears normal. RIGHT ureter normal.  Stomach:  Small to moderate hiatal hernia.  Small bowel:  Grossly normal.  Colon:   Diverticulosis without diverticulitis.  Pelvic Genitourinary: Prostate calcifications. Urinary bladder normal.  Peritoneum: No free fluid. Small bowel mesenteric fat stranding is present, probably secondary to cirrhosis. Prominent vessels are present in the anterior abdomen, likely secondary to portal venous hypertension.  Vascular/lymphatic: Atherosclerosis without an acute vascular abnormality by noncontrast CT. Coronary artery atherosclerosis is present. If office based assessment of coronary risk factors has not been performed, it is now recommended.  Body Wall: Normal.  IMPRESSION: 1. Acute or subacute L4 inferior endplate compression fracture with mild retropulsion. Chronic T12 compression fracture. 2. Renal cysts. 3. Hepatic cirrhosis and splenomegaly consistent with portal venous hypertension. 4. Cholecystectomy. 5. Hepatic and renal cysts. 6. Small moderate hiatal hernia. 7. Atherosclerosis and coronary artery disease. 8. Punctate nonobstructing renal collecting system calculi versus vascular calcifications.   Electronically Signed    By: Dereck Ligas M.D.   On: 05/19/2014 15:59    Scheduled Meds: . sodium chloride  3 mL Intravenous Q12H   Continuous Infusions:  Antibiotics Given (last 72 hours)    None      Principal Problem:   Compression fracture of L4 lumbar vertebra Active Problems:   Chronic diastolic heart failure   Adrenal insufficiency   Acute low back pain    Time spent: 25 min    Leisl Spurrier, [facility] Pager [phone]. If 7PM-7AM, please contact night-coverage at [email or web address], password [facility] 05/20/2014, 10:16 AM

## 2014-05-20 NOTE — H&P (Signed)
Chief Complaint: Chief Complaint  Patient presents with  . Back Pain   Referring Physician(s): Dr. Eliseo Squires  History of Present Illness: Scott Mooney is a 81 y.o. male admitted with uncontrolled low back pain 10/10 x 4 days, no known injury-receiving IV pain medication without relief 5/10, CT with new L4 compression fracture, chronic T12 compression fracture. The patient has a history of pituitary tumor and insufficiency on chronic steroids. IR received request for Vertebroplasty/Kyphoplasty consult. The patient denies any loss of bowel or bladder function, he denies any radiation of pain down legs. He does live at home with his spouse and at baseline ambulates without assistance. He denies any chest pain, shortness of breath or palpitations. He denies any active signs of bleeding or excessive bruising. He denies any recent fever or chills. The patient denies any history of sleep apnea or chronic oxygen use. He denies any known complications to sedation.    Past Medical History  Diagnosis Date  . Diastolic dysfunction   . History of pituitary tumor   . Pituitary insufficiency   . Hypothyroidism   . GERD (gastroesophageal reflux disease)   . Splenic flexure syndrome   . CHF (congestive heart failure)     with diastolic dysfunction  . Hiatal hernia   . Coronary artery disease   . Cardiomyopathy   . Diverticulitis     recurrent  . Nephrolithiasis     right-sided  . Fatty liver   . Gout   . Arthritis   . Exogenous obesity   . Personal history of colonic polyps 07/23/2008    TUBULAR ADENOMA  . Ureteral stone     distal ureteral stone   . Thrombocytopenia   . Shortness of breath     Past Surgical History  Procedure Laterality Date  . Transphenoidal pituitary resection  2001  . Cholecystectomy, laparoscopic  2004  . Esophageal dilation  12/2006    endoscopy with dilatation of esophageal stricture   Allergies: Penicillins  Medications: Prior to Admission medications     Medication Sig Start Date End Date Taking? Authorizing Provider  aspirin EC 81 MG tablet Take 81 mg by mouth daily.   Yes Historical Provider, MD  Emollient [facility]) Apply 1 application topically daily.   Yes Historical Provider, MD  esomeprazole (NEXIUM) 40 MG capsule Take 40 mg by mouth daily at 12 noon.   Yes Historical Provider, MD  feeding supplement, ENSURE COMPLETE, (ENSURE COMPLETE) LIQD Take 237 mLs by mouth 2 (two) times daily between meals. 07/10/13  Yes Leanna Battles, MD  furosemide (LASIX) 40 MG tablet Take 2 tablets (80 mg total) by mouth daily. 05/01/14  Yes Darlin Coco, MD  hydrocortisone (CORTEF) 20 MG tablet Take 1 tablet by mouth twice daily for 7 days, then 1 tablet every morning Patient taking differently: Take 20 mg by mouth daily.  07/10/13  Yes Leanna Battles, MD  levothyroxine (SYNTHROID, LEVOTHROID) 125 MCG tablet Take 125 mcg by mouth every morning.   Yes Historical Provider, MD  meloxicam (MOBIC) 7.5 MG tablet Take 7.5 mg by mouth 2 (two) times daily. For back pain   Yes Historical Provider, MD  methocarbamol (ROBAXIN) 500 MG tablet Take 500 mg by mouth at bedtime.   Yes Historical Provider, MD  ondansetron (ZOFRAN) 4 MG tablet Take 1 tablet (4 mg total) by mouth daily. 07/10/13  Yes Leanna Battles, MD  potassium chloride SA (K-DUR,KLOR-CON) 20 MEQ tablet Take 2 tablets (40 mEq total) by mouth daily.  Patient taking differently: Take 20 mEq by mouth 2 (two) times daily.  07/16/12  Yes Barton Dubois, MD  ranolazine (RANEXA) 500 MG 12 hr tablet Take 1 tablet (500 mg total) by mouth 2 (two) times daily. 09/19/12  Yes Darlin Coco, MD  Emollient (LUBRIDERM SERIOUSLY SENSITIVE) LOTN Apply 1 application topically 2 (two) times daily. Apply on his Lower extremities skin to keep it moist Patient not taking: Reported on 05/19/2014 07/10/13   Leanna Battles, MD    Family History  Problem Relation Age of Onset  . Colon cancer Brother   . Cancer Sister     ?   . Stroke Father   . Skin cancer Mother     History   Social History  . Marital Status: Married    Spouse Name: N/A  . Number of Children: 1  . Years of Education: N/A   Occupational History  . retired    Social History Main Topics  . Smoking status: Former Smoker    Quit date: 01/11/1962  . Smokeless tobacco: Never Used  . Alcohol Use: No  . Drug Use: No  . Sexual Activity: Not on file   Other Topics Concern  . None   Social History Narrative   Review of Systems: A 12 point ROS discussed and pertinent positives are indicated in the HPI above.  All other systems are negative.  Review of Systems  Vital Signs: BP 129/62 mmHg  Pulse 64  Temp(Src) 98.5 F (36.9 C) (Oral)  Resp 15  Ht 5\' 4"  (1.626 m)  Wt 199 lb 15.3 oz (90.7 kg)  BMI 34.31 kg/m2  SpO2 96%  Physical Exam  Constitutional: He is oriented to person, place, and time. No distress.  HENT:  Head: Normocephalic and atraumatic.  Neck: No tracheal deviation present.  Cardiovascular: Normal rate and regular rhythm.  Exam reveals no gallop and no friction rub.   No murmur heard. Pulmonary/Chest: Effort normal and breath sounds normal. No respiratory distress. He has no wheezes. He has no rales.  Abdominal: Soft. Bowel sounds are normal. He exhibits no distension. There is no tenderness.  Musculoskeletal: He exhibits tenderness.  L4 region TTP  Neurological: He is alert and oriented to person, place, and time.  Skin: He is not diaphoretic.   Mallampati Score:  MD Evaluation Airway: WNL Heart: WNL Abdomen: WNL Chest/ Lungs: WNL ASA  Classification: 3 Mallampati/Airway Score: Two  Imaging: Dg Chest Port 1 View  05/19/2014   CLINICAL DATA:  81 year old male with a history of cough. No chest pain  EXAM: PORTABLE CHEST - 1 VIEW  COMPARISON:  07/09/2013, 07/14/2012  FINDINGS: Cardiomediastinal silhouette unchanged with borderline cardiomegaly. Atherosclerotic calcifications of the aortic arch.   Calcifications in the left hilar region representing calcified lymph nodes, present on prior CT. Surgical changes of the right upper lung.  Low lung volumes accentuates the interstitium.  Interstitial opacities throughout bilateral lungs, present on comparison studies. No confluent airspace disease. No large pleural effusion.  Continuing asymmetric elevation of the right hemidiaphragm. Left base not well evaluated.  No displaced fracture.  IMPRESSION: Low lung volumes with chronic lung changes and no evidence of lobar pneumonia.  Atherosclerosis.  Surgical changes of the right upper lung.  Signed,  Dulcy Fanny. Earleen Newport, DO  [facility]  [facility]   Electronically Signed   By: Corrie Mckusick D.O.   On: 05/19/2014 17:41   Ct Renal Stone Study  05/19/2014   CLINICAL DATA:  Lumbago/low back  pain for 4 days. Nausea and vomiting. History of kidney stones.  EXAM: CT ABDOMEN AND PELVIS WITHOUT CONTRAST  TECHNIQUE: Multidetector CT imaging of the abdomen and pelvis was performed following the standard protocol without IV contrast.  COMPARISON:  09/08/2012.  02/14/2009.  FINDINGS: Musculoskeletal: Acute or subacute L4 inferior endplate compression fracture. Fracture plane remains crisp along the inferior endplate. Loss of vertebral body height is about 40%. Mild retropulsion. Chronic T12 compression fracture.  Lung Bases: Dependent atelectasis.  Liver: Micronodular contour of the liver with enlargement of the LEFT hepatic lobe compatible with hepatic cirrhosis. Unenhanced CT was performed per clinician order. Lack of IV contrast limits sensitivity and specificity, especially for evaluation of abdominal/pelvic solid viscera. Tiny posterior RIGHT hepatic lobe cyst is unchanged compared to 2011.  Spleen:  Splenomegaly with old granulomatous disease.  Gallbladder:  Surgically absent.  Common bile duct:  Normal.  Pancreas:  Normal.  Adrenal glands:  Normal bilaterally.  Kidneys:  Bilateral renal cysts and renal cortical atrophy. Punctate nonobstructing LEFT inferior pole renal collecting system calculus. Punctate RIGHT renal collecting system calculus versus vascular calcification. No hydronephrosis. LEFT ureter appears normal. RIGHT ureter normal.  Stomach:  Small to moderate hiatal hernia.  Small bowel:  Grossly normal.  Colon:   Diverticulosis without diverticulitis.  Pelvic Genitourinary: Prostate calcifications. Urinary bladder normal.  Peritoneum: No free fluid. Small bowel mesenteric fat stranding is present, probably secondary to cirrhosis. Prominent vessels are present in the anterior abdomen, likely secondary to portal venous hypertension.  Vascular/lymphatic: Atherosclerosis without an acute vascular abnormality by noncontrast CT. Coronary artery atherosclerosis is present. If office based assessment of coronary risk factors has not been performed, it is now recommended.  Body Wall: Normal.  IMPRESSION: 1. Acute or subacute L4 inferior endplate compression fracture with mild retropulsion. Chronic T12 compression fracture. 2. Renal cysts. 3. Hepatic cirrhosis and splenomegaly consistent with portal venous hypertension. 4. Cholecystectomy. 5. Hepatic and renal cysts. 6. Small moderate hiatal hernia. 7. Atherosclerosis and coronary artery disease. 8. Punctate nonobstructing renal collecting system calculi versus vascular calcifications.   Electronically Signed   By: Dereck Ligas M.D.   On: 05/19/2014 15:59    Labs:  CBC:  Recent Labs  07/09/13 1453 07/10/13 0544 05/19/14 1415  WBC 8.0 6.5 8.6  HGB 18.7* 17.7* 17.0  HCT 55.9* 53.4* 50.8  PLT 138* 92* 153    COAGS: No results for input(s): INR, APTT in the last 8760 hours.  BMP:  Recent Labs  07/09/13 1453 07/10/13 0544 05/19/14 1415  NA 136* 138 137  K 4.1 5.0 3.8  CL 95* 102 101  CO2 22 21 24   GLUCOSE 133* 142* 108*  BUN 23 22 16   CALCIUM 8.3* 8.3* 8.8*  CREATININE 1.64* 1.22 1.24  GFRNONAA 37*  53* 52*  GFRAA 43* 62* >60    LIVER FUNCTION TESTS:  Recent Labs  07/09/13 1453 05/19/14 1415  BILITOT 2.5* 1.6*  AST 51* 25  ALT 21 12*  ALKPHOS 101 99  PROT 7.1 7.2  ALBUMIN 3.6 3.8   Assessment and Plan: Uncontrolled low back pain x 4 days, no known injury-receiving IV pain medication without relief CT with new L4 compression fracture, chronic T12 compression fracture History of pituitary tumor and insufficiency on chronic steroids Request for Vertebroplasty/Kyphoplasty consult Risks and Benefits discussed with the patient and his family today including, but not limited to education regarding the natural healing process of compression fractures without intervention, bleeding, infection, cement migration which may cause spinal cord  damage, paralysis, pulmonary embolism or even death. All of the patient's questions were answered, patient is agreeable to proceed. Consent signed and in chart. Insurance approval pending, once approved will move forward with procedure, will keep NPO after midnight and check INR for possible 5/10 procedure.   Thank you for this interesting consult.  I greatly enjoyed meeting Scott Mooney and look forward to participating in their care.  SignedHedy Jacob 05/20/2014, 1:19 PM   I spent a total of 40 Minutes in face to face in clinical consultation, greater than 50% of which was counseling/coordinating care for uncontrolled back pain and new compression fracture.

## 2014-05-21 ENCOUNTER — Observation Stay (HOSPITAL_COMMUNITY): Payer: PPO

## 2014-05-21 DIAGNOSIS — S32040A Wedge compression fracture of fourth lumbar vertebra, initial encounter for closed fracture: Secondary | ICD-10-CM | POA: Diagnosis not present

## 2014-05-21 DIAGNOSIS — E2740 Unspecified adrenocortical insufficiency: Secondary | ICD-10-CM | POA: Diagnosis not present

## 2014-05-21 DIAGNOSIS — I5032 Chronic diastolic (congestive) heart failure: Secondary | ICD-10-CM | POA: Diagnosis not present

## 2014-05-21 DIAGNOSIS — M545 Low back pain: Secondary | ICD-10-CM | POA: Diagnosis not present

## 2014-05-21 LAB — PROTIME-INR
INR: 1.26 (ref 0.00–1.49)
Prothrombin Time: 15.9 seconds — ABNORMAL HIGH (ref 11.6–15.2)

## 2014-05-21 LAB — SURGICAL PCR SCREEN
MRSA, PCR: NEGATIVE
STAPHYLOCOCCUS AUREUS: POSITIVE — AB

## 2014-05-21 MED ORDER — MIDAZOLAM HCL 2 MG/2ML IJ SOLN
INTRAMUSCULAR | Status: AC | PRN
  Administered 2014-05-21: 1 mg via INTRAVENOUS

## 2014-05-21 MED ORDER — TOBRAMYCIN SULFATE 1.2 G IJ SOLR
INTRAMUSCULAR | Status: AC
Start: 2014-05-21 — End: 2014-05-21
  Filled 2014-05-21: qty 1.2

## 2014-05-21 MED ORDER — HYDROMORPHONE HCL 1 MG/ML IJ SOLN
INTRAMUSCULAR | Status: AC
  Filled 2014-05-21: qty 1

## 2014-05-21 MED ORDER — CHLORHEXIDINE GLUCONATE CLOTH 2 % EX PADS
6.0000 | MEDICATED_PAD | Freq: Every day | CUTANEOUS | Status: DC
  Administered 2014-05-21 – 2014-05-23 (×3): 6 via TOPICAL

## 2014-05-21 MED ORDER — FENTANYL CITRATE (PF) 100 MCG/2ML IJ SOLN
INTRAMUSCULAR | Status: AC
  Filled 2014-05-21: qty 2

## 2014-05-21 MED ORDER — SODIUM CHLORIDE 0.9 % IV SOLN
INTRAVENOUS | Status: AC
  Administered 2014-05-21: 15:00:00 via INTRAVENOUS

## 2014-05-21 MED ORDER — MUPIROCIN 2 % EX OINT
1.0000 "application " | TOPICAL_OINTMENT | Freq: Two times a day (BID) | CUTANEOUS | Status: DC
  Administered 2014-05-21 – 2014-05-23 (×5): 1 via NASAL
  Filled 2014-05-21: qty 22

## 2014-05-21 MED ORDER — SENNOSIDES-DOCUSATE SODIUM 8.6-50 MG PO TABS
1.0000 | ORAL_TABLET | Freq: Two times a day (BID) | ORAL | Status: DC
  Administered 2014-05-21 – 2014-05-23 (×2): 1 via ORAL
  Filled 2014-05-21 (×4): qty 1

## 2014-05-21 MED ORDER — BUPIVACAINE HCL (PF) 0.25 % IJ SOLN
INTRAMUSCULAR | Status: AC
  Filled 2014-05-21: qty 30

## 2014-05-21 MED ORDER — HYDRALAZINE HCL 20 MG/ML IJ SOLN
10.0000 mg | Freq: Four times a day (QID) | INTRAMUSCULAR | Status: DC | PRN
  Administered 2014-05-21: 10 mg via INTRAVENOUS
  Filled 2014-05-21: qty 1

## 2014-05-21 MED ORDER — FENTANYL CITRATE (PF) 100 MCG/2ML IJ SOLN
INTRAMUSCULAR | Status: AC | PRN
  Administered 2014-05-21 (×2): 25 ug via INTRAVENOUS

## 2014-05-21 MED ORDER — MIDAZOLAM HCL 2 MG/2ML IJ SOLN
INTRAMUSCULAR | Status: AC
  Filled 2014-05-21: qty 2

## 2014-05-21 MED ORDER — POLYETHYLENE GLYCOL 3350 17 G PO PACK
17.0000 g | PACK | Freq: Every day | ORAL | Status: DC | PRN
  Filled 2014-05-21: qty 1

## 2014-05-21 MED ORDER — HYDROMORPHONE HCL 1 MG/ML IJ SOLN
INTRAMUSCULAR | Status: AC | PRN
  Administered 2014-05-21: 1 mg via INTRAVENOUS

## 2014-05-21 NOTE — Progress Notes (Signed)
1531: BP 210/90 manual HR 85. Patient lethargic, responsive to voice post procedure. Dr. Eliseo Squires notified. PRN medication ordered for BP.

## 2014-05-21 NOTE — Progress Notes (Signed)
Family notified RN that they are concerned for patient's safety at home because he lives only with his wife who has alzheimers. Family reassured that a PT/OT evaluation will occur and they will assess patient's needs and determine if it is safe for patient to go back home.

## 2014-05-21 NOTE — Progress Notes (Signed)
PROGRESS NOTE  Scott Mooney YYT:[id number] DOB: February 26, 1929 DOA: 05/19/2014 PCP: Donnajean Lopes, MD  Assessment/Plan: Compression fracture of L4 lumbar vertebra-  -iv dilaudid and po norco for pain control -for kyphoplasty today   Chronic diastolic heart failure- stable   Adrenal insufficiency- stable, resume home meds of cortef daily   Acute low back pain- As above  Constipation -PRN miralax -sennakot   Code Status: full Family Communication: patient/wife at bedside Disposition Plan:    Consultants:  IR  Procedures:      HPI/Subjective: C/o constipation  Objective: Filed Vitals:   05/21/14 0455  BP: 121/49  Pulse: 70  Temp: 98.2 F (36.8 C)  Resp: 18    Intake/Output Summary (Last 24 hours) at 05/21/14 0957 Last data filed at 05/21/14 0836  Gross per 24 hour  Intake   1016 ml  Output      0 ml  Net   1016 ml   Filed Weights   05/19/14 1812 05/20/14 0539 05/21/14 0604  Weight: 90 kg (198 lb 6.6 oz) 90.7 kg (199 lb 15.3 oz) 94.6 kg (208 lb 8.9 oz)    Exam:   General:  A+Ox3, NAD  Cardiovascular: rrr  Respiratory: clear  Abdomen: +BS, soft  Musculoskeletal: no edema  Data Reviewed: Basic Metabolic Panel:  Recent Labs Lab 05/19/14 1415  NA 137  K 3.8  CL 101  CO2 24  GLUCOSE 108*  BUN 16  CREATININE 1.24  CALCIUM 8.8*   Liver Function Tests:  Recent Labs Lab 05/19/14 1415  AST 25  ALT 12*  ALKPHOS 99  BILITOT 1.6*  PROT 7.2  ALBUMIN 3.8   No results for input(s): LIPASE, AMYLASE in the last 168 hours. No results for input(s): AMMONIA in the last 168 hours. CBC:  Recent Labs Lab 05/19/14 1415  WBC 8.6  NEUTROABS 7.5  HGB 17.0  HCT 50.8  MCV 93.6  PLT 153   Cardiac Enzymes: No results for input(s): CKTOTAL, CKMB, CKMBINDEX, TROPONINI in the last 168 hours. BNP (last 3 results) No results for input(s): BNP in the last 8760 hours.  ProBNP (last 3 results) No results for input(s): PROBNP in the  last 8760 hours.  CBG: No results for input(s): GLUCAP in the last 168 hours.  Recent Results (from the past 240 hour(s))  Surgical PCR screen     Status: Abnormal   Collection Time: 05/21/14  7:11 AM  Result Value Ref Range Status   MRSA, PCR NEGATIVE NEGATIVE Final   Staphylococcus aureus POSITIVE (A) NEGATIVE Final    Comment:        The Xpert SA Assay (FDA approved for NASAL specimens in patients over 12 years of age), is one component of a comprehensive surveillance program.  Test performance has been validated by [facility] for patients greater than or equal to 78 year old. It is not intended to diagnose infection nor to guide or monitor treatment.      Studies: Dg Chest Port 1 View  05/19/2014   CLINICAL DATA:  81 year old male with a history of cough. No chest pain  EXAM: PORTABLE CHEST - 1 VIEW  COMPARISON:  07/09/2013, 07/14/2012  FINDINGS: Cardiomediastinal silhouette unchanged with borderline cardiomegaly. Atherosclerotic calcifications of the aortic arch.  Calcifications in the left hilar region representing calcified lymph nodes, present on prior CT. Surgical changes of the right upper lung.  Low lung volumes accentuates the interstitium.  Interstitial opacities throughout bilateral lungs, present on comparison studies. No confluent airspace disease. No  large pleural effusion.  Continuing asymmetric elevation of the right hemidiaphragm. Left base not well evaluated.  No displaced fracture.  IMPRESSION: Low lung volumes with chronic lung changes and no evidence of lobar pneumonia.  Atherosclerosis.  Surgical changes of the right upper lung.  Signed,  Dulcy Fanny. Earleen Newport, DO  [facility]  [facility]   Electronically Signed   By: Corrie Mckusick D.O.   On: 05/19/2014 17:41   Ct Renal Stone Study  05/19/2014   CLINICAL DATA:  Lumbago/low back pain for 4 days. Nausea and vomiting. History of kidney stones.  EXAM: CT ABDOMEN AND PELVIS  WITHOUT CONTRAST  TECHNIQUE: Multidetector CT imaging of the abdomen and pelvis was performed following the standard protocol without IV contrast.  COMPARISON:  09/08/2012.  02/14/2009.  FINDINGS: Musculoskeletal: Acute or subacute L4 inferior endplate compression fracture. Fracture plane remains crisp along the inferior endplate. Loss of vertebral body height is about 40%. Mild retropulsion. Chronic T12 compression fracture.  Lung Bases: Dependent atelectasis.  Liver: Micronodular contour of the liver with enlargement of the LEFT hepatic lobe compatible with hepatic cirrhosis. Unenhanced CT was performed per clinician order. Lack of IV contrast limits sensitivity and specificity, especially for evaluation of abdominal/pelvic solid viscera. Tiny posterior RIGHT hepatic lobe cyst is unchanged compared to 2011.  Spleen:  Splenomegaly with old granulomatous disease.  Gallbladder:  Surgically absent.  Common bile duct:  Normal.  Pancreas:  Normal.  Adrenal glands:  Normal bilaterally.  Kidneys: Bilateral renal cysts and renal cortical atrophy. Punctate nonobstructing LEFT inferior pole renal collecting system calculus. Punctate RIGHT renal collecting system calculus versus vascular calcification. No hydronephrosis. LEFT ureter appears normal. RIGHT ureter normal.  Stomach:  Small to moderate hiatal hernia.  Small bowel:  Grossly normal.  Colon:   Diverticulosis without diverticulitis.  Pelvic Genitourinary: Prostate calcifications. Urinary bladder normal.  Peritoneum: No free fluid. Small bowel mesenteric fat stranding is present, probably secondary to cirrhosis. Prominent vessels are present in the anterior abdomen, likely secondary to portal venous hypertension.  Vascular/lymphatic: Atherosclerosis without an acute vascular abnormality by noncontrast CT. Coronary artery atherosclerosis is present. If office based assessment of coronary risk factors has not been performed, it is now recommended.  Body Wall: Normal.   IMPRESSION: 1. Acute or subacute L4 inferior endplate compression fracture with mild retropulsion. Chronic T12 compression fracture. 2. Renal cysts. 3. Hepatic cirrhosis and splenomegaly consistent with portal venous hypertension. 4. Cholecystectomy. 5. Hepatic and renal cysts. 6. Small moderate hiatal hernia. 7. Atherosclerosis and coronary artery disease. 8. Punctate nonobstructing renal collecting system calculi versus vascular calcifications.   Electronically Signed   By: Dereck Ligas M.D.   On: 05/19/2014 15:59    Scheduled Meds: . Chlorhexidine Gluconate Cloth  6 each Topical Daily  . hydrocortisone  20 mg Oral Daily  . levothyroxine  125 mcg Oral q morning - 10a  . methocarbamol  500 mg Oral QHS  . mupirocin ointment  1 application Nasal BID  . ranolazine  500 mg Oral BID  . sodium chloride  3 mL Intravenous Q12H  . vancomycin  1,000 mg Intravenous To 283   Continuous Infusions:  Antibiotics Given (last 72 hours)    None      Principal Problem:   Compression fracture of L4 lumbar vertebra Active Problems:   Chronic diastolic heart failure   Adrenal insufficiency   Acute low back pain    Time spent: 25 min    Leelah Hanna  [facility]  Pager [phone]. If 7PM-7AM, please contact night-coverage at [email or web address], password [facility] 05/21/2014, 9:57 AM

## 2014-05-21 NOTE — Procedures (Signed)
S/P L4 KP

## 2014-05-21 NOTE — Discharge Instructions (Signed)
1.No stooping,bending or lifting moret than 10 lbs for 2 weeks. 2.Use walker to ambulate for  2 weeks. 3.RTC in 2 weeks

## 2014-05-21 NOTE — Progress Notes (Signed)
OT Cancellation Note  Patient Details Name: Scott Mooney MRN: [id number] DOB: 1929/06/03   Cancelled Treatment:    Reason Eval/Treat Not Completed: Patient not medically ready. Planned kyphoplasty today. Will plan to see tomorrow for OT evaluation.  Benito Mccreedy OTR/L [phone] 05/21/2014, 10:29 AM

## 2014-05-21 NOTE — Progress Notes (Signed)
PT Cancellation Note  Patient Details Name: Scott Mooney MRN: [id number] DOB: [birth year]   Cancelled Treatment:    Reason Eval/Treat Not Completed: Patient not medically ready; planned kyphoplasty today.  RN states needs to see PT following procedure.  Will see tomorrow for PT eval.     Jayden Rudge,CYNDI 05/21/2014, 9:55 AM  Magda Kiel, PT [phone] 05/21/2014

## 2014-05-22 DIAGNOSIS — I5032 Chronic diastolic (congestive) heart failure: Secondary | ICD-10-CM | POA: Diagnosis not present

## 2014-05-22 DIAGNOSIS — S32040A Wedge compression fracture of fourth lumbar vertebra, initial encounter for closed fracture: Secondary | ICD-10-CM | POA: Diagnosis not present

## 2014-05-22 DIAGNOSIS — M545 Low back pain: Secondary | ICD-10-CM | POA: Diagnosis not present

## 2014-05-22 DIAGNOSIS — E2740 Unspecified adrenocortical insufficiency: Secondary | ICD-10-CM | POA: Diagnosis not present

## 2014-05-22 MED ORDER — BISACODYL 10 MG RE SUPP
10.0000 mg | Freq: Every day | RECTAL | Status: DC | PRN
Start: 2014-05-22 — End: 2014-05-23

## 2014-05-22 MED ORDER — ACETAMINOPHEN 325 MG PO TABS
650.0000 mg | ORAL_TABLET | Freq: Four times a day (QID) | ORAL | Status: DC | PRN
  Administered 2014-05-22 – 2014-05-23 (×2): 650 mg via ORAL
  Filled 2014-05-22 (×2): qty 2

## 2014-05-22 NOTE — Evaluation (Signed)
Physical Therapy Evaluation Patient Details Name: Scott Mooney MRN: [id number] DOB: Jun 07, 1929 Today's Date: 05/22/2014   History of Present Illness  Scott Mooney is a 81 y.o. male admitted with uncontrolled low back pain  Pt underwent L4 kyhphoplasty 5/10  Clinical Impression  Patient presents with decreased mobility due to pain and issues listed in PT problem list.  He will benefit from skilled PT in the acute setting to allow return home with assist following SNF rehab stay.    Follow Up Recommendations SNF    Equipment Recommendations  None recommended by PT    Recommendations for Other Services       Precautions / Restrictions Precautions Precautions: Back      Mobility  Bed Mobility Overal bed mobility: Needs Assistance Bed Mobility: Rolling;Sidelying to Sit;Sit to Supine Rolling: Min assist Sidelying to sit: Mod assist   Sit to supine: Min guard   General bed mobility comments: cues and assist for back precautions; assist for feet off bed and trunk upright, then to supine cues for technique, but pt still moved to supine rather than to sidelying  Transfers Overall transfer level: Needs assistance Equipment used: Rolling walker (2 wheeled) Transfers: Sit to/from [facility] Sit to Stand: Mod assist Stand pivot transfers: Min assist       General transfer comment: assist to stand to walker; then to pivot to [facility]  Ambulation/Gait Ambulation/Gait assistance: Min assist Ambulation Distance (Feet): 80 Feet Assistive device: Rolling walker (2 wheeled) Gait Pattern/deviations: Step-through pattern;Decreased stride length;Shuffle;Trunk flexed     General Gait Details: Patient able to walk in room then little in hall   Stairs            Wheelchair Mobility    Modified Rankin (Stroke Patients Only)       Balance Overall balance assessment: Needs assistance         Standing balance support: Bilateral upper extremity  supported Standing balance-Leahy Scale: Poor Standing balance comment: needs UE support with walker, at least supervision for safety                             Pertinent Vitals/Pain Pain Assessment: Faces Faces Pain Scale: Hurts whole lot Pain Location: back with bed mobility (reports less pain with ambulation) Pain Descriptors / Indicators: Aching Pain Intervention(s): Monitored during session    Home Living Family/patient expects to be discharged to:: Skilled nursing facility Living Arrangements: Spouse/significant other               Additional Comments: wife with Alzeimer's dementia; son, nephew help and now wife's sister here to help    Prior Function Level of Independence: Independent with assistive device(s)         Comments: was needing assist for ADL's for couple of weeks     Hand Dominance        Extremity/Trunk Assessment               Lower Extremity Assessment: Generalized weakness      Cervical / Trunk Assessment: Kyphotic  Communication   Communication: No difficulties  Cognition Arousal/Alertness: Lethargic;Suspect due to medications Behavior During Therapy: Flat affect Overall Cognitive Status: Impaired/Different from baseline Area of Impairment: Problem solving             Problem Solving: Slow processing;Decreased initiation;Difficulty sequencing;Requires verbal cues;Requires tactile cues      General Comments      Exercises  Assessment/Plan    PT Assessment Patient needs continued PT services  PT Diagnosis Difficulty walking;Acute pain;Generalized weakness   PT Problem List Decreased strength;Decreased mobility;Decreased balance;Pain;Decreased knowledge of use of DME;Decreased activity tolerance  PT Treatment Interventions DME instruction;Gait training;Functional mobility training;Therapeutic activities;Patient/family education;Therapeutic exercise;Balance training   PT Goals (Current goals can be  found in the Care Plan section) Acute Rehab PT Goals Patient Stated Goal: help pain PT Goal Formulation: With patient/family Time For Goal Achievement: 06/05/14 Potential to Achieve Goals: Good    Frequency Min 3X/week   Barriers to discharge   wife with Alzheimer's; sister in law here to help    Co-evaluation               End of Session Equipment Utilized During Treatment: Gait belt Activity Tolerance: Patient limited by pain Patient left: in bed;with call bell/phone within reach      Functional Assessment Tool Used: Clinical Judgement Functional Limitation: Mobility: Walking and moving around Mobility: Walking and Moving Around Current Status [phone]): At least 40 percent but less than 60 percent impaired, limited or restricted Mobility: Walking and Moving Around Goal Status [phone]): At least 20 percent but less than 40 percent impaired, limited or restricted    Time: 1259-1332 PT Time Calculation (min) (ACUTE ONLY): 33 min   Charges:   PT Evaluation $Initial PT Evaluation Tier I: 1 Procedure PT Treatments $Gait Training: 8-22 mins   PT G Codes:   PT G-Codes **NOT FOR INPATIENT CLASS** Functional Assessment Tool Used: Clinical Judgement Functional Limitation: Mobility: Walking and moving around Mobility: Walking and Moving Around Current Status (F0277): At least 40 percent but less than 60 percent impaired, limited or restricted Mobility: Walking and Moving Around Goal Status [phone]): At least 20 percent but less than 40 percent impaired, limited or restricted    [facility] 05/22/2014, 4:13 PM  [place], [place] 05/22/2014

## 2014-05-22 NOTE — Progress Notes (Signed)
Subjective: Pt s/p L4 KP yesterday. Says pain is somewhat better this am, but hard to tell as he hasn't tried to move around yet.  Objective: Physical Exam: BP 115/35 mmHg  Pulse 73  Temp(Src) 98.4 F (36.9 C) (Oral)  Resp 18  Ht 5\' 4"  (1.626 m)  Wt 208 lb 8.9 oz (94.6 kg)  BMI 35.78 kg/m2  SpO2 91% Procedure site clean, soft, no hematoma, minimal tenderness.   Labs: CBC  Recent Labs  05/19/14 1415  WBC 8.6  HGB 17.0  HCT 50.8  PLT 153   BMET  Recent Labs  05/19/14 1415  NA 137  K 3.8  CL 101  CO2 24  GLUCOSE 108*  BUN 16  CREATININE 1.24  CALCIUM 8.8*   LFT  Recent Labs  05/19/14 1415  PROT 7.2  ALBUMIN 3.8  AST 25  ALT 12*  ALKPHOS 99  BILITOT 1.6*   PT/INR  Recent Labs  05/21/14 0539  LABPROT 15.9*  INR 1.26     Studies/Results: No results found.  Assessment/Plan: L4 compression fx S/p L4 KP 5/10 Ok to work with PT, needs RW at all times for at least 2 weeks.      Ascencion Dike PA-C 05/22/2014 8:40 AM

## 2014-05-22 NOTE — Progress Notes (Addendum)
PROGRESS NOTE  PROSPER PAFF OYD:[id number] DOB: 1929-01-14 DOA: 05/19/2014 PCP: Donnajean Lopes, MD  Assessment/Plan: Compression fracture of L4 lumbar vertebra-  -tylenol for pain control -s/p kyphoplasty- PT eval pending- need RW for at least 2 weeks   Chronic diastolic heart failure- stable   Adrenal insufficiency- stable, resume home meds of cortef daily   Acute low back pain- As above  Constipation -PRN miralax -sennakot -suppository  Code Status: full Family Communication: patient/wife at bedside 5/10- called son Disposition Plan:    Consultants:  IR  Procedures:      HPI/Subjective: Appears more confused today- answering questions  Objective: Filed Vitals:   05/22/14 0554  BP: 115/35  Pulse: 73  Temp: 98.4 F (36.9 C)  Resp: 18    Intake/Output Summary (Last 24 hours) at 05/22/14 0925 Last data filed at 05/21/14 2240  Gross per 24 hour  Intake    355 ml  Output    100 ml  Net    255 ml   Filed Weights   05/19/14 1812 05/20/14 0539 05/21/14 0604  Weight: 90 kg (198 lb 6.6 oz) 90.7 kg (199 lb 15.3 oz) 94.6 kg (208 lb 8.9 oz)    Exam:   General:  Pleasant/ slower to respond today  Cardiovascular: rrr  Respiratory: clear  Abdomen: +BS, soft  Musculoskeletal: no edema  Data Reviewed: Basic Metabolic Panel:  Recent Labs Lab 05/19/14 1415  NA 137  K 3.8  CL 101  CO2 24  GLUCOSE 108*  BUN 16  CREATININE 1.24  CALCIUM 8.8*   Liver Function Tests:  Recent Labs Lab 05/19/14 1415  AST 25  ALT 12*  ALKPHOS 99  BILITOT 1.6*  PROT 7.2  ALBUMIN 3.8   No results for input(s): LIPASE, AMYLASE in the last 168 hours. No results for input(s): AMMONIA in the last 168 hours. CBC:  Recent Labs Lab 05/19/14 1415  WBC 8.6  NEUTROABS 7.5  HGB 17.0  HCT 50.8  MCV 93.6  PLT 153   Cardiac Enzymes: No results for input(s): CKTOTAL, CKMB, CKMBINDEX, TROPONINI in the last 168 hours. BNP (last 3 results) No results for  input(s): BNP in the last 8760 hours.  ProBNP (last 3 results) No results for input(s): PROBNP in the last 8760 hours.  CBG: No results for input(s): GLUCAP in the last 168 hours.  Recent Results (from the past 240 hour(s))  Surgical PCR screen     Status: Abnormal   Collection Time: 05/21/14  7:11 AM  Result Value Ref Range Status   MRSA, PCR NEGATIVE NEGATIVE Final   Staphylococcus aureus POSITIVE (A) NEGATIVE Final    Comment:        The Xpert SA Assay (FDA approved for NASAL specimens in patients over 79 years of age), is one component of a comprehensive surveillance program.  Test performance has been validated by [facility] for patients greater than or equal to 79 year old. It is not intended to diagnose infection nor to guide or monitor treatment.      Studies: No results found.  Scheduled Meds: . Chlorhexidine Gluconate Cloth  6 each Topical Daily  . hydrocortisone  20 mg Oral Daily  . levothyroxine  125 mcg Oral q morning - 10a  . methocarbamol  500 mg Oral QHS  . mupirocin ointment  1 application Nasal BID  . ranolazine  500 mg Oral BID  . senna-docusate  1 tablet Oral BID  . sodium chloride  3 mL Intravenous Q12H  Continuous Infusions:  Antibiotics Given (last 72 hours)    Date/Time Action Medication Dose Rate   05/21/14 1358 Given   vancomycin (VANCOCIN) IVPB 1000 mg/200 mL premix 1,000 mg 200 mL/hr      Principal Problem:   Compression fracture of L4 lumbar vertebra Active Problems:   Chronic diastolic heart failure   Adrenal insufficiency   Acute low back pain    Time spent: 25 min    Shanira Tine, [facility] Pager [phone]. If 7PM-7AM, please contact night-coverage at [email or web address], password [facility] 05/22/2014, 9:25 AM

## 2014-05-22 NOTE — Clinical Social Work Note (Cosign Needed)
Clinical Social Work Assessment  Patient Details  Name: Scott Mooney MRN: [id number] Date of Birth: 12-02-1929  Date of referral:  05/22/14               Reason for consult:  Rule-out Psychosocial                Permission sought to share information with:  Family Supports Permission granted to share information::  Yes, Verbal Permission Granted  Name::     Scott Mooney::     Relationship::  Niece  Contact Information:  [phone]  Housing/Transportation Living arrangements for the past 2 months:  Single Family Home Source of Information:  Other (Comment Required) (Healthcare POA-Scott Designer, fashion/clothing (niece)) Patient Interpreter Needed:  None Criminal Activity/Legal Involvement Pertinent to Current Situation/Hospitalization:  No - Comment as needed Significant Relationships:  Other Family Members Lives with:  Siblings, Spouse (Spouse, Sister-In Law) Do you feel safe going back to the place where you live?  Yes Need for family participation in patient care:  Yes (Comment)  Care giving concerns:  Family /Patient not agreeable to SNF. Want to go home with home health and family support.   Social Worker assessment / plan:  CSW Intern made telephone call to Scott Mooney, [phone] to discuss plan for discharge. Scott Mooney states pt is not in agreement with going to a SNF for rehab and states pt wants to come home with home health. Scott Mooney states there is plenty of family support living in the home (spouse and sister-in-law) and that are "in and out" of the home (Scott Mooney,  Pt's son, and other family). Scott Mooney states they are willing to hire a private duty nurse, if needed. Scott Mooney appreciative of CSW intern support and engaged during assessment.   Employment status:  Retired Forensic scientist:    PT Recommendations:  [place] / Referral to community resources:  [place]  Patient/Family's  Response to care:  Pt confused and disoriented. CSW Intern spoke with Healthcare POA, Scott Mooney. Scott Mooney and pt (per Scott Mooney) was not in agreement with recommendation for SNF discharge for rehab. Requesting home health instead.   Patient/Family's Understanding of and Emotional Response to Diagnosis, Current Treatment, and Prognosis:  Scott Mooney/pt agreeable to discharge home with home health.   Emotional Assessment Appearance:  Other (Comment Required (Spoke with niece) Attitude/Demeanor/Rapport:  Unable to Assess Affect (typically observed):  Unable to Assess Orientation:    Alcohol / Substance use:  Never Used Psych involvement (Current and /or in the community):  No (Comment)  Discharge Needs  Concerns to be addressed:  No discharge needs identified Readmission within the last 30 days:  No Current discharge risk:  None Barriers to Discharge:  No Barriers Identified   Truitt Merle, Student-SW 05/22/2014, 8:41 PM

## 2014-05-22 NOTE — Evaluation (Signed)
Occupational Therapy Evaluation Patient Details Name: Scott Mooney MRN: [id number] DOB: 1929/10/19 Today's Date: 05/22/2014    History of Present Illness Scott Mooney is a 81 y.o. male admitted with uncontrolled low back pain  Pt underwent L4 kyhphoplasty 5/10   Clinical Impression   Pt admitted with back pain. Pt currently with functional limitations due to the deficits listed below (see OT Problem List).  Pt will benefit from skilled OT to increase their safety and independence with ADL and functional mobility for ADL to facilitate discharge to venue listed below.      Follow Up Recommendations  SNF    Equipment Recommendations  None recommended by OT    Recommendations for Other Services       Precautions / Restrictions Precautions Precautions: Back      Mobility Bed Mobility Overal bed mobility: Needs Assistance Bed Mobility: Rolling;Supine to Sit Rolling: Min assist   Supine to sit: Mod assist        Transfers Overall transfer level: Needs assistance               General transfer comment: pt unable to stand         ADL Overall ADL's : Needs assistance/impaired Eating/Feeding: Set up;Sitting   Grooming: Minimal assistance;Sitting   Upper Body Bathing: Minimal assitance;Sitting   Lower Body Bathing: Maximal assistance;Sitting/lateral leans   Upper Body Dressing : Minimal assistance;Sitting   Lower Body Dressing: Maximal assistance;Sitting/lateral leans                                 Pertinent Vitals/Pain Pain Assessment: 0-10 Pain Score: 10-Worst pain ever Pain Location: back Pain Descriptors / Indicators: Sore Pain Intervention(s): Limited activity within patient's tolerance;Monitored during session;Repositioned;Patient requesting pain meds-RN notified     Hand Dominance     Extremity/Trunk Assessment Upper Extremity Assessment Upper Extremity Assessment: Generalized weakness           Communication  Communication Communication: No difficulties   Cognition Arousal/Alertness: Awake/alert Behavior During Therapy: WFL for tasks assessed/performed Overall Cognitive Status: Within Functional Limits for tasks assessed                     General Comments    Pt will likely need ST SNF for rehab           Home Living Family/patient expects to be discharged to:: Skilled nursing facility Living Arrangements: Spouse/significant other                                      Prior Functioning/Environment Level of Independence: Independent with assistive device(s)             OT Diagnosis: Generalized weakness;Acute pain   OT Problem List: Decreased strength;Decreased activity tolerance;Decreased knowledge of use of DME or AE;Impaired balance (sitting and/or standing);Decreased knowledge of precautions;Pain   OT Treatment/Interventions: Self-care/ADL training;DME and/or AE instruction;Patient/family education    OT Goals(Current goals can be found in the care plan section) Acute Rehab OT Goals Patient Stated Goal: get better Time For Goal Achievement: 06/05/14 Potential to Achieve Goals: Good ADL Goals Pt Will Perform Grooming: with supervision;standing Pt Will Perform Lower Body Dressing: with min guard assist;sit to/from stand Pt Will Transfer to Toilet: with supervision;ambulating;regular height toilet;bedside commode Pt Will Perform Toileting - Clothing Manipulation and hygiene: with min guard assist;sit  to/from stand  OT Frequency: Min 2X/week   Barriers to D/C: Decreased caregiver support          Co-evaluation              End of Session Nurse Communication: Mobility status;Patient requests pain meds  Activity Tolerance: Patient limited by pain Patient left: in bed;with call bell/phone within reach;with bed alarm set   Time: 1135-1153 OT Time Calculation (min): 18 min Charges:  OT General Charges $OT Visit: 1 Procedure OT  Evaluation $Initial OT Evaluation Tier I: 1 Procedure G-Codes: OT G-codes **NOT FOR INPATIENT CLASS** Functional Assessment Tool Used: clinical observation Functional Limitation: Self care Self Care Current Status (X4128): At least 60 percent but less than 80 percent impaired, limited or restricted Self Care Goal Status (N8676): At least 20 percent but less than 40 percent impaired, limited or restricted  Scott Mooney 05/22/2014, 12:20 PM

## 2014-05-23 DIAGNOSIS — E2740 Unspecified adrenocortical insufficiency: Secondary | ICD-10-CM | POA: Diagnosis not present

## 2014-05-23 DIAGNOSIS — I5032 Chronic diastolic (congestive) heart failure: Secondary | ICD-10-CM | POA: Diagnosis not present

## 2014-05-23 DIAGNOSIS — S32040A Wedge compression fracture of fourth lumbar vertebra, initial encounter for closed fracture: Secondary | ICD-10-CM | POA: Diagnosis not present

## 2014-05-23 MED ORDER — HYDROCORTISONE 20 MG PO TABS: 20.0000 mg | ORAL_TABLET | Freq: Every day | ORAL | Status: AC

## 2014-05-23 MED ORDER — POLYVINYL ALCOHOL 1.4 % OP SOLN
1.0000 [drp] | OPHTHALMIC | Status: DC | PRN
  Administered 2014-05-23: 1 [drp] via OPHTHALMIC
  Filled 2014-05-23: qty 15

## 2014-05-23 MED ORDER — POLYETHYLENE GLYCOL 3350 17 G PO PACK: 17.0000 g | PACK | Freq: Every day | ORAL | Status: AC | PRN

## 2014-05-23 MED ORDER — SENNOSIDES-DOCUSATE SODIUM 8.6-50 MG PO TABS: 1.0000 | ORAL_TABLET | Freq: Two times a day (BID) | ORAL | Status: AC

## 2014-05-23 MED ORDER — ACETAMINOPHEN 325 MG PO TABS: 650.0000 mg | ORAL_TABLET | Freq: Four times a day (QID) | ORAL | Status: AC | PRN

## 2014-05-23 MED ORDER — BISACODYL 10 MG RE SUPP: 10.0000 mg | Freq: Every day | RECTAL | Status: AC | PRN

## 2014-05-23 NOTE — Progress Notes (Signed)
[place] notified of [place] order and DME

## 2014-05-23 NOTE — Discharge Summary (Signed)
Physician Discharge Summary  [name] TDV:[id number] DOB: 1929-04-03 DOA: 05/19/2014  PCP: Donnajean Lopes, MD  Admit date: 05/19/2014 Discharge date: 05/23/2014  Time spent: 35 minutes  Recommendations for Outpatient Follow-up:  RW at all times for at least 2 weeks Home health  Discharge Diagnoses:  Principal Problem:   Compression fracture of L4 lumbar vertebra Active Problems:   Chronic diastolic heart failure   Adrenal insufficiency   Acute low back pain   Discharge Condition: improved  Diet recommendation: cardiac  Filed Weights   05/20/14 0539 05/21/14 0604 05/23/14 0546  Weight: 90.7 kg (199 lb 15.3 oz) 94.6 kg (208 lb 8.9 oz) 93 kg (205 lb 0.4 oz)    History of present illness:  Scott Mooney is a 81 y.o. male admitted with uncontrolled low back pain 10/10 x 4 days, no known injury-receiving IV pain medication without relief 5/10, CT with new L4 compression fracture, chronic T12 compression fracture. The patient has a history of pituitary tumor and insufficiency on chronic steroids.IR consult for Vertebroplasty/Kyphoplasty. The patient denies any loss of bowel or bladder function, he denies any radiation of pain down legs. He does live at home with his spouse and at baseline ambulates without assistance. He denies any chest pain, shortness of breath or palpitations. He denies any active signs of bleeding or excessive bruising. He denies any recent fever or chills. The patient denies any history of sleep apnea or chronic oxygen use. He denies any known complications to sedation.   Hospital Course:  Compression fracture of L4 lumbar vertebra-  -tylenol for pain control -s/p kyphoplasty- need RW for at least 2 weeks- home healht   Chronic diastolic heart failure- stable   Adrenal insufficiency- stable, resume home meds of cortef daily   Acute low back pain- tylenol- avoid narcotics  Constipation -PRN  miralax -sennakot -suppository   Procedures:  kyphoplasty  Consultations:  IR  Discharge Exam: Filed Vitals:   05/23/14 0546  BP: 134/42  Pulse: 73  Temp: 97.5 F (36.4 C)  Resp: 18    General: feeling much better- up walking halls with PT   Discharge Instructions   Discharge Instructions    Diet - low sodium heart healthy    Complete by:  As directed      Discharge instructions    Complete by:  As directed   Rolling walker home health 3:1 bedside commode Bowel regimen Must use rolling walker for minimum of 2 weeks post kyphoplasty     Increase activity slowly    Complete by:  As directed           Current Discharge Medication List    START taking these medications   Details  acetaminophen (TYLENOL) 325 MG tablet Take 2 tablets (650 mg total) by mouth every 6 (six) hours as needed for mild pain.    bisacodyl (DULCOLAX) 10 MG suppository Place 1 suppository (10 mg total) rectally daily as needed for moderate constipation. Qty: 12 suppository, Refills: 0    polyethylene glycol (MIRALAX / GLYCOLAX) packet Take 17 g by mouth daily as needed for mild constipation. Qty: 14 each, Refills: 0    senna-docusate (SENOKOT-S) 8.6-50 MG per tablet Take 1 tablet by mouth 2 (two) times daily.      CONTINUE these medications which have CHANGED   Details  hydrocortisone (CORTEF) 20 MG tablet Take 1 tablet (20 mg total) by mouth daily.      CONTINUE these medications which have NOT CHANGED   Details  Emollient (AVEENO ADVANCED CARE EX) Apply 1 application topically daily.    esomeprazole (NEXIUM) 40 MG capsule Take 40 mg by mouth daily at 12 noon.    feeding supplement, ENSURE COMPLETE, (ENSURE COMPLETE) LIQD Take 237 mLs by mouth 2 (two) times daily between meals.    furosemide (LASIX) 40 MG tablet Take 2 tablets (80 mg total) by mouth daily. Qty: 60 tablet, Refills: 0    levothyroxine (SYNTHROID, LEVOTHROID) 125 MCG tablet Take 125 mcg by mouth every morning.     meloxicam (MOBIC) 7.5 MG tablet Take 7.5 mg by mouth 2 (two) times daily. For back pain    methocarbamol (ROBAXIN) 500 MG tablet Take 500 mg by mouth at bedtime.    ondansetron (ZOFRAN) 4 MG tablet Take 1 tablet (4 mg total) by mouth daily. Qty: 20 tablet, Refills: 0    potassium chloride SA (K-DUR,KLOR-CON) 20 MEQ tablet Take 2 tablets (40 mEq total) by mouth daily. Qty: 60 tablet, Refills: 3    ranolazine (RANEXA) 500 MG 12 hr tablet Take 1 tablet (500 mg total) by mouth 2 (two) times daily. Qty: 60 tablet, Refills: 1      STOP taking these medications     aspirin EC 81 MG tablet      Emollient (LUBRIDERM SERIOUSLY SENSITIVE) LOTN        Allergies  Allergen Reactions  . Penicillins Swelling and Other (See Comments)    Head and feet swelling, spent 5 days in hospital as a result of taking med   Follow-up Information    Follow up with Donnajean Lopes, MD In 1 week.   Specialty:  Internal Medicine   Contact information:   [address] [place] [address] [phone]        The results of significant diagnostics from this hospitalization (including imaging, microbiology, ancillary and laboratory) are listed below for reference.    Significant Diagnostic Studies: Dg Chest Port 1 View  05/19/2014   CLINICAL DATA:  81 year old male with a history of cough. No chest pain  EXAM: PORTABLE CHEST - 1 VIEW  COMPARISON:  07/09/2013, 07/14/2012  FINDINGS: Cardiomediastinal silhouette unchanged with borderline cardiomegaly. Atherosclerotic calcifications of the aortic arch.  Calcifications in the left hilar region representing calcified lymph nodes, present on prior CT. Surgical changes of the right upper lung.  Low lung volumes accentuates the interstitium.  Interstitial opacities throughout bilateral lungs, present on comparison studies. No confluent airspace disease. No large pleural effusion.  Continuing asymmetric elevation of the right hemidiaphragm. Left base not well  evaluated.  No displaced fracture.  IMPRESSION: Low lung volumes with chronic lung changes and no evidence of lobar pneumonia.  Atherosclerosis.  Surgical changes of the right upper lung.  Signed,  Dulcy Fanny. Earleen Newport, DO  [facility]  [facility]   Electronically Signed   By: Corrie Mckusick D.O.   On: 05/19/2014 17:41   Ir Kypho Vertebral Lumbar Augmentation  05/22/2014   CLINICAL DATA:  Back pain.  Compression fracture L4.  EXAM: KYPHOPLASTY AT L4  PROCEDURE: Following a full explanation of the procedure along with the potential associated complications, an informed witnessed consent was obtained.  The patient was placed prone on the fluoroscopic table. The skin overlying the lumbar region was then prepped and draped in the usual sterile fashion. The right pedicle at L4 was then infiltrated with 0.25% bupivacaine followed by the advancement of a 10.5 gauge DFine needle through the right pedicle into the posterior one-third at L4.  Through this, a DFine core biopsy needle was then advanced within 5 mm of the anterior aspect of L4. Using a 20 mL syringe, a core biopsy was then obtained and sent for pathologic analysis.  Through the working needle, a DFine void creating device was then advanced and manipulated in different directions to create a void using biplane intermittent fluoroscopy.  This was then removed. The needle was then advanced to the junction of the middle and the anterior one-third at L4.  At this time a methylmethacrylate mixture was reconstituted with tobramycin in the DFine mixing system. This was then loaded onto the injector device. The injector device was then locked into position at the hub of the working needle.  Using biplane intermittent fluoroscopy, pulsed delivery of the methylmethacrylate mixture was then undertaken with excellent filling in the AP and lateral projections. There was no extravasation noted into the disc spaces or posteriorly into  the spinal canal. No paraspinous venous extension was noted by either.  The working cannula with the injector device was then retrieved and removed. Hemostasis was achieved at the skin entry site.  The patient tolerated the procedure well. There were no acute complications.  Medications utilized: Versed 1 mg IV and Fentanyl 50 mcg micrograms IV. Dilaudid 1 mg IV.  Total Moderate Sedation Time:  45 minutes.  IMPRESSION: 1. Status post fluoroscopic-guided needle placement for deep core bone biopsy at L4.  2. Status post vertebral body augmentation using DFine kyphoplasty technique at L4 as described without event.   Electronically Signed   By: Luanne Bras M.D.   On: 05/21/2014 14:55   Ct Renal Stone Study  05/19/2014   CLINICAL DATA:  Lumbago/low back pain for 4 days. Nausea and vomiting. History of kidney stones.  EXAM: CT ABDOMEN AND PELVIS WITHOUT CONTRAST  TECHNIQUE: Multidetector CT imaging of the abdomen and pelvis was performed following the standard protocol without IV contrast.  COMPARISON:  09/08/2012.  02/14/2009.  FINDINGS: Musculoskeletal: Acute or subacute L4 inferior endplate compression fracture. Fracture plane remains crisp along the inferior endplate. Loss of vertebral body height is about 40%. Mild retropulsion. Chronic T12 compression fracture.  Lung Bases: Dependent atelectasis.  Liver: Micronodular contour of the liver with enlargement of the LEFT hepatic lobe compatible with hepatic cirrhosis. Unenhanced CT was performed per clinician order. Lack of IV contrast limits sensitivity and specificity, especially for evaluation of abdominal/pelvic solid viscera. Tiny posterior RIGHT hepatic lobe cyst is unchanged compared to 2011.  Spleen:  Splenomegaly with old granulomatous disease.  Gallbladder:  Surgically absent.  Common bile duct:  Normal.  Pancreas:  Normal.  Adrenal glands:  Normal bilaterally.  Kidneys: Bilateral renal cysts and renal cortical atrophy. Punctate nonobstructing LEFT  inferior pole renal collecting system calculus. Punctate RIGHT renal collecting system calculus versus vascular calcification. No hydronephrosis. LEFT ureter appears normal. RIGHT ureter normal.  Stomach:  Small to moderate hiatal hernia.  Small bowel:  Grossly normal.  Colon:   Diverticulosis without diverticulitis.  Pelvic Genitourinary: Prostate calcifications. Urinary bladder normal.  Peritoneum: No free fluid. Small bowel mesenteric fat stranding is present, probably secondary to cirrhosis. Prominent vessels are present in the anterior abdomen, likely secondary to portal venous hypertension.  Vascular/lymphatic: Atherosclerosis without an acute vascular abnormality by noncontrast CT. Coronary artery atherosclerosis is present. If office based assessment of coronary risk factors has not been performed, it is now recommended.  Body Wall: Normal.  IMPRESSION: 1. Acute or subacute L4 inferior endplate compression fracture with mild retropulsion. Chronic T12  compression fracture. 2. Renal cysts. 3. Hepatic cirrhosis and splenomegaly consistent with portal venous hypertension. 4. Cholecystectomy. 5. Hepatic and renal cysts. 6. Small moderate hiatal hernia. 7. Atherosclerosis and coronary artery disease. 8. Punctate nonobstructing renal collecting system calculi versus vascular calcifications.   Electronically Signed   By: Dereck Ligas M.D.   On: 05/19/2014 15:59    Microbiology: Recent Results (from the past 240 hour(s))  Surgical PCR screen     Status: Abnormal   Collection Time: 05/21/14  7:11 AM  Result Value Ref Range Status   MRSA, PCR NEGATIVE NEGATIVE Final   Staphylococcus aureus POSITIVE (A) NEGATIVE Final    Comment:        The Xpert SA Assay (FDA approved for NASAL specimens in patients over 72 years of age), is one component of a comprehensive surveillance program.  Test performance has been validated by [facility] for patients greater than or equal to 37 year old. It is not  intended to diagnose infection nor to guide or monitor treatment.      Labs: Basic Metabolic Panel:  Recent Labs Lab 05/19/14 1415  NA 137  K 3.8  CL 101  CO2 24  GLUCOSE 108*  BUN 16  CREATININE 1.24  CALCIUM 8.8*   Liver Function Tests:  Recent Labs Lab 05/19/14 1415  AST 25  ALT 12*  ALKPHOS 99  BILITOT 1.6*  PROT 7.2  ALBUMIN 3.8   No results for input(s): LIPASE, AMYLASE in the last 168 hours. No results for input(s): AMMONIA in the last 168 hours. CBC:  Recent Labs Lab 05/19/14 1415  WBC 8.6  NEUTROABS 7.5  HGB 17.0  HCT 50.8  MCV 93.6  PLT 153   Cardiac Enzymes: No results for input(s): CKTOTAL, CKMB, CKMBINDEX, TROPONINI in the last 168 hours. BNP: BNP (last 3 results) No results for input(s): BNP in the last 8760 hours.  ProBNP (last 3 results) No results for input(s): PROBNP in the last 8760 hours.  CBG: No results for input(s): GLUCAP in the last 168 hours.     SignedEulogio Bear  [facility] 05/23/2014, 10:47 AM

## 2014-05-23 NOTE — Progress Notes (Signed)
Occupational Therapy Treatment Patient Details Name: Scott Mooney MRN: [id number] DOB: May 17, 1929 Today's Date: 05/23/2014    History of present illness Scott Mooney is a 81 y.o. male admitted with uncontrolled low back pain  Pt underwent L4 kyhphoplasty 5/10   OT comments  Plan is home with 24/7 A and HHOT  Follow Up Recommendations  Home health OT;Supervision/Assistance - 24 hour    Equipment Recommendations  3 in 1 bedside comode;Other (comment) (RW)    Recommendations for Other Services      Precautions / Restrictions Precautions Precautions: Back Restrictions Weight Bearing Restrictions: No       Mobility Bed Mobility Overal bed mobility: Needs Assistance Bed Mobility: Rolling;Sidelying to Sit;Sit to Supine Rolling: Min assist Sidelying to sit: Min assist          Transfers Overall transfer level: Needs assistance Equipment used: Rolling walker (2 wheeled) Transfers: Sit to/from Stand Sit to Stand: Min guard Stand pivot transfers: Min guard       General transfer comment: assist to stand to walker; then to pivot to [facility]    Balance                                   ADL Overall ADL's : Needs assistance/impaired Eating/Feeding: Set up;Sitting   Grooming: Wash/dry face;Sitting   Upper Body Bathing: Set up;Sitting   Lower Body Bathing: Moderate assistance;Sit to/from stand       Lower Body Dressing: Moderate assistance;Sit to/from stand   Toilet Transfer: Min guard;BSC;RW;Cueing for Designer, television/film set and Hygiene: Minimal assistance;Sit to/from stand       Functional mobility during ADLs: Min guard General ADL Comments: son wants pt to DC home. Family will provide 24/7 A      Vision                     Perception     Praxis      Cognition   Behavior During Therapy: WFL for tasks assessed/performed Overall Cognitive Status: Within Functional Limits for tasks assessed                        Extremity/Trunk Assessment               Exercises     Shoulder Instructions       General Comments      Pertinent Vitals/ Pain       Pain Score: 3  Pain Descriptors / Indicators: Sore Pain Intervention(s): Limited activity within patient's tolerance;Monitored during session;Repositioned  Home Living                                          Prior Functioning/Environment              Frequency Min 2X/week     Progress Toward Goals  OT Goals(current goals can now be found in the care plan section)        Plan Discharge plan needs to be updated    Co-evaluation                 End of Session Equipment Utilized During Treatment: Rolling walker   Activity Tolerance Patient tolerated treatment well   Patient Left in chair;with call bell/phone within reach;with chair alarm set;with  family/visitor present   Nurse Communication Mobility status    Functional Assessment Tool Used: clinical observation Functional Limitation: Self care Self Care Current Status [phone]): At least 20 percent but less than 40 percent impaired, limited or restricted Self Care Goal Status (U0454): At least 1 percent but less than 20 percent impaired, limited or restricted   Time: 0935-1006 OT Time Calculation (min): 31 min  Charges: OT G-codes **NOT FOR INPATIENT CLASS** Functional Assessment Tool Used: clinical observation Functional Limitation: Self care Self Care Current Status (U9811): At least 20 percent but less than 40 percent impaired, limited or restricted Self Care Goal Status (B1478): At least 1 percent but less than 20 percent impaired, limited or restricted OT General Charges $OT Visit: 1 Procedure OT Treatments $Self Care/Home Management : 23-37 mins  Zakariyya Helfman, [name] 05/23/2014, 10:19 AM

## 2014-05-23 NOTE — Progress Notes (Addendum)
Scott Mooney to be D/Mooney'd Home per MD order.  Discussed with the patient and all questions fully answered. Patient refused walker stating he has one at home. Patient also refused bedside commode.   VSS. IV catheter discontinued intact. Site without signs and symptoms of complications. Dressing and pressure applied.  An After Visit Summary was printed and given to the patient. Patient received prescription.  D/Mooney education completed with patient/family including follow up instructions, medication list, d/Mooney activities limitations if indicated, with other d/Mooney instructions as indicated by MD - patient able to verbalize understanding, all questions fully answered.   Patient instructed to return to ED, call 911, or call MD for any changes in condition.   Patient escorted via [place], and D/Mooney home via private auto.  [place], Scott Mooney 05/23/2014 12:53 PM

## 2014-05-23 NOTE — Care Management Note (Signed)
Case Management Note  Patient Details  Name: NACHUM DEROSSETT MRN: [id number] Date of Birth: May 03, 1929  Subjective/Objective:                 From home, fell   Action/Plan:  Pt declined SNF home health resources initiated with [facility] Expected Discharge Date:                  Expected Discharge Plan:  [place]  In-House Referral:  Clinical Social Work  Discharge planning Services  CM Consult  Post Acute Care Choice:  Durable Medical Equipment, Home Health Choice offered to:  Patient  DME Arranged:  3-N-1, Walker rolling DME Agency:  [place]:  RN, PT, OT, Nurse's Aide [place] Agency:  Secretary  Status of Service:  Completed, signed off  Medicare Important Message Given:  No Date Medicare IM Given:    Medicare IM give by:    Date Additional Medicare IM Given:    Additional Medicare Important Message give by:     If discussed at [place] of Stay Meetings, dates discussed:    Additional Comments:  Carles Collet, RN 05/23/2014, 12:13 PM

## 2014-05-23 NOTE — Progress Notes (Addendum)
Physical Therapy Treatment Patient Details Name: Scott Mooney MRN: [id number] DOB: 05/09/1929 Today's Date: 05/23/2014    History of Present Illness Scott Mooney is a 81 y.o. male admitted with uncontrolled low back pain  Pt underwent L4 kyhphoplasty 5/10    PT Comments    Significant improvement with mobility today. Pt denied pain with activity. He walked 220' with RW without loss of balance. PT now recommending HHPT and RW rather than SNF.  From PT standpoint he is ready to DC home.  Follow Up Recommendations  Home health PT     Equipment Recommendations  Rolling walker with 5" wheels    Recommendations for Other Services       Precautions / Restrictions Precautions Precautions: Back Restrictions Weight Bearing Restrictions: No    Mobility  Bed Mobility Overal bed mobility: Needs Assistance Bed Mobility: Rolling;Sidelying to Sit;Sit to Supine Rolling: Min assist Sidelying to sit: Min assist       General bed mobility comments: NT-up in chair  Transfers Overall transfer level: Needs assistance Equipment used: Rolling walker (2 wheeled) Transfers: Sit to/from Stand Sit to Stand: Supervision Stand pivot transfers: Min guard       General transfer comment: cues for hand placement and positioning in RW  Ambulation/Gait Ambulation/Gait assistance: Supervision Ambulation Distance (Feet): 220 Feet   Gait Pattern/deviations: WFL(Within Functional Limits)   Gait velocity interpretation: at or above normal speed for age/gender     Stairs            Wheelchair Mobility    Modified Rankin (Stroke Patients Only)       Balance     Sitting balance-Leahy Scale: Good       Standing balance-Leahy Scale: Fair                      Cognition Arousal/Alertness: Awake/alert Behavior During Therapy: WFL for tasks assessed/performed Overall Cognitive Status: Within Functional Limits for tasks assessed                       Exercises      General Comments        Pertinent Vitals/Pain Pain Assessment: No/denies pain Pain Score: 3  Pain Descriptors / Indicators: Sore Pain Intervention(s): Limited activity within patient's tolerance;Monitored during session;Repositioned    Home Living                      Prior Function            PT Goals (current goals can now be found in the care plan section) Acute Rehab PT Goals Patient Stated Goal: to sit in his lounge chair and watch [place] PT Goal Formulation: With patient/family Time For Goal Achievement: 06/05/14 Potential to Achieve Goals: Good Progress towards PT goals: Progressing toward goals    Frequency  Min 3X/week    PT Plan Discharge plan needs to be updated    Co-evaluation             End of Session Equipment Utilized During Treatment: Gait belt Activity Tolerance: Patient tolerated treatment well Patient left: with call bell/phone within reach;in chair;with family/visitor present     Time: 1012-1032 PT Time Calculation (min) (ACUTE ONLY): 20 min  Charges:  $Gait Training: 8-22 mins                    G Codes:  Functional Assessment Tool Used: Clinical Judgement Mobility: Walking and Moving Around  Goal Status (C0919) At least 20 percent but less than 40 percent impaired, limited or restricted   Mobility: Walking and Moving Around Discharge Status [phone]): At least 1 percent but less than 20 percent impaired, limited or restricted   Philomena Doheny 05/23/2014, 10:37 AM [phone]

## 2014-05-28 ENCOUNTER — Inpatient Hospital Stay (HOSPITAL_COMMUNITY)
Admission: EM | Admit: 2014-05-28 | Discharge: 2014-06-12 | DRG: 870 | Disposition: E | Payer: PPO | Attending: Pulmonary Disease | Admitting: Pulmonary Disease

## 2014-05-28 ENCOUNTER — Inpatient Hospital Stay (HOSPITAL_COMMUNITY): Payer: PPO

## 2014-05-28 ENCOUNTER — Emergency Department (HOSPITAL_COMMUNITY): Payer: PPO

## 2014-05-28 ENCOUNTER — Encounter (HOSPITAL_COMMUNITY): Payer: Self-pay | Admitting: *Deleted

## 2014-05-28 DIAGNOSIS — A419 Sepsis, unspecified organism: Secondary | ICD-10-CM | POA: Diagnosis present

## 2014-05-28 DIAGNOSIS — Z452 Encounter for adjustment and management of vascular access device: Secondary | ICD-10-CM

## 2014-05-28 DIAGNOSIS — J189 Pneumonia, unspecified organism: Secondary | ICD-10-CM | POA: Diagnosis present

## 2014-05-28 DIAGNOSIS — Z515 Encounter for palliative care: Secondary | ICD-10-CM | POA: Diagnosis not present

## 2014-05-28 DIAGNOSIS — E860 Dehydration: Secondary | ICD-10-CM | POA: Diagnosis present

## 2014-05-28 DIAGNOSIS — J9601 Acute respiratory failure with hypoxia: Secondary | ICD-10-CM | POA: Insufficient documentation

## 2014-05-28 DIAGNOSIS — E876 Hypokalemia: Secondary | ICD-10-CM | POA: Diagnosis not present

## 2014-05-28 DIAGNOSIS — Z4659 Encounter for fitting and adjustment of other gastrointestinal appliance and device: Secondary | ICD-10-CM

## 2014-05-28 DIAGNOSIS — E039 Hypothyroidism, unspecified: Secondary | ICD-10-CM | POA: Diagnosis present

## 2014-05-28 DIAGNOSIS — R233 Spontaneous ecchymoses: Secondary | ICD-10-CM | POA: Diagnosis not present

## 2014-05-28 DIAGNOSIS — J9602 Acute respiratory failure with hypercapnia: Secondary | ICD-10-CM | POA: Diagnosis present

## 2014-05-28 DIAGNOSIS — E870 Hyperosmolality and hypernatremia: Secondary | ICD-10-CM | POA: Diagnosis not present

## 2014-05-28 DIAGNOSIS — N179 Acute kidney failure, unspecified: Secondary | ICD-10-CM | POA: Diagnosis present

## 2014-05-28 DIAGNOSIS — R6521 Severe sepsis with septic shock: Secondary | ICD-10-CM | POA: Diagnosis present

## 2014-05-28 DIAGNOSIS — I5032 Chronic diastolic (congestive) heart failure: Secondary | ICD-10-CM | POA: Diagnosis present

## 2014-05-28 DIAGNOSIS — K219 Gastro-esophageal reflux disease without esophagitis: Secondary | ICD-10-CM | POA: Diagnosis present

## 2014-05-28 DIAGNOSIS — Z01818 Encounter for other preprocedural examination: Secondary | ICD-10-CM

## 2014-05-28 DIAGNOSIS — Z66 Do not resuscitate: Secondary | ICD-10-CM

## 2014-05-28 DIAGNOSIS — E2740 Unspecified adrenocortical insufficiency: Secondary | ICD-10-CM | POA: Diagnosis present

## 2014-05-28 DIAGNOSIS — I129 Hypertensive chronic kidney disease with stage 1 through stage 4 chronic kidney disease, or unspecified chronic kidney disease: Secondary | ICD-10-CM | POA: Diagnosis present

## 2014-05-28 DIAGNOSIS — J9600 Acute respiratory failure, unspecified whether with hypoxia or hypercapnia: Secondary | ICD-10-CM | POA: Diagnosis not present

## 2014-05-28 DIAGNOSIS — Z79899 Other long term (current) drug therapy: Secondary | ICD-10-CM | POA: Diagnosis not present

## 2014-05-28 DIAGNOSIS — N183 Chronic kidney disease, stage 3 unspecified: Secondary | ICD-10-CM | POA: Diagnosis present

## 2014-05-28 DIAGNOSIS — R509 Fever, unspecified: Secondary | ICD-10-CM | POA: Diagnosis present

## 2014-05-28 DIAGNOSIS — Z9289 Personal history of other medical treatment: Secondary | ICD-10-CM

## 2014-05-28 DIAGNOSIS — Y95 Nosocomial condition: Secondary | ICD-10-CM | POA: Diagnosis present

## 2014-05-28 DIAGNOSIS — K760 Fatty (change of) liver, not elsewhere classified: Secondary | ICD-10-CM | POA: Diagnosis present

## 2014-05-28 DIAGNOSIS — R918 Other nonspecific abnormal finding of lung field: Secondary | ICD-10-CM

## 2014-05-28 DIAGNOSIS — I959 Hypotension, unspecified: Secondary | ICD-10-CM | POA: Insufficient documentation

## 2014-05-28 DIAGNOSIS — R4182 Altered mental status, unspecified: Secondary | ICD-10-CM | POA: Diagnosis not present

## 2014-05-28 DIAGNOSIS — R109 Unspecified abdominal pain: Secondary | ICD-10-CM | POA: Insufficient documentation

## 2014-05-28 DIAGNOSIS — Z87891 Personal history of nicotine dependence: Secondary | ICD-10-CM | POA: Diagnosis not present

## 2014-05-28 DIAGNOSIS — I10 Essential (primary) hypertension: Secondary | ICD-10-CM | POA: Diagnosis present

## 2014-05-28 DIAGNOSIS — I429 Cardiomyopathy, unspecified: Secondary | ICD-10-CM | POA: Diagnosis present

## 2014-05-28 DIAGNOSIS — K567 Ileus, unspecified: Secondary | ICD-10-CM | POA: Diagnosis not present

## 2014-05-28 DIAGNOSIS — S32040A Wedge compression fracture of fourth lumbar vertebra, initial encounter for closed fracture: Secondary | ICD-10-CM | POA: Diagnosis present

## 2014-05-28 DIAGNOSIS — R1011 Right upper quadrant pain: Secondary | ICD-10-CM | POA: Diagnosis not present

## 2014-05-28 DIAGNOSIS — G9341 Metabolic encephalopathy: Secondary | ICD-10-CM | POA: Diagnosis present

## 2014-05-28 DIAGNOSIS — G9340 Encephalopathy, unspecified: Secondary | ICD-10-CM

## 2014-05-28 DIAGNOSIS — I2510 Atherosclerotic heart disease of native coronary artery without angina pectoris: Secondary | ICD-10-CM | POA: Diagnosis present

## 2014-05-28 DIAGNOSIS — R0682 Tachypnea, not elsewhere classified: Secondary | ICD-10-CM

## 2014-05-28 DIAGNOSIS — Z9911 Dependence on respirator [ventilator] status: Secondary | ICD-10-CM | POA: Diagnosis not present

## 2014-05-28 DIAGNOSIS — D696 Thrombocytopenia, unspecified: Secondary | ICD-10-CM | POA: Insufficient documentation

## 2014-05-28 DIAGNOSIS — A408 Other streptococcal sepsis: Secondary | ICD-10-CM | POA: Diagnosis not present

## 2014-05-28 DIAGNOSIS — A770 Spotted fever due to Rickettsia rickettsii: Secondary | ICD-10-CM | POA: Insufficient documentation

## 2014-05-28 DIAGNOSIS — N170 Acute kidney failure with tubular necrosis: Secondary | ICD-10-CM | POA: Diagnosis not present

## 2014-05-28 LAB — URINALYSIS, ROUTINE W REFLEX MICROSCOPIC
Bilirubin Urine: NEGATIVE
GLUCOSE, UA: NEGATIVE mg/dL
KETONES UR: NEGATIVE mg/dL
LEUKOCYTES UA: NEGATIVE
Nitrite: NEGATIVE
PH: 6 (ref 5.0–8.0)
Protein, ur: NEGATIVE mg/dL
Specific Gravity, Urine: 1.014 (ref 1.005–1.030)
Urobilinogen, UA: 1 mg/dL (ref 0.0–1.0)

## 2014-05-28 LAB — COMPREHENSIVE METABOLIC PANEL
ALBUMIN: 3.1 g/dL — AB (ref 3.5–5.0)
ALT: 17 U/L (ref 17–63)
AST: 44 U/L — AB (ref 15–41)
Alkaline Phosphatase: 137 U/L — ABNORMAL HIGH (ref 38–126)
Anion gap: 11 (ref 5–15)
BUN: 20 mg/dL (ref 6–20)
CHLORIDE: 103 mmol/L (ref 101–111)
CO2: 24 mmol/L (ref 22–32)
CREATININE: 2.2 mg/dL — AB (ref 0.61–1.24)
Calcium: 8.7 mg/dL — ABNORMAL LOW (ref 8.9–10.3)
GFR calc Af Amer: 30 mL/min — ABNORMAL LOW (ref >60)
GFR calc non Af Amer: 26 mL/min — ABNORMAL LOW (ref >60)
Glucose, Bld: 94 mg/dL (ref 65–99)
Potassium: 3.9 mmol/L (ref 3.5–5.1)
Sodium: 138 mmol/L (ref 135–145)
TOTAL PROTEIN: 5.9 g/dL — AB (ref 6.5–8.1)
Total Bilirubin: 1.3 mg/dL — ABNORMAL HIGH (ref 0.3–1.2)

## 2014-05-28 LAB — I-STAT ARTERIAL BLOOD GAS, ED
ACID-BASE DEFICIT: 6 mmol/L — AB (ref 0.0–2.0)
Bicarbonate: 17.5 mEq/L — ABNORMAL LOW (ref 20.0–24.0)
O2 Saturation: 95 %
TCO2: 18 mmol/L (ref 0–100)
pCO2 arterial: 29.1 mmHg — ABNORMAL LOW (ref 35.0–45.0)
pH, Arterial: 7.386 (ref 7.350–7.450)
pO2, Arterial: 78 mmHg — ABNORMAL LOW (ref 80.0–100.0)

## 2014-05-28 LAB — CORTISOL: Cortisol, Plasma: 4.8 ug/dL

## 2014-05-28 LAB — PROCALCITONIN: PROCALCITONIN: 4.63 ng/mL

## 2014-05-28 LAB — I-STAT CG4 LACTIC ACID, ED
Lactic Acid, Venous: 2.93 mmol/L (ref 0.5–2.0)
Lactic Acid, Venous: 2.74 mmol/L (ref 0.5–2.0)

## 2014-05-28 LAB — CBC WITH DIFFERENTIAL/PLATELET
BASOS ABS: 0 10*3/uL (ref 0.0–0.1)
BASOS PCT: 1 % (ref 0–1)
Eosinophils Absolute: 0.1 10*3/uL (ref 0.0–0.7)
Eosinophils Relative: 2 % (ref 0–5)
HCT: 50.4 % (ref 39.0–52.0)
Hemoglobin: 17.5 g/dL — ABNORMAL HIGH (ref 13.0–17.0)
LYMPHS ABS: 0.9 10*3/uL (ref 0.7–4.0)
Lymphocytes Relative: 20 % (ref 12–46)
MCH: 31.6 pg (ref 26.0–34.0)
MCHC: 34.7 g/dL (ref 30.0–36.0)
MCV: 91.1 fL (ref 78.0–100.0)
Monocytes Absolute: 0.6 10*3/uL (ref 0.1–1.0)
Monocytes Relative: 13 % — ABNORMAL HIGH (ref 3–12)
NEUTROS ABS: 2.7 10*3/uL (ref 1.7–7.7)
NEUTROS PCT: 64 % (ref 43–77)
PLATELETS: 87 10*3/uL — AB (ref 150–400)
RBC: 5.53 MIL/uL (ref 4.22–5.81)
RDW: 15.4 % (ref 11.5–15.5)
WBC: 4.3 10*3/uL (ref 4.0–10.5)

## 2014-05-28 LAB — PROTIME-INR
INR: 1.26 (ref 0.00–1.49)
INR: 1.49 (ref 0.00–1.49)
PROTHROMBIN TIME: 18.2 s — AB (ref 11.6–15.2)
Prothrombin Time: 15.9 seconds — ABNORMAL HIGH (ref 11.6–15.2)

## 2014-05-28 LAB — URINE MICROSCOPIC-ADD ON

## 2014-05-28 LAB — LACTIC ACID, PLASMA
LACTIC ACID, VENOUS: 3.8 mmol/L — AB (ref 0.5–2.0)
Lactic Acid, Venous: 3.2 mmol/L (ref 0.5–2.0)

## 2014-05-28 LAB — MRSA PCR SCREENING: MRSA by PCR: NEGATIVE

## 2014-05-28 LAB — SEDIMENTATION RATE: SED RATE: 1 mm/h (ref 0–16)

## 2014-05-28 LAB — STREP PNEUMONIAE URINARY ANTIGEN: Strep Pneumo Urinary Antigen: NEGATIVE

## 2014-05-28 LAB — APTT: APTT: 36 s (ref 24–37)

## 2014-05-28 LAB — LIPASE, BLOOD: Lipase: 22 U/L (ref 22–51)

## 2014-05-28 MED ORDER — CEFEPIME HCL 2 G IJ SOLR
2.0000 g | Freq: Once | INTRAMUSCULAR | Status: AC
  Administered 2014-05-28: 2 g via INTRAVENOUS
  Filled 2014-05-28: qty 2

## 2014-05-28 MED ORDER — SODIUM CHLORIDE 0.9 % IV BOLUS (SEPSIS)
1000.0000 mL | Freq: Once | INTRAVENOUS | Status: AC
  Administered 2014-05-28: 1000 mL via INTRAVENOUS

## 2014-05-28 MED ORDER — HYDROCORTISONE NA SUCCINATE PF 100 MG IJ SOLR
50.0000 mg | Freq: Three times a day (TID) | INTRAMUSCULAR | Status: DC
  Administered 2014-05-28 – 2014-06-01 (×12): 50 mg via INTRAVENOUS
  Filled 2014-05-28 (×8): qty 1
  Filled 2014-05-28: qty 2
  Filled 2014-05-28 (×6): qty 1

## 2014-05-28 MED ORDER — VANCOMYCIN HCL IN DEXTROSE 1-5 GM/200ML-% IV SOLN
1000.0000 mg | INTRAVENOUS | Status: DC
  Administered 2014-05-29 – 2014-05-30 (×2): 1000 mg via INTRAVENOUS
  Filled 2014-05-28 (×3): qty 200

## 2014-05-28 MED ORDER — ACETAMINOPHEN 325 MG PO TABS: 650.0000 mg | ORAL_TABLET | Freq: Four times a day (QID) | ORAL | Status: DC | PRN

## 2014-05-28 MED ORDER — LEVOTHYROXINE SODIUM 125 MCG PO TABS
125.0000 ug | ORAL_TABLET | Freq: Every day | ORAL | Status: DC
  Administered 2014-05-28: 125 ug via ORAL
  Filled 2014-05-28 (×3): qty 1

## 2014-05-28 MED ORDER — RANOLAZINE ER 500 MG PO TB12
500.0000 mg | ORAL_TABLET | Freq: Two times a day (BID) | ORAL | Status: DC
Start: 2014-05-28 — End: 2014-05-29
  Administered 2014-05-28: 500 mg via ORAL
  Filled 2014-05-28 (×3): qty 1

## 2014-05-28 MED ORDER — BISACODYL 10 MG RE SUPP: 10.0000 mg | Freq: Every day | RECTAL | Status: DC | PRN

## 2014-05-28 MED ORDER — SODIUM CHLORIDE 0.9 % IV SOLN
INTRAVENOUS | Status: DC
  Administered 2014-05-28 – 2014-06-04 (×7): via INTRAVENOUS
  Administered 2014-06-07: 10 mL/h via INTRAVENOUS

## 2014-05-28 MED ORDER — VANCOMYCIN HCL 10 G IV SOLR
1500.0000 mg | Freq: Once | INTRAVENOUS | Status: AC
  Administered 2014-05-28: 1500 mg via INTRAVENOUS
  Filled 2014-05-28: qty 1500

## 2014-05-28 MED ORDER — PANTOPRAZOLE SODIUM 40 MG PO TBEC
40.0000 mg | DELAYED_RELEASE_TABLET | Freq: Every day | ORAL | Status: DC
  Administered 2014-05-28: 40 mg via ORAL
  Filled 2014-05-28: qty 1

## 2014-05-28 MED ORDER — METHOCARBAMOL 500 MG PO TABS
500.0000 mg | ORAL_TABLET | Freq: Every day | ORAL | Status: DC
  Administered 2014-05-28: 500 mg via ORAL
  Filled 2014-05-28 (×2): qty 1

## 2014-05-28 MED ORDER — DEXTROSE 5 % IV SOLN
1.0000 g | INTRAVENOUS | Status: DC
  Administered 2014-05-29 – 2014-06-04 (×7): 1 g via INTRAVENOUS
  Filled 2014-05-28 (×7): qty 1

## 2014-05-28 MED ORDER — IOHEXOL 300 MG/ML  SOLN
25.0000 mL | INTRAMUSCULAR | Status: AC
  Administered 2014-05-28: 25 mL via ORAL

## 2014-05-28 MED ORDER — VANCOMYCIN HCL IN DEXTROSE 1-5 GM/200ML-% IV SOLN: 1000.0000 mg | Freq: Once | INTRAVENOUS | Status: DC

## 2014-05-28 MED ORDER — HEPARIN SODIUM (PORCINE) 5000 UNIT/ML IJ SOLN
5000.0000 [IU] | Freq: Three times a day (TID) | INTRAMUSCULAR | Status: DC
  Administered 2014-05-28 – 2014-05-29 (×2): 5000 [IU] via SUBCUTANEOUS
  Filled 2014-05-28 (×5): qty 1

## 2014-05-28 NOTE — Consult Note (Signed)
PULMONARY / CRITICAL CARE MEDICINE   Name: Scott Mooney MRN: [id number] DOB: [birth year]    ADMISSION DATE:  05/15/2014 CONSULTATION DATE:  5/187/2015  REFERRING MD :  EDP  CHIEF COMPLAINT:  SOB  INITIAL PRESENTATION: 81 year old male with recent admission for back pain during which he received kyphoplasty. He presented again to [facility] ED 5/17 complaining of SOB and back pain. He was hypotensive with elevated lactic which did not improve with IVF. CXR showed questionable LLL infiltrate. PCCM consulted for further evaluation  STUDIES:    SIGNIFICANT EVENTS: 5/8 > 5/12 admission for back pain, kyphoplasty via IR 5/10.  5/17 > admitted for shock of unclear etiology   HISTORY OF PRESENT ILLNESS:  81 year old male with PMH as below, which includes CHF, pituitary tumor with insufficiency now (on chronic steroids), splenic flexure syndrome, CAD, cardiomyopathy. He initially presented to [facility] ED with back pain 5/8 and was admitted for pain management. Subsequent imaging showed L4 compression fracture, chronic T12 compression fracture. Underwent IR kyphoplasty 5/10. Was discharged with home health 5/12. 5/17 He again presented to the emergency department complaining of back pain and this time with SOB. He was also hypotensive at the time of presentation with elevated lactic acid to 2.9. This cleared to 2.7 after 1L of volume. Also hypoxemic with O2 sat 92% on 4L. PCCM was contacted for further eval. Currently patient complains of back pain, and voiced no other complaints.    PAST MEDICAL HISTORY :   has a past medical history of Diastolic dysfunction; History of pituitary tumor; Pituitary insufficiency; Hypothyroidism; GERD (gastroesophageal reflux disease); Splenic flexure syndrome; CHF (congestive heart failure); Hiatal hernia; Coronary artery disease; Cardiomyopathy; Diverticulitis; Nephrolithiasis; Fatty liver; Gout; Arthritis; Exogenous obesity; Personal history of colonic polyps (07/23/2008);  Ureteral stone; Thrombocytopenia; and Shortness of breath.  has past surgical history that includes Transphenoidal pituitary resection (2001); Cholecystectomy, laparoscopic (2004); and Esophageal dilation (12/2006). Prior to Admission medications   Medication Sig Start Date End Date Taking? Authorizing Provider  acetaminophen (TYLENOL) 325 MG tablet Take 2 tablets (650 mg total) by mouth every 6 (six) hours as needed for mild pain. 05/23/14  Yes Geradine Girt, DO  bisacodyl (DULCOLAX) 10 MG suppository Place 1 suppository (10 mg total) rectally daily as needed for moderate constipation. 05/23/14  Yes Jessica U Vann, DO  Emollient ([employer] ADVANCED CARE EX) Apply 1 application topically daily.   Yes Historical Provider, MD  esomeprazole (NEXIUM) 40 MG capsule Take 40 mg by mouth daily at 12 noon.   Yes Historical Provider, MD  feeding supplement, ENSURE COMPLETE, (ENSURE COMPLETE) LIQD Take 237 mLs by mouth 2 (two) times daily between meals. 07/10/13  Yes Leanna Battles, MD  furosemide (LASIX) 40 MG tablet Take 2 tablets (80 mg total) by mouth daily. 05/01/14  Yes Darlin Coco, MD  hydrocortisone (CORTEF) 20 MG tablet Take 1 tablet (20 mg total) by mouth daily. 05/23/14  Yes Geradine Girt, DO  levothyroxine (SYNTHROID, LEVOTHROID) 125 MCG tablet Take 125 mcg by mouth every morning.   Yes Historical Provider, MD  meloxicam (MOBIC) 7.5 MG tablet Take 7.5 mg by mouth 2 (two) times daily. For back pain   Yes Historical Provider, MD  methocarbamol (ROBAXIN) 500 MG tablet Take 500 mg by mouth at bedtime.   Yes Historical Provider, MD  ondansetron (ZOFRAN) 4 MG tablet Take 1 tablet (4 mg total) by mouth daily. 07/10/13  Yes Leanna Battles, MD  polyethylene glycol [facility] / GLYCOLAX) packet Take 17  g by mouth daily as needed for mild constipation. 05/23/14  Yes Geradine Girt, DO  potassium chloride SA (K-DUR,KLOR-CON) 20 MEQ tablet Take 2 tablets (40 mEq total) by mouth daily. Patient taking differently:  Take 20 mEq by mouth 2 (two) times daily.  07/16/12  Yes Barton Dubois, MD  ranolazine (RANEXA) 500 MG 12 hr tablet Take 1 tablet (500 mg total) by mouth 2 (two) times daily. 09/19/12  Yes Darlin Coco, MD  senna-docusate (SENOKOT-S) 8.6-50 MG per tablet Take 1 tablet by mouth 2 (two) times daily. 05/23/14  Yes Geradine Girt, DO   Allergies  Allergen Reactions  . Penicillins Swelling and Other (See Comments)    Head and feet swelling, spent 5 days in hospital as a result of taking med    FAMILY HISTORY:  indicated that his mother is deceased. He indicated that his father is deceased. He indicated that the status of his brother is unknown.  SOCIAL HISTORY:  reports that he quit smoking about 52 years ago. He has never used smokeless tobacco. He reports that he does not drink alcohol or use illicit drugs.  REVIEW OF SYSTEMS:   Bolds are positive  Constitutional: weight loss, gain, night sweats, Fevers, chills, fatigue .  HEENT: headaches, Sore throat, sneezing, nasal congestion, post nasal drip, Difficulty swallowing, Tooth/dental problems, visual complaints visual changes, ear ache CV:  chest pain, radiates: ,Orthopnea, PND, swelling in lower extremities, dizziness, palpitations, syncope.  GI  heartburn, indigestion, abdominal pain, nausea, vomiting, diarrhea, change in bowel habits, loss of appetite, bloody stools.  Resp: cough, productive: , hemoptysis, dyspnea, chest pain, pleuritic.  Skin: rash or itching or icterus GU: dysuria, change in color of urine, urgency or frequency. flank pain, hematuria  HU:TMLY pain or swelling. decreased range of motion  Psych: change in mood or affect. depression or anxiety.  Neuro: difficulty with speech, weakness, numbness, ataxia    SUBJECTIVE:   VITAL SIGNS: Temp:  [98.5 F (36.9 C)] 98.5 F (36.9 C) (05/17 0950) Pulse Rate:  [85-91] 91 (05/17 1445) Resp:  [30-43] 42 (05/17 1315) BP: (87-117)/(26-64) 97/36 mmHg (05/17 1445) SpO2:  [94 %-99  %] 95 % (05/17 1445) Weight:  [92.987 kg (205 lb)] 92.987 kg (205 lb) (05/17 0949) HEMODYNAMICS:   VENTILATOR SETTINGS:   INTAKE / OUTPUT: No intake or output data in the 24 hours ending 05/25/2014 1528  PHYSICAL EXAMINATION: General:  Elderly male in NAD Neuro:  Alert, oriented, non-focal Head: Lane/AT EENT: PERRL, no JVD noted Cardiovascular:  RRR, no MRG Lungs:  Diminished R base, otherwise scant rhonchi Abdomen:  Soft, non-tender, non-distended Musculoskeletal:  No acute deformity or ROM limitation.  Skin:  Bilateral lower extremity 2+ edema.  LABS:  CBC  Recent Labs Lab 06/01/2014 1040  WBC 4.3  HGB 17.5*  HCT 50.4  PLT 87*   Coag's  Recent Labs Lab 05/27/2014 1040  INR 1.26   BMET  Recent Labs Lab 06/02/2014 1040  NA 138  K 3.9  CL 103  CO2 24  BUN 20  CREATININE 2.20*  GLUCOSE 94   Electrolytes  Recent Labs Lab 05/14/2014 1040  CALCIUM 8.7*   Sepsis Markers  Recent Labs Lab 05/12/2014 1052 05/27/2014 1314 05/19/2014 1406  LATICACIDVEN 2.93* 2.74*  --   PROCALCITON  --   --  4.63   ABG  Recent Labs Lab 06/08/2014 1241  PHART 7.386  PCO2ART 29.1*  PO2ART 78.0*   Liver Enzymes  Recent Labs Lab 05/29/2014 1040  AST 44*  ALT 17  ALKPHOS 137*  BILITOT 1.3*  ALBUMIN 3.1*   Cardiac Enzymes No results for input(s): TROPONINI, PROBNP in the last 168 hours. Glucose No results for input(s): GLUCAP in the last 168 hours.  Imaging No results found.   ASSESSMENT / PLAN:  PULMONARY A: Acute hypoxemic respiratory failure Concern for respiratory failure. P:   Supplemental O2 as needed to maintain SpO2 > 92% Monitor respiratory status closely, if evidence of tiring out then will need intubation and transfer to the ICU  CARDIOVASCULAR CVL L IJ TLC 5/17>>> A: Circulatory shock due to withdrawal from steroids. P:  - Stress dose steroids. - No pressors for now. - Fluid resuscitation.  RENAL A:  Acute renal failure, suspect  dehydration. P:   - IVF resuscitation. - Follow CVP if able on SDU. - BMET in AM. - Replace electrolytes as indicated.  GASTROINTESTINAL A:  Abdominal pain, unknown cause. P:   - CT of the abdomen/pelvis now. - Monitor WBC.  HEMATOLOGIC A:  WBC normal.  Hg elevated.  Likely dehydrated. P:  - IVF resuscitation. - CBC in AM. - Transfusion per protocol.  INFECTIOUS A:  Source of infection unknown, WBC normal but patient is on steroids. P:   BCx2 5/17>>> UC 5/17>>> Sputum 5/17>>> Abx: Cefepime 5/17>>>  Vanc 5/17>>> F/U on cultures.  ENDOCRINE A:  Adrenal insufficiency due to a pituitary tumor.   P:   Stress dose steroids.  NEUROLOGIC A:  Sleepy but easily aousable. P:   Hold all sedating medications.   FAMILY  - Updates: No family bedside.  The patient is critically ill with multiple organ systems failure and requires high complexity decision making for assessment and support, frequent evaluation and titration of therapies, application of advanced monitoring technologies and extensive interpretation of multiple databases.   Critical Care Time devoted to patient care services described in this note is  35  Minutes. This time reflects time of care of this signee Dr [name]. This critical care time does not reflect procedure time, or teaching time or supervisory time of PA/NP/Med student/Med Resident etc but could involve care discussion time.  Rush Farmer, M.D. [facility] Pulmonary/Critical Care Medicine. Pager: [phone]. After hours pager: [phone].   06/10/2014, 3:28 PM

## 2014-05-28 NOTE — ED Notes (Signed)
Respiratory at bedside.

## 2014-05-28 NOTE — ED Notes (Signed)
Angie and Talmage Nap POA, will provide copy. [phone] Call if any changes.

## 2014-05-28 NOTE — Procedures (Signed)
Central Venous Catheter Insertion Procedure Note Scott Mooney [id number] January 31, 1929  Procedure: Insertion of Central Venous Catheter Indications: Assessment of intravascular volume  Procedure Details Consent: Risks of procedure as well as the alternatives and risks of each were explained to the (patient/caregiver).  Consent for procedure obtained. Time Out: Verified patient identification, verified procedure, site/side was marked, verified correct patient position, special equipment/implants available, medications/allergies/relevent history reviewed, required imaging and test results available.  Performed  Maximum sterile technique was used including antiseptics, cap, gloves, gown, hand hygiene, mask and sheet. Skin prep: Chlorhexidine; local anesthetic administered A antimicrobial bonded/coated triple lumen catheter was placed in the left internal jugular vein using the Seldinger technique. Ultrasound guidance used.Yes.   Catheter placed to 20 cm. Blood aspirated via all 3 ports and then flushed x 3. Line sutured x 2 and dressing applied.  Evaluation Blood flow good Complications: No apparent complications Patient did tolerate procedure well. Chest X-ray ordered to verify placement.  CXR: pending.  [name], AGACNP-BC [facility] Pulmonology/Critical Care Pager [phone] or [phone]  05/29/2014 5:51 PM  Done by NP.  Rush Farmer, M.D. [facility] Pulmonary/Critical Care Medicine. Pager: [phone]. After hours pager: [phone].

## 2014-05-28 NOTE — ED Notes (Signed)
Admitting at bedside 

## 2014-05-28 NOTE — ED Notes (Signed)
Lab results of I-Stat CG4 of 2.93 reported to Dr.Walden.

## 2014-05-28 NOTE — ED Notes (Signed)
Pt arrives from home via [facility]. Pt had back surgery last week and states he hasn't had any relief and his pain in his back has worsened. Pt States last night he began having SOB that has progressively worsened throughout the night. Pt is afebrile at this time with tachypnea and a soft bp.

## 2014-05-28 NOTE — ED Provider Notes (Signed)
CSN: [id number]     Arrival date & time 05/16/2014  8502 History   First MD Initiated Contact with Patient 06/08/2014 [phone]     Chief Complaint  Patient presents with  . Shortness of Breath  . Back Pain     (Consider location/radiation/quality/duration/timing/severity/associated sxs/prior Treatment) Patient is a 81 y.o. male presenting with shortness of breath and back pain. The history is provided by the patient.  Shortness of Breath Severity:  Moderate Onset quality:  Gradual Timing:  Constant Progression:  Unchanged Chronicity:  New Context: not URI   Relieved by:  Nothing Worsened by:  Nothing tried Ineffective treatments:  None tried Associated symptoms: fever   Associated symptoms: no abdominal pain, no chest pain and no vomiting   Risk factors: recent surgery (recent hospitalization and kyphoplasty for L4 compression fracture)   Back Pain Associated symptoms: fever   Associated symptoms: no abdominal pain and no chest pain     Past Medical History  Diagnosis Date  . Diastolic dysfunction   . History of pituitary tumor   . Pituitary insufficiency   . Hypothyroidism   . GERD (gastroesophageal reflux disease)   . Splenic flexure syndrome   . CHF (congestive heart failure)     with diastolic dysfunction  . Hiatal hernia   . Coronary artery disease   . Cardiomyopathy   . Diverticulitis     recurrent  . Nephrolithiasis     right-sided  . Fatty liver   . Gout   . Arthritis   . Exogenous obesity   . Personal history of colonic polyps 07/23/2008    TUBULAR ADENOMA  . Ureteral stone     distal ureteral stone   . Thrombocytopenia   . Shortness of breath    Past Surgical History  Procedure Laterality Date  . Transphenoidal pituitary resection  2001  . Cholecystectomy, laparoscopic  2004  . Esophageal dilation  12/2006    endoscopy with dilatation of esophageal stricture   Family History  Problem Relation Age of Onset  . Colon cancer Brother   . Cancer Sister       ?  . Stroke Father   . Skin cancer Mother    History  Substance Use Topics  . Smoking status: Former Smoker    Quit date: 01/11/1962  . Smokeless tobacco: Never Used  . Alcohol Use: No    Review of Systems  Unable to perform ROS: Acuity of condition  Constitutional: Positive for fever.  Respiratory: Positive for shortness of breath.   Cardiovascular: Negative for chest pain.  Gastrointestinal: Negative for vomiting and abdominal pain.  Musculoskeletal: Positive for back pain.      Allergies  Penicillins  Home Medications   Prior to Admission medications   Medication Sig Start Date End Date Taking? Authorizing Provider  acetaminophen (TYLENOL) 325 MG tablet Take 2 tablets (650 mg total) by mouth every 6 (six) hours as needed for mild pain. 05/23/14   Geradine Girt, DO  bisacodyl (DULCOLAX) 10 MG suppository Place 1 suppository (10 mg total) rectally daily as needed for moderate constipation. 05/23/14   Geradine Girt, DO  Emollient [facility] ADVANCED CARE EX) Apply 1 application topically daily.    Historical Provider, MD  esomeprazole (NEXIUM) 40 MG capsule Take 40 mg by mouth daily at 12 noon.    Historical Provider, MD  feeding supplement, ENSURE COMPLETE, (ENSURE COMPLETE) LIQD Take 237 mLs by mouth 2 (two) times daily between meals. 07/10/13   Leanna Battles, MD  furosemide (LASIX) 40 MG tablet Take 2 tablets (80 mg total) by mouth daily. 05/01/14   Darlin Coco, MD  hydrocortisone (CORTEF) 20 MG tablet Take 1 tablet (20 mg total) by mouth daily. 05/23/14   Geradine Girt, DO  levothyroxine (SYNTHROID, LEVOTHROID) 125 MCG tablet Take 125 mcg by mouth every morning.    Historical Provider, MD  meloxicam (MOBIC) 7.5 MG tablet Take 7.5 mg by mouth 2 (two) times daily. For back pain    Historical Provider, MD  methocarbamol (ROBAXIN) 500 MG tablet Take 500 mg by mouth at bedtime.    Historical Provider, MD  ondansetron (ZOFRAN) 4 MG tablet Take 1 tablet (4 mg total) by  mouth daily. 07/10/13   Leanna Battles, MD  polyethylene glycol [facility] / Floria Raveling) packet Take 17 g by mouth daily as needed for mild constipation. 05/23/14   Geradine Girt, DO  potassium chloride SA (K-DUR,KLOR-CON) 20 MEQ tablet Take 2 tablets (40 mEq total) by mouth daily. Patient taking differently: Take 20 mEq by mouth 2 (two) times daily.  07/16/12   Barton Dubois, MD  ranolazine (RANEXA) 500 MG 12 hr tablet Take 1 tablet (500 mg total) by mouth 2 (two) times daily. 09/19/12   Darlin Coco, MD  senna-docusate (SENOKOT-S) 8.6-50 MG per tablet Take 1 tablet by mouth 2 (two) times daily. 05/23/14   Jessica U Vann, DO   BP 100/33 mmHg  Pulse 89  Temp(Src) 98.5 F (36.9 C) (Oral)  Resp 34  Ht 5\' 4"  (1.626 m)  Wt 205 lb (92.987 kg)  BMI 35.17 kg/m2  SpO2 97% Physical Exam  Constitutional: He is oriented to person, place, and time. He appears well-developed and well-nourished. No distress.  HENT:  Head: Normocephalic and atraumatic.  Mouth/Throat: No oropharyngeal exudate.  Eyes: EOM are normal. Pupils are equal, round, and reactive to light.  Neck: Normal range of motion. Neck supple.  Cardiovascular: Normal rate and regular rhythm.  Exam reveals no friction rub.   No murmur heard. Pulmonary/Chest: Effort normal and breath sounds normal. No respiratory distress. He has no wheezes. He has no rales.  Abdominal: Soft. He exhibits no distension. There is no tenderness. There is no rebound.  Musculoskeletal: Normal range of motion. He exhibits no edema.       Lumbar back: He exhibits normal range of motion, no tenderness and no swelling.       Back:  Neurological: He is alert and oriented to person, place, and time.  Skin: No rash noted. He is not diaphoretic.  Nursing note and vitals reviewed.   ED Course  Procedures (including critical care time) Labs Review Labs Reviewed  CULTURE, BLOOD (ROUTINE X 2)  CULTURE, BLOOD (ROUTINE X 2)  URINE CULTURE  CBC WITH  DIFFERENTIAL/PLATELET  COMPREHENSIVE METABOLIC PANEL  URINALYSIS, ROUTINE W REFLEX MICROSCOPIC  PROTIME-INR  CORTISOL  SEDIMENTATION RATE  I-STAT CG4 LACTIC ACID, ED    Imaging Review Dg Chest Port 1 View  05/31/2014   CLINICAL DATA:  81 year old male with a history of back surgery.  EXAM: PORTABLE CHEST - 1 VIEW  COMPARISON:  05/19/2014  FINDINGS: Cardiomediastinal silhouette is unchanged. Atherosclerotic calcifications of the aortic arch.  Low lung volumes, with persistent elevation of the right hemidiaphragm. Retrocardiac region not well evaluated.  Coarsened interstitial markings. No evidence of confluent airspace disease or pneumothorax.  No displaced fracture.  IMPRESSION: Low lung volumes with chronic changes and without evidence of lobar pneumonia. Left base not well evaluated and if there is  concern for acute process, a formal PA and lateral chest x-ray may be useful.  Atherosclerosis.  Signed,  Dulcy Fanny. Earleen Newport, DO  [facility]  [facility]   Electronically Signed   By: Corrie Mckusick D.O.   On: 05/27/2014 10:40   Dg Abd Portable 2v  05/17/2014   CLINICAL DATA:  Vomiting and mid abdominal pain. Recent L4 kyphoplasty on 05/21/2014.  EXAM: PORTABLE ABDOMEN - 2 VIEW  COMPARISON:  CT of the abdomen and pelvis without contrast on 05/19/2014  FINDINGS: No evidence of bowel obstruction or significant ileus. No free air identified. Methylmethacrylate present in the L4 vertebral body. No abnormal calcifications.  IMPRESSION: No acute findings.   Electronically Signed   By: Aletta Edouard M.D.   On: 05/31/2014 14:55     EKG Interpretation   Date/Time:  Tuesday May 28 2014 09:47:47 EDT Ventricular Rate:  92 PR Interval:  194 QRS Duration: 149 QT Interval:  417 QTC Calculation: 516 R Axis:   -71 Text Interpretation:  Sinus rhythm Right bundle branch block Inferior  infarct, old Baseline wander in lead(s) V2 No significant change since  last  tracing Confirmed by [facility]  MD, Jelisha Weed ([id number]) on 06/06/2014 9:56:26 AM      MDM   Final diagnoses:  Tachypnea  Acute renal failure, unspecified acute renal failure type  Hypotension, unspecified hypotension type    81 year old male here with weakness, shortness of breath. Recently admitted for back pain and had kyphoplasty by interventional radiology. He is mildly confused he cannot tell me exactly nature of what's going on. He at least tachypnea, blood pressures are soft with soft diastolic pressures. He's rectally febrile to 101.6. Belly is benign. Lungs are clear. He's not hypoxic but was placed on 2 L and see if it would help his tachypnea. Spine is kyphoplasty is well appearing without signs of erythema. I am concerned for possible infection will check chest x-ray look for possible pneumonia. If no source is found we'll have to do MRI to look at his kyphoplasty site. Labs show ARF. Lactate mildly elevated, mild improvement after fluids. Admitted to medicine.   Evelina Bucy, MD 05/27/2014 [phone]

## 2014-05-28 NOTE — Progress Notes (Signed)
CRITICAL VALUE ALERT  Critical value received:  Lactic acid 3.2  Date of notification:  05/17/2014  Time of notification:  1937  Critical value read back: yes  Nurse who received alert:  Remo Lipps, RN  MD notified (1st page):K. Schorr  Time of first page:  1940  MD notified (2nd page):  Time of second page:  Responding MD:  Lamar Blinks  Time MD responded:  2000

## 2014-05-28 NOTE — ED Notes (Signed)
Marya Amsler Zone 3 RN looking for a certified nurse to do coudee.

## 2014-05-28 NOTE — H&P (Signed)
[facility] History and Physical                                                                                    Scott Mooney, is a 81 y.o. male  MRN: [id number]   DOB - 1929/06/25  Admit Date - 06/05/2014  Outpatient Primary MD for the patient is Donnajean Lopes, MD  Referring MD: Mingo Amber / ER  Consulting MD: Nelda Marseille / PCCM  With History of -  Past Medical History  Diagnosis Date  . Diastolic dysfunction   . History of pituitary tumor   . Pituitary insufficiency   . Hypothyroidism   . GERD (gastroesophageal reflux disease)   . Splenic flexure syndrome   . CHF (congestive heart failure)     with diastolic dysfunction  . Hiatal hernia   . Coronary artery disease   . Cardiomyopathy   . Diverticulitis     recurrent  . Nephrolithiasis     right-sided  . Fatty liver   . Gout   . Arthritis   . Exogenous obesity   . Personal history of colonic polyps 07/23/2008    TUBULAR ADENOMA  . Ureteral stone     distal ureteral stone   . Thrombocytopenia   . Shortness of breath       Past Surgical History  Procedure Laterality Date  . Transphenoidal pituitary resection  2001  . Cholecystectomy, laparoscopic  2004  . Esophageal dilation  12/2006    endoscopy with dilatation of esophageal stricture    in for   Chief Complaint  Patient presents with  . Shortness of Breath  . Back Pain     HPI This is a 81 year old male patient with history of grade 2 diastolic dysfunction chronic adrenal insufficiency after pituitary surgery, hypothyroidism and CAD. He was recently hospitalized for acute L4 compression fracture and subsequently underwent kyphoplasty and was discharged on 5/12. In talking with the patient and his family about how things have been going since discharge patient initially had been doing well. 24 hours after discharge he opted to increase his physical activity and was walking rather frequently with his walker and utilizing physical therapy bands on a  regular basis. Patient felt though that he may have done his physical activity given the fact that for the past several days he had been unable to sleep well because of back discomfort. He took muscle relaxers but had been unable to sleep for at least 2-3 days. He also had not been eating well and had noticed increasing nausea and vomiting. He had subjective fevers at home. He has not had any diarrhea. He also has had some coughing with yellow sputum he denied dysuria. He denied dyspnea on exertion. Because of increasing confusion and increasing respiratory rate he was brought to the ER.  The patient initially arrived to the ER he was afebrile but subsequent repeat temperature was 101.6. His respiratory rate was 34. His heart rate was 90, BP was 100/33 with an MHP of 49. He was maintaining O2 saturations of 97% on 2 L. Patient has been given a total of 2 L of IV fluid. His initial lactic acid was elevated at  2.93 and after fluid challenges was down only to 2.74. He has acute renal failure with a BUN of 20 creatinine of 2.2 with baseline renal function being BUN 16 creatinine 1.24. Procalcitonin was 4.63. White count was normal with normal neutrophils but platelets 87,000 noting platelets were 153,000 during previous admission although Asian appears to have a history of transient thrombocytopenia as well. Chest x-ray in the ER demonstrated low lung volumes without evidence of lobar pneumonia but the left base was not well evaluated. Blood cultures have been obtained in the ER, urinalysis and culture is pending noting patient has not voided since arrival. Patient has been started on empiric antibiotics with Maxipime and vancomycin. Of note examination by EDP of kyphoplasty site demonstrated unremarkable findings.   Review of Systems   In addition to the HPI above,  No Headache, changes with Vision or hearing, new weakness, tingling, numbness in any extremity, No problems swallowing food or Liquids,  indigestion/reflux-anorexia with poor intake for 2 days No Chest pain or Shortness of Breath, palpitations, orthopnea or DOE No melena or hematochezia, no dark tarry stools, Bowel movements are regular, No dysuria, hematuria or flank pain No new skin rashes, lesions, masses or bruises, No new joints pains-aches No recent weight gain or loss No polyuria, polydypsia or polyphagia,  *A full 10 point Review of Systems was done, except as stated above, all other Review of Systems were negative.  Social History History  Substance Use Topics  . Smoking status: Former Smoker    Quit date: 01/11/1962  . Smokeless tobacco: Never Used  . Alcohol Use: No    Resides at: Private residence  Lives with: Wife  Ambulatory status: Utilized rolling walker prior to admission   Family History Family History  Problem Relation Age of Onset  . Colon cancer Brother   . Cancer Sister     ?  . Stroke Father   . Skin cancer Mother      Prior to Admission medications   Medication Sig Start Date End Date Taking? Authorizing Provider  acetaminophen (TYLENOL) 325 MG tablet Take 2 tablets (650 mg total) by mouth every 6 (six) hours as needed for mild pain. 05/23/14  Yes Geradine Girt, DO  bisacodyl (DULCOLAX) 10 MG suppository Place 1 suppository (10 mg total) rectally daily as needed for moderate constipation. 05/23/14  Yes Jessica U Vann, DO  Emollient (AVEENO ADVANCED CARE EX) Apply 1 application topically daily.   Yes Historical Provider, MD  esomeprazole (NEXIUM) 40 MG capsule Take 40 mg by mouth daily at 12 noon.   Yes Historical Provider, MD  feeding supplement, ENSURE COMPLETE, (ENSURE COMPLETE) LIQD Take 237 mLs by mouth 2 (two) times daily between meals. 07/10/13  Yes Leanna Battles, MD  furosemide (LASIX) 40 MG tablet Take 2 tablets (80 mg total) by mouth daily. 05/01/14  Yes Darlin Coco, MD  hydrocortisone (CORTEF) 20 MG tablet Take 1 tablet (20 mg total) by mouth daily. 05/23/14  Yes  Geradine Girt, DO  levothyroxine (SYNTHROID, LEVOTHROID) 125 MCG tablet Take 125 mcg by mouth every morning.   Yes Historical Provider, MD  meloxicam (MOBIC) 7.5 MG tablet Take 7.5 mg by mouth 2 (two) times daily. For back pain   Yes Historical Provider, MD  methocarbamol (ROBAXIN) 500 MG tablet Take 500 mg by mouth at bedtime.   Yes Historical Provider, MD  ondansetron (ZOFRAN) 4 MG tablet Take 1 tablet (4 mg total) by mouth daily. 07/10/13  Yes Leanna Battles, MD  polyethylene  glycol (MIRALAX / GLYCOLAX) packet Take 17 g by mouth daily as needed for mild constipation. 05/23/14  Yes Geradine Girt, DO  potassium chloride SA (K-DUR,KLOR-CON) 20 MEQ tablet Take 2 tablets (40 mEq total) by mouth daily. Patient taking differently: Take 20 mEq by mouth 2 (two) times daily.  07/16/12  Yes Barton Dubois, MD  ranolazine (RANEXA) 500 MG 12 hr tablet Take 1 tablet (500 mg total) by mouth 2 (two) times daily. 09/19/12  Yes Darlin Coco, MD  senna-docusate (SENOKOT-S) 8.6-50 MG per tablet Take 1 tablet by mouth 2 (two) times daily. 05/23/14  Yes Geradine Girt, DO    Allergies  Allergen Reactions  . Penicillins Swelling and Other (See Comments)    Head and feet swelling, spent 5 days in hospital as a result of taking med    Physical Exam  Vitals  Blood pressure 97/36, pulse 91, temperature 98.5 F (36.9 C), temperature source Oral, resp. rate 42, height 5\' 4"  (1.626 m), weight 205 lb (92.987 kg), SpO2 95 %.   General:  In moderate acute distress as evidenced by recurrent hypotension and tachypnea, appears ox 6  Psych:  Normal affect, Denies Suicidal or Homicidal ideations, Awake Alert, Oriented X 3. Speech and thought patterns are clear and appropriate  Neuro:   No focal neurological deficits, CN II through XII intact, Strength 5/5 all 4 extremities, Sensation intact all 4 extremities.  ENT:  Ears and Eyes appear Normal, Conjunctivae clear, PER. Moist oral mucosa without erythema or  exudates.  Neck:  Supple, No lymphadenopathy appreciated  Respiratory:  Symmetrical chest wall movement, Good air movement bilaterally, basilar crackles-persistent and marked tachypnea-2 L  Cardiac:  RRR, No Murmurs, no LE edema noted, no JVD, No carotid bruits, peripheral pulses palpable at 2+-systolic blood pressure has been labile and MEP has been consistently less than 65 since presentation with most recent MAP 49  Abdomen:  Absent to hypoactive/isolated bowel sounds, Soft, questionably tender right side noting previous cholecystectomy scar (confirmed by patient), Non distended,  No masses appreciated, no obvious hepatosplenomegaly  Skin:  No Cyanosis, poor Skin Turgor, No Skin Rash or Bruise. Flushed  Extremities: Symmetrical without obvious trauma or injury,  no effusions.  Data Review  CBC  Recent Labs Lab 06/10/2014 1040  WBC 4.3  HGB 17.5*  HCT 50.4  PLT 87*  MCV 91.1  MCH 31.6  MCHC 34.7  RDW 15.4  LYMPHSABS 0.9  MONOABS 0.6  EOSABS 0.1  BASOSABS 0.0    Chemistries   Recent Labs Lab 06/09/2014 1040  NA 138  K 3.9  CL 103  CO2 24  GLUCOSE 94  BUN 20  CREATININE 2.20*  CALCIUM 8.7*  AST 44*  ALT 17  ALKPHOS 137*  BILITOT 1.3*    estimated creatinine clearance is 26.2 mL/min (by C-G formula based on Cr of 2.2).  No results for input(s): TSH, T4TOTAL, T3FREE, THYROIDAB in the last 72 hours.  Invalid input(s): FREET3  Coagulation profile  Recent Labs Lab 05/22/2014 1040  INR 1.26    No results for input(s): DDIMER in the last 72 hours.  Cardiac Enzymes No results for input(s): CKMB, TROPONINI, MYOGLOBIN in the last 168 hours.  Invalid input(s): CK  Invalid input(s): POCBNP  Urinalysis    Component Value Date/Time   COLORURINE YELLOW 05/19/2014 1339   APPEARANCEUR CLEAR 05/19/2014 1339   LABSPEC 1.007 05/19/2014 1339   PHURINE 7.0 05/19/2014 1339   GLUCOSEU NEGATIVE 05/19/2014 1339   HGBUR NEGATIVE 05/19/2014 1339  BILIRUBINUR  NEGATIVE 05/19/2014 1339   KETONESUR NEGATIVE 05/19/2014 1339   PROTEINUR NEGATIVE 05/19/2014 1339   UROBILINOGEN 0.2 05/19/2014 1339   NITRITE NEGATIVE 05/19/2014 1339   LEUKOCYTESUR NEGATIVE 05/19/2014 1339    Imaging results:   Dg Chest Port 1 View  05/17/2014   CLINICAL DATA:  81 year old male with a history of back surgery.  EXAM: PORTABLE CHEST - 1 VIEW  COMPARISON:  05/19/2014  FINDINGS: Cardiomediastinal silhouette is unchanged. Atherosclerotic calcifications of the aortic arch.  Low lung volumes, with persistent elevation of the right hemidiaphragm. Retrocardiac region not well evaluated.  Coarsened interstitial markings. No evidence of confluent airspace disease or pneumothorax.  No displaced fracture.  IMPRESSION: Low lung volumes with chronic changes and without evidence of lobar pneumonia. Left base not well evaluated and if there is concern for acute process, a formal PA and lateral chest x-ray may be useful.  Atherosclerosis.  Signed,  Dulcy Fanny. Earleen Newport, DO  [facility]  [facility]   Electronically Signed   By: Corrie Mckusick D.O.   On: 06/08/2014 10:40   Dg Chest Port 1 View  05/19/2014   CLINICAL DATA:  81 year old male with a history of cough. No chest pain  EXAM: PORTABLE CHEST - 1 VIEW  COMPARISON:  07/09/2013, 07/14/2012  FINDINGS: Cardiomediastinal silhouette unchanged with borderline cardiomegaly. Atherosclerotic calcifications of the aortic arch.  Calcifications in the left hilar region representing calcified lymph nodes, present on prior CT. Surgical changes of the right upper lung.  Low lung volumes accentuates the interstitium.  Interstitial opacities throughout bilateral lungs, present on comparison studies. No confluent airspace disease. No large pleural effusion.  Continuing asymmetric elevation of the right hemidiaphragm. Left base not well evaluated.  No displaced fracture.  IMPRESSION: Low lung volumes with chronic lung  changes and no evidence of lobar pneumonia.  Atherosclerosis.  Surgical changes of the right upper lung.  Signed,  Dulcy Fanny. Earleen Newport, DO  [facility]  [facility]   Electronically Signed   By: Corrie Mckusick D.O.   On: 05/19/2014 17:41   Dg Abd Portable 2v  05/24/2014   CLINICAL DATA:  Vomiting and mid abdominal pain. Recent L4 kyphoplasty on 05/21/2014.  EXAM: PORTABLE ABDOMEN - 2 VIEW  COMPARISON:  CT of the abdomen and pelvis without contrast on 05/19/2014  FINDINGS: No evidence of bowel obstruction or significant ileus. No free air identified. Methylmethacrylate present in the L4 vertebral body. No abnormal calcifications.  IMPRESSION: No acute findings.   Electronically Signed   By: Aletta Edouard M.D.   On: 05/31/2014 14:55   Ir Kypho Vertebral Lumbar Augmentation  05/22/2014   CLINICAL DATA:  Back pain.  Compression fracture L4.  EXAM: KYPHOPLASTY AT L4  PROCEDURE: Following a full explanation of the procedure along with the potential associated complications, an informed witnessed consent was obtained.  The patient was placed prone on the fluoroscopic table. The skin overlying the lumbar region was then prepped and draped in the usual sterile fashion. The right pedicle at L4 was then infiltrated with 0.25% bupivacaine followed by the advancement of a 10.5 gauge DFine needle through the right pedicle into the posterior one-third at L4. Through this, a DFine core biopsy needle was then advanced within 5 mm of the anterior aspect of L4. Using a 20 mL syringe, a core biopsy was then obtained and sent for pathologic analysis.  Through the working needle, a DFine void creating device was then advanced and  manipulated in different directions to create a void using biplane intermittent fluoroscopy.  This was then removed. The needle was then advanced to the junction of the middle and the anterior one-third at L4.  At this time a methylmethacrylate mixture was  reconstituted with tobramycin in the DFine mixing system. This was then loaded onto the injector device. The injector device was then locked into position at the hub of the working needle.  Using biplane intermittent fluoroscopy, pulsed delivery of the methylmethacrylate mixture was then undertaken with excellent filling in the AP and lateral projections. There was no extravasation noted into the disc spaces or posteriorly into the spinal canal. No paraspinous venous extension was noted by either.  The working cannula with the injector device was then retrieved and removed. Hemostasis was achieved at the skin entry site.  The patient tolerated the procedure well. There were no acute complications.  Medications utilized: Versed 1 mg IV and Fentanyl 50 mcg micrograms IV. Dilaudid 1 mg IV.  Total Moderate Sedation Time:  45 minutes.  IMPRESSION: 1. Status post fluoroscopic-guided needle placement for deep core bone biopsy at L4.  2. Status post vertebral body augmentation using DFine kyphoplasty technique at L4 as described without event.   Electronically Signed   By: Luanne Bras M.D.   On: 05/21/2014 14:55   Ct Renal Stone Study  05/19/2014   CLINICAL DATA:  Lumbago/low back pain for 4 days. Nausea and vomiting. History of kidney stones.  EXAM: CT ABDOMEN AND PELVIS WITHOUT CONTRAST  TECHNIQUE: Multidetector CT imaging of the abdomen and pelvis was performed following the standard protocol without IV contrast.  COMPARISON:  09/08/2012.  02/14/2009.  FINDINGS: Musculoskeletal: Acute or subacute L4 inferior endplate compression fracture. Fracture plane remains crisp along the inferior endplate. Loss of vertebral body height is about 40%. Mild retropulsion. Chronic T12 compression fracture.  Lung Bases: Dependent atelectasis.  Liver: Micronodular contour of the liver with enlargement of the LEFT hepatic lobe compatible with hepatic cirrhosis. Unenhanced CT was performed per clinician order. Lack of IV contrast  limits sensitivity and specificity, especially for evaluation of abdominal/pelvic solid viscera. Tiny posterior RIGHT hepatic lobe cyst is unchanged compared to 2011.  Spleen:  Splenomegaly with old granulomatous disease.  Gallbladder:  Surgically absent.  Common bile duct:  Normal.  Pancreas:  Normal.  Adrenal glands:  Normal bilaterally.  Kidneys: Bilateral renal cysts and renal cortical atrophy. Punctate nonobstructing LEFT inferior pole renal collecting system calculus. Punctate RIGHT renal collecting system calculus versus vascular calcification. No hydronephrosis. LEFT ureter appears normal. RIGHT ureter normal.  Stomach:  Small to moderate hiatal hernia.  Small bowel:  Grossly normal.  Colon:   Diverticulosis without diverticulitis.  Pelvic Genitourinary: Prostate calcifications. Urinary bladder normal.  Peritoneum: No free fluid. Small bowel mesenteric fat stranding is present, probably secondary to cirrhosis. Prominent vessels are present in the anterior abdomen, likely secondary to portal venous hypertension.  Vascular/lymphatic: Atherosclerosis without an acute vascular abnormality by noncontrast CT. Coronary artery atherosclerosis is present. If office based assessment of coronary risk factors has not been performed, it is now recommended.  Body Wall: Normal.  IMPRESSION: 1. Acute or subacute L4 inferior endplate compression fracture with mild retropulsion. Chronic T12 compression fracture. 2. Renal cysts. 3. Hepatic cirrhosis and splenomegaly consistent with portal venous hypertension. 4. Cholecystectomy. 5. Hepatic and renal cysts. 6. Small moderate hiatal hernia. 7. Atherosclerosis and coronary artery disease. 8. Punctate nonobstructing renal collecting system calculi versus vascular calcifications.   Electronically Signed  By: Dereck Ligas M.D.   On: 05/19/2014 15:59     EKG: (Independently reviewed) sinus tachycardia, right bundle branch block, prolonged QTC 536 ms and consistent with  previous EKGs   Assessment & Plan  Principal Problem:   Sepsis -Tentatively admit to stepdown although at this juncture appears hemodynamically unstable and likely will require admission to ICU -PCCM is been consulted -Has completed 2 L IV fluid bolus will give an additional liter and continue fluids at 150 mL per hour -Continue empiric antibiotics started in the ER and follow up on blood and urine cultures -Insert Foley catheter for accurate intake and output -Stress dose steroids with Solu-Cortef 50 mg IV every 8 hours -Cycle lactic acid every 3 hours -Routine Procalcitonin noting initial Procalcitonin elevated -Differential includes healthcare acquired pneumonia versus influenza versus other unknown source -possibility also has abdominal source; in addition to right-sided abdominal pain also complaining of right hip pain-X CT abdomen and pelvis-plain abdominal films unremarkable -Patient's kyphoplasty site unremarkable  -Continue other supportive care -Follow-up on urinary strep and Legionella  Active Problems:   Chronic postoperative Adrenal insufficiency -Stress dose steroids as above    Acute renal failure superimposed on stage 3 chronic kidney disease -IV fluids -Hold diuretics -Follow labs    Essential hypertension -Currently hypotensive    Chronic diastolic heart failure, NYHA class 2 -Monitor for volume overload with volume resuscitation    GERD (gastroesophageal reflux disease) -Continue PPI    Hypothyroidism -Continue Synthroid    Compression fracture of L4 lumbar vertebra- post Kyphoplasty -Resume PT/OT once hemodynamically stable -Utilize both narcotic and nonnarcotic analgesia for pain management    DVT Prophylaxis: Subcutaneous heparin  Family Communication:   Son at bedside  Code Status:  Full code  Condition: Guarded   Discharge disposition: Hopeful can discharge back to home environment with continued PT and OT although concerned with  back-to-back admissions and severity of current illness the patient at minimum will end up in skilled nursing facility for rehabilitation pending resolution of current illness  Time spent in minutes : 60      ELLIS,ALLISON L. ANP on 05/20/2014 at 3:11 PM  Between 7am to 7pm - Pager - [phone]  After 7pm go to [email or web address] - password [email or web address]  And look for the night coverage person covering me after hours  [facility]

## 2014-05-28 NOTE — ED Notes (Signed)
Attempted I&O, unable to advance catheter past the prostate. Dr. Mingo Amber notified.

## 2014-05-28 NOTE — ED Notes (Signed)
CCM at bedside 

## 2014-05-28 NOTE — Progress Notes (Signed)
[facility] for cefepime, vancomycin Indication: rule out sepsis  Allergies  Allergen Reactions  . Penicillins Swelling and Other (See Comments)    Head and feet swelling, spent 5 days in hospital as a result of taking med    Patient Measurements: Height: 5\' 4"  (162.6 cm) Weight: 205 lb (92.987 kg) IBW/kg (Calculated) : 59.2 Adjusted Body Weight:   Vital Signs: Temp: 98.5 F (36.9 C) (05/17 0950) Temp Source: Oral (05/17 0950) BP: 100/33 mmHg (05/17 1000) Pulse Rate: 89 (05/17 1000) Intake/Output from previous day:   Intake/Output from this shift:    Labs: No results for input(s): WBC, HGB, PLT, LABCREA, CREATININE in the last 72 hours. Estimated Creatinine Clearance: 46.4 mL/min (by C-G formula based on Cr of 1.24). No results for input(s): VANCOTROUGH, VANCOPEAK, VANCORANDOM, GENTTROUGH, GENTPEAK, GENTRANDOM, TOBRATROUGH, TOBRAPEAK, TOBRARND, AMIKACINPEAK, AMIKACINTROU, AMIKACIN in the last 72 hours.   Microbiology: Recent Results (from the past 720 hour(s))  Surgical PCR screen     Status: Abnormal   Collection Time: 05/21/14  7:11 AM  Result Value Ref Range Status   MRSA, PCR NEGATIVE NEGATIVE Final   Staphylococcus aureus POSITIVE (A) NEGATIVE Final    Comment:        The Xpert SA Assay (FDA approved for NASAL specimens in patients over 33 years of age), is one component of a comprehensive surveillance program.  Test performance has been validated by [facility] for patients greater than or equal to 75 year old. It is not intended to diagnose infection nor to guide or monitor treatment.     Medical History: Past Medical History  Diagnosis Date  . Diastolic dysfunction   . History of pituitary tumor   . Pituitary insufficiency   . Hypothyroidism   . GERD (gastroesophageal reflux disease)   . Splenic flexure syndrome   . CHF (congestive heart failure)     with diastolic dysfunction  . Hiatal hernia   . Coronary artery  disease   . Cardiomyopathy   . Diverticulitis     recurrent  . Nephrolithiasis     right-sided  . Fatty liver   . Gout   . Arthritis   . Exogenous obesity   . Personal history of colonic polyps 07/23/2008    TUBULAR ADENOMA  . Ureteral stone     distal ureteral stone   . Thrombocytopenia   . Shortness of breath     Medications:   (Not in a hospital admission)   Assessment: Admitted with weakness, SOB, altered mental status, recent kyphoplasty  LA 2.93, SCr 2.2 (baseline 1-1.2), wbc normal, afebrile, soft BP, tachypneic. Initiating abx for rule out sepsis.  Goal of Therapy:  Vancomycin trough level 15-20 mcg/ml  Plan:  -Vancomycin 1500 mg IV x1 then 1 g IV q24h -Cefepime 2 g IV x1, then 1 g IV q24h -F/u CXR and MRI -F/u cultures, SCr   Hughes Better, PharmD, BCPS Clinical Pharmacist Pager: [phone] 06/11/2014 10:30 AM

## 2014-05-29 ENCOUNTER — Inpatient Hospital Stay (HOSPITAL_COMMUNITY): Payer: PPO

## 2014-05-29 DIAGNOSIS — J9601 Acute respiratory failure with hypoxia: Secondary | ICD-10-CM | POA: Insufficient documentation

## 2014-05-29 DIAGNOSIS — A419 Sepsis, unspecified organism: Secondary | ICD-10-CM | POA: Insufficient documentation

## 2014-05-29 DIAGNOSIS — R6521 Severe sepsis with septic shock: Secondary | ICD-10-CM

## 2014-05-29 LAB — POCT I-STAT 3, ART BLOOD GAS (G3+)
Acid-base deficit: 11 mmol/L — ABNORMAL HIGH (ref 0.0–2.0)
BICARBONATE: 15.6 meq/L — AB (ref 20.0–24.0)
O2 Saturation: 95 %
PO2 ART: 87 mmHg (ref 80.0–100.0)
Patient temperature: 98.1
TCO2: 17 mmol/L (ref 0–100)
pCO2 arterial: 34.5 mmHg — ABNORMAL LOW (ref 35.0–45.0)
pH, Arterial: 7.262 — ABNORMAL LOW (ref 7.350–7.450)

## 2014-05-29 LAB — CBC WITH DIFFERENTIAL/PLATELET
Basophils Absolute: 0.1 10*3/uL (ref 0.0–0.1)
Basophils Relative: 1 % (ref 0–1)
EOS ABS: 0 10*3/uL (ref 0.0–0.7)
Eosinophils Relative: 0 % (ref 0–5)
HCT: 44.9 % (ref 39.0–52.0)
Hemoglobin: 15.1 g/dL (ref 13.0–17.0)
LYMPHS PCT: 11 % — AB (ref 12–46)
Lymphs Abs: 0.7 10*3/uL (ref 0.7–4.0)
MCH: 30.6 pg (ref 26.0–34.0)
MCHC: 33.6 g/dL (ref 30.0–36.0)
MCV: 90.9 fL (ref 78.0–100.0)
MONO ABS: 0.4 10*3/uL (ref 0.1–1.0)
Monocytes Relative: 6 % (ref 3–12)
NEUTROS PCT: 82 % — AB (ref 43–77)
Neutro Abs: 4.9 10*3/uL (ref 1.7–7.7)
Platelets: 51 10*3/uL — ABNORMAL LOW (ref 150–400)
RBC: 4.94 MIL/uL (ref 4.22–5.81)
RDW: 15.8 % — ABNORMAL HIGH (ref 11.5–15.5)
WBC MORPHOLOGY: INCREASED
WBC: 6.1 10*3/uL (ref 4.0–10.5)

## 2014-05-29 LAB — COMPREHENSIVE METABOLIC PANEL
ALT: 14 U/L — ABNORMAL LOW (ref 17–63)
AST: 36 U/L (ref 15–41)
Albumin: 2.3 g/dL — ABNORMAL LOW (ref 3.5–5.0)
Alkaline Phosphatase: 103 U/L (ref 38–126)
Anion gap: 7 (ref 5–15)
BUN: 22 mg/dL — ABNORMAL HIGH (ref 6–20)
CO2: 19 mmol/L — AB (ref 22–32)
CREATININE: 2.15 mg/dL — AB (ref 0.61–1.24)
Calcium: 7.6 mg/dL — ABNORMAL LOW (ref 8.9–10.3)
Chloride: 112 mmol/L — ABNORMAL HIGH (ref 101–111)
GFR, EST AFRICAN AMERICAN: 31 mL/min — AB (ref >60)
GFR, EST NON AFRICAN AMERICAN: 27 mL/min — AB (ref >60)
GLUCOSE: 124 mg/dL — AB (ref 65–99)
POTASSIUM: 4.4 mmol/L (ref 3.5–5.1)
Sodium: 138 mmol/L (ref 135–145)
Total Bilirubin: 1.2 mg/dL (ref 0.3–1.2)
Total Protein: 5.1 g/dL — ABNORMAL LOW (ref 6.5–8.1)

## 2014-05-29 LAB — LEGIONELLA ANTIGEN, URINE

## 2014-05-29 LAB — INFLUENZA PANEL BY PCR (TYPE A & B)
H1N1FLUPCR: NOT DETECTED
INFLAPCR: NEGATIVE
INFLBPCR: NEGATIVE

## 2014-05-29 LAB — GLUCOSE, CAPILLARY
GLUCOSE-CAPILLARY: 147 mg/dL — AB (ref 65–99)
Glucose-Capillary: 155 mg/dL — ABNORMAL HIGH (ref 65–99)
Glucose-Capillary: 130 mg/dL — ABNORMAL HIGH (ref 65–99)

## 2014-05-29 LAB — URINE CULTURE
Colony Count: NO GROWTH
Culture: NO GROWTH

## 2014-05-29 LAB — LACTIC ACID, PLASMA: LACTIC ACID, VENOUS: 1.9 mmol/L (ref 0.5–2.0)

## 2014-05-29 LAB — PROCALCITONIN: PROCALCITONIN: 8.48 ng/mL

## 2014-05-29 LAB — HIV ANTIBODY (ROUTINE TESTING W REFLEX): HIV SCREEN 4TH GENERATION: NONREACTIVE

## 2014-05-29 MED ORDER — ETOMIDATE 2 MG/ML IV SOLN
20.0000 mg | Freq: Once | INTRAVENOUS | Status: AC
  Administered 2014-05-29: 20 mg via INTRAVENOUS

## 2014-05-29 MED ORDER — SODIUM CHLORIDE 0.9 % IV SOLN
25.0000 ug/h | INTRAVENOUS | Status: DC
  Administered 2014-05-29: 25 ug/h via INTRAVENOUS
  Administered 2014-05-31: 75 ug/h via INTRAVENOUS
  Filled 2014-05-29 (×2): qty 50

## 2014-05-29 MED ORDER — CETYLPYRIDINIUM CHLORIDE 0.05 % MT LIQD
7.0000 mL | Freq: Four times a day (QID) | OROMUCOSAL | Status: DC
  Administered 2014-05-29 – 2014-06-07 (×36): 7 mL via OROMUCOSAL

## 2014-05-29 MED ORDER — LEVOTHYROXINE SODIUM 100 MCG IV SOLR
62.5000 ug | Freq: Every day | INTRAVENOUS | Status: DC
  Administered 2014-05-29 – 2014-06-01 (×4): 62.5 ug via INTRAVENOUS
  Filled 2014-05-29 (×5): qty 5

## 2014-05-29 MED ORDER — ROCURONIUM BROMIDE 50 MG/5ML IV SOLN
50.0000 mg | Freq: Once | INTRAVENOUS | Status: AC
  Administered 2014-05-29: 50 mg via INTRAVENOUS

## 2014-05-29 MED ORDER — FENTANYL CITRATE (PF) 100 MCG/2ML IJ SOLN
50.0000 ug | Freq: Once | INTRAMUSCULAR | Status: DC
Start: 2014-05-29 — End: 2014-06-01

## 2014-05-29 MED ORDER — FENTANYL CITRATE (PF) 100 MCG/2ML IJ SOLN
100.0000 ug | Freq: Once | INTRAMUSCULAR | Status: AC
Start: 2014-05-29 — End: 2014-05-29
  Administered 2014-05-29: 100 ug via INTRAVENOUS

## 2014-05-29 MED ORDER — PANTOPRAZOLE SODIUM 40 MG IV SOLR
40.0000 mg | INTRAVENOUS | Status: DC
  Administered 2014-05-29 – 2014-06-01 (×4): 40 mg via INTRAVENOUS
  Filled 2014-05-29 (×4): qty 40

## 2014-05-29 MED ORDER — FENTANYL BOLUS VIA INFUSION
25.0000 ug | INTRAVENOUS | Status: DC | PRN
  Filled 2014-05-29: qty 25

## 2014-05-29 MED ORDER — NOREPINEPHRINE BITARTRATE 1 MG/ML IV SOLN
0.0000 ug/min | INTRAVENOUS | Status: DC
  Administered 2014-05-29: 15 ug/min via INTRAVENOUS
  Administered 2014-05-29: 5 ug/min via INTRAVENOUS
  Filled 2014-05-29 (×2): qty 4

## 2014-05-29 MED ORDER — MIDAZOLAM HCL 2 MG/2ML IJ SOLN: 1.0000 mg | INTRAMUSCULAR | Status: DC | PRN

## 2014-05-29 MED ORDER — FENTANYL CITRATE (PF) 100 MCG/2ML IJ SOLN
INTRAMUSCULAR | Status: AC
  Administered 2014-05-29: 100 ug via INTRAVENOUS
  Filled 2014-05-29: qty 4

## 2014-05-29 MED ORDER — OXYCODONE-ACETAMINOPHEN 5-325 MG PO TABS
2.0000 | ORAL_TABLET | Freq: Once | ORAL | Status: AC
Start: 2014-05-29 — End: 2014-05-29
  Administered 2014-05-29: 2 via ORAL
  Filled 2014-05-29: qty 2

## 2014-05-29 MED ORDER — MIDAZOLAM HCL 2 MG/2ML IJ SOLN
2.0000 mg | Freq: Once | INTRAMUSCULAR | Status: AC
  Administered 2014-05-29: 2 mg via INTRAVENOUS

## 2014-05-29 MED ORDER — CHLORHEXIDINE GLUCONATE 0.12 % MT SOLN
15.0000 mL | Freq: Two times a day (BID) | OROMUCOSAL | Status: DC
  Administered 2014-05-29 – 2014-06-07 (×18): 15 mL via OROMUCOSAL
  Filled 2014-05-29 (×15): qty 15

## 2014-05-29 MED ORDER — NOREPINEPHRINE BITARTRATE 1 MG/ML IV SOLN
0.0000 ug/min | INTRAVENOUS | Status: DC
  Administered 2014-05-29: 15 ug/min via INTRAVENOUS
  Administered 2014-05-30: 8.96 ug/min via INTRAVENOUS
  Filled 2014-05-29 (×2): qty 16

## 2014-05-29 MED ORDER — MIDAZOLAM HCL 2 MG/2ML IJ SOLN
INTRAMUSCULAR | Status: AC
  Administered 2014-05-29: 2 mg via INTRAVENOUS
  Filled 2014-05-29: qty 4

## 2014-05-29 MED ORDER — INSULIN ASPART 100 UNIT/ML ~~LOC~~ SOLN
2.0000 [IU] | SUBCUTANEOUS | Status: DC
  Administered 2014-05-29 – 2014-05-30 (×6): 2 [IU] via SUBCUTANEOUS
  Administered 2014-05-30: 4 [IU] via SUBCUTANEOUS
  Administered 2014-05-31 – 2014-06-06 (×7): 2 [IU] via SUBCUTANEOUS

## 2014-05-29 NOTE — Consult Note (Signed)
PULMONARY / CRITICAL CARE MEDICINE   Name: Scott Mooney MRN: [id number] DOB: Jun 10, 1929    ADMISSION DATE:  05/23/2014 CONSULTATION DATE:  5/187/2015  REFERRING MD :  EDP  CHIEF COMPLAINT:  SOB  INITIAL PRESENTATION: 81 year old male with recent admission for back pain during which he received kyphoplasty. He presented again to [facility] ED 5/17 complaining of SOB and back pain. He was hypotensive with elevated lactic which did not improve with IVF. CXR showed questionable LLL infiltrate. PCCM consulted for further evaluation  STUDIES:  5/17 CT abd/pel > Significant splenomegaly, trace acites with possible cirrhosis, trace pleural effusions, few small hypo densities in liver. Scattered bilateral renal cysts.  SIGNIFICANT EVENTS: 5/8 > 5/12 admission for back pain, kyphoplasty via IR 5/10.  5/17 > admitted for shock of unclear etiology  SUBJECTIVE: Lactic up overnight. Now unresponsive.  VITAL SIGNS: Temp:  [97.9 F (36.6 C)-98.7 F (37.1 C)] 97.9 F (36.6 C) (05/18 0835) Pulse Rate:  [85-105] 89 (05/18 0835) Resp:  [16-44] 16 (05/18 0835) BP: (50-136)/(21-106) 118/43 mmHg (05/18 0835) SpO2:  [90 %-99 %] 94 % (05/18 0835) HEMODYNAMICS: CVP:  [10 mmHg-17 mmHg] 16 mmHg VENTILATOR SETTINGS:   INTAKE / OUTPUT:  Intake/Output Summary (Last 24 hours) at 05/29/14 1045 Last data filed at 05/29/14 0459  Gross per 24 hour  Intake   1350 ml  Output    665 ml  Net    685 ml    PHYSICAL EXAMINATION: General:  Elderly male in NAD Neuro:  Alert, oriented, non-focal Head: [place]/AT, PERRL, no JVD noted EENT: PERRL, no JVD noted Cardiovascular:  RRR, no MRG Lungs:  Diminished R base, otherwise scant rhonchi Abdomen:  Soft, non-tender, non-distended Musculoskeletal:  No acute deformity or ROM limitation. Skin:  Bilateral lower extremity 2+ edema  LABS:  CBC  Recent Labs Lab 05/27/2014 1040 05/29/14 0453  WBC 4.3 6.1  HGB 17.5* 15.1  HCT 50.4 44.9  PLT 87* 51*   Coag's  Recent  Labs Lab 05/13/2014 1040 05/25/2014 1800  APTT  --  36  INR 1.26 1.49   BMET  Recent Labs Lab 05/23/2014 1040 05/29/14 0453  NA 138 138  K 3.9 4.4  CL 103 112*  CO2 24 19*  BUN 20 22*  CREATININE 2.20* 2.15*  GLUCOSE 94 124*   Electrolytes  Recent Labs Lab 05/30/2014 1040 05/29/14 0453  CALCIUM 8.7* 7.6*   Sepsis Markers  Recent Labs Lab 05/21/2014 1314 06/04/2014 1406 06/03/2014 1510 06/03/2014 1738  LATICACIDVEN 2.74*  --  3.8* 3.2*  PROCALCITON  --  4.63  --   --    ABG  Recent Labs Lab 05/25/2014 1241  PHART 7.386  PCO2ART 29.1*  PO2ART 78.0*   Liver Enzymes  Recent Labs Lab 05/29/2014 1040 05/29/14 0453  AST 44* 36  ALT 17 14*  ALKPHOS 137* 103  BILITOT 1.3* 1.2  ALBUMIN 3.1* 2.3*   Cardiac Enzymes No results for input(s): TROPONINI, PROBNP in the last 168 hours. Glucose No results for input(s): GLUCAP in the last 168 hours.  Imaging Ct Abdomen Pelvis Wo Contrast  05/15/2014   CLINICAL DATA:  Acute onset of sepsis. Lower back pain and shortness of breath. Hypotension. Elevated lactic acid. Initial encounter.  EXAM: CT ABDOMEN AND PELVIS WITHOUT CONTRAST  TECHNIQUE: Multidetector CT imaging of the abdomen and pelvis was performed following the standard protocol without IV contrast.  COMPARISON:  CT of the abdomen and pelvis from 05/19/2014  FINDINGS: Trace bilateral pleural effusions are  noted, left greater than right, with scattered scarring and calcification at the right lung base. Scattered coronary artery calcifications are seen.  Trace ascites is noted about the liver. The mildly nodular contour of the liver could reflect minimal cirrhotic change. A few nonspecific small hypodensities are seen within the liver, measuring up to 1.1 cm in size. The spleen is significantly enlarged, measuring 20.9 cm in length, with a few scattered calcified granulomata. The patient is status post cholecystectomy, with clips noted along the gallbladder fossa. The pancreas and  adrenal glands are unremarkable.  Scattered bilateral renal cysts measure up to 4.0 cm in size. Mild nonspecific perinephric stranding is seen bilaterally. There is no evidence of hydronephrosis. No definite renal or ureteral stones are seen.  The small bowel is unremarkable in appearance. The stomach is within normal limits. No acute vascular abnormalities are seen. Scattered calcification is seen along the abdominal aorta and its branches.  The appendix is normal in caliber, without evidence of appendicitis. Contrast progresses to the level of the sigmoid colon. Two tiny diverticula are noted at the mid sigmoid colon. The colon is otherwise unremarkable in appearance.  The bladder is relatively decompressed, with a Foley catheter in place. The prostate is calcified and difficult to fully assess. No inguinal lymphadenopathy is seen.  No acute osseous abnormalities are identified. There is marked chronic compression deformity involving vertebral body T12, and changes of vertebroplasty at L4, with underlying compression deformity.  IMPRESSION: 1. No acute abnormality seen to explain the patient's symptoms. 2. Trace ascites noted about the liver. Mildly nodular contour of the liver could reflect minimal cirrhotic change. 3. Significant splenomegaly; the spleen measures 20.9 cm in length. 4. Trace bilateral pleural effusions, left greater than right, with scattered scarring and calcification at the right lung base. 5. Scattered coronary artery calcifications seen. 6. Few nonspecific small hypodensities within the liver, measuring up to 1.1 cm in size. 7. Scattered bilateral renal cysts noted. 8. Marked chronic compression deformity at T12, and changes of vertebroplasty and underlying compression deformity at L4.   Electronically Signed   By: Garald Balding M.D.   On: 05/19/2014 21:03   Dg Chest Port 1 View  06/06/2014   CLINICAL DATA:  Central line placement.  Initial encounter.  EXAM: PORTABLE CHEST - 1 VIEW   COMPARISON:  Chest radiograph performed earlier today at 10:35 a.m.  FINDINGS: The patient's left IJ line is noted ending about the cavoatrial junction.  The lungs are hypoexpanded. Left basilar airspace opacification may reflect mild pneumonia; would correlate for associated symptoms. Underlying atelectasis is seen. A small left pleural effusion is noted. No pneumothorax is identified.  The cardiomediastinal silhouette is mildly enlarged. No acute osseous abnormalities are seen.  IMPRESSION: 1. Left IJ line noted ending about the cavoatrial junction. 2. Lungs hypoexpanded. Left basilar airspace opacification may reflect mild pneumonia. Would correlate for associated symptoms. Underlying atelectasis seen. Small left pleural effusion noted. 3. Mild cardiomegaly.   Electronically Signed   By: Garald Balding M.D.   On: 05/16/2014 17:47   Dg Chest Port 1 View  06/03/2014   CLINICAL DATA:  81 year old male with a history of back surgery.  EXAM: PORTABLE CHEST - 1 VIEW  COMPARISON:  05/19/2014  FINDINGS: Cardiomediastinal silhouette is unchanged. Atherosclerotic calcifications of the aortic arch.  Low lung volumes, with persistent elevation of the right hemidiaphragm. Retrocardiac region not well evaluated.  Coarsened interstitial markings. No evidence of confluent airspace disease or pneumothorax.  No displaced fracture.  IMPRESSION: Low lung volumes with chronic changes and without evidence of lobar pneumonia. Left base not well evaluated and if there is concern for acute process, a formal PA and lateral chest x-ray may be useful.  Atherosclerosis.  Signed,  Dulcy Fanny. Earleen Newport, DO  [facility]  [facility]   Electronically Signed   By: Corrie Mckusick D.O.   On: 05/27/2014 10:40   Dg Abd Portable 2v  06/01/2014   CLINICAL DATA:  Vomiting and mid abdominal pain. Recent L4 kyphoplasty on 05/21/2014.  EXAM: PORTABLE ABDOMEN - 2 VIEW  COMPARISON:  CT of the abdomen and pelvis  without contrast on 05/19/2014  FINDINGS: No evidence of bowel obstruction or significant ileus. No free air identified. Methylmethacrylate present in the L4 vertebral body. No abnormal calcifications.  IMPRESSION: No acute findings.   Electronically Signed   By: Aletta Edouard M.D.   On: 05/17/2014 14:55     ASSESSMENT / PLAN:  PULMONARY A: Acute hypoxemic/hypercarbic respiratory failure  P:   - Transfer to ICU - STAT intubation - CXR for ETT placement - ABG - VAP bundle  CARDIOVASCULAR CVL L IJ TLC 5/17>>> A:  Circulatory shock, multifactorial due to to withdrawal from steroids, hypovolemia > improved  P:  - Tele monitoring - CVP monitoring,  goal 8-12 - MAP goal > 36mm/Hg - Levophed for BP support. - Follow lactic acid  RENAL A:   Acute renal failure, suspect dehydration > improving  P:   - IVF resuscitation. - BMET in AM. - Replace electrolytes as indicated.  GASTROINTESTINAL A:   Abdominal pain, unknown cause.  P:   - Ct/abd neg - NPO including PO meds - IV protonix for SUP  HEMATOLOGIC A:   Hemoconcentration > resolved  P:  - follow CBC - VTE ppx: SUBQ heparin  INFECTIOUS A:   Source of infection unknown. PNA LLL?  P:   BCx2 5/17>>> UC 5/17>>> Sputum 5/17>>> ABX: Cefepime 5/17>>> ABX: Vanc 5/17>>> Trend PCT  ENDOCRINE A:   Adrenal insufficiency due to a pituitary tumor.    P:   Stress dose steroids CBG monitoring and SSI  NEUROLOGIC A:   Acute metabolic encephalopathy 2nd to hypercarbia  P:   RASS goal -1 Fentanyl gtt PRN versed Monitor   FAMILY  - Updates: Family updated extensively about critical status and move to ICU, need for ventilator. Full code.   [name], AGACNP-BC [facility] Pulmonology/Critical Care Pager [phone] or [phone]  05/29/2014 10:57 AM  Called to evaluate patient bedside.  He is not moving much air, very lethargic, arousable.  Diffuse crackles in all lung fields.  Will transfer to  the ICU, intubate and full mechanical support.  Spoke with the son, wishes for full code status.  Will continue abx.  F/U on culture.  Pressors for BP support.  Continue stress dose steroids.  CXR post intubation and ABG.  Adjust vent for ABG.  The patient is critically ill with multiple organ systems failure and requires high complexity decision making for assessment and support, frequent evaluation and titration of therapies, application of advanced monitoring technologies and extensive interpretation of multiple databases.   Critical Care Time devoted to patient care services described in this note is  35  Minutes. This time reflects time of care of this signee Dr [name]. This critical care time does not reflect procedure time, or teaching time or supervisory time of PA/NP/Med student/Med Resident etc but could involve care discussion time.  Elzie Sheets  Kathryne Sharper, M.D. [facility] Pulmonary/Critical Care Medicine. Pager: [phone]. After hours pager: [phone].

## 2014-05-29 NOTE — Progress Notes (Signed)
RT retracted patient ETT 2cm per MD order. Patient tolerated well. No complications. Vital signs stable throughout. Family and RN and bedside. RT will continue to monitor.

## 2014-05-29 NOTE — Procedures (Signed)
Intubation Procedure Note Scott Mooney [id number] May 06, 1929  Procedure: Intubation Indications: Respiratory insufficiency  Procedure Details Consent: Risks of procedure as well as the alternatives and risks of each were explained to the (patient/caregiver).  Consent for procedure obtained. Time Out: Verified patient identification, verified procedure, site/side was marked, verified correct patient position, special equipment/implants available, medications/allergies/relevent history reviewed, required imaging and test results available.  Performed  Maximum sterile technique was used including gloves, hand hygiene and mask.  MAC    Evaluation Hemodynamic Status: BP stable throughout; O2 sats: stable throughout Patient's Current Condition: stable Complications: No apparent complications Patient did tolerate procedure well. Chest X-ray ordered to verify placement.  CXR: pending.   Scott Mooney 05/29/2014

## 2014-05-29 NOTE — Progress Notes (Signed)
pts urine was very dark/tea colored at the beginning of the shift.  RN found pt yanking on foley catheter around midnight.  At 0300 pts urine turned very pink/red.  Urine cleared to yellow by 0530.

## 2014-05-30 ENCOUNTER — Inpatient Hospital Stay (HOSPITAL_COMMUNITY): Payer: PPO

## 2014-05-30 DIAGNOSIS — A419 Sepsis, unspecified organism: Principal | ICD-10-CM

## 2014-05-30 DIAGNOSIS — J9601 Acute respiratory failure with hypoxia: Secondary | ICD-10-CM

## 2014-05-30 DIAGNOSIS — N183 Chronic kidney disease, stage 3 (moderate): Secondary | ICD-10-CM

## 2014-05-30 DIAGNOSIS — N179 Acute kidney failure, unspecified: Secondary | ICD-10-CM

## 2014-05-30 DIAGNOSIS — R6521 Severe sepsis with septic shock: Secondary | ICD-10-CM

## 2014-05-30 DIAGNOSIS — A408 Other streptococcal sepsis: Secondary | ICD-10-CM

## 2014-05-30 LAB — BLOOD GAS, ARTERIAL
ACID-BASE DEFICIT: 10.7 mmol/L — AB (ref 0.0–2.0)
Bicarbonate: 14.6 mEq/L — ABNORMAL LOW (ref 20.0–24.0)
Drawn by: 270271
FIO2: 0.4 %
LHR: 18 {breaths}/min
MECHVT: 540 mL
O2 Saturation: 98 %
PCO2 ART: 31.9 mmHg — AB (ref 35.0–45.0)
PEEP/CPAP: 5 cmH2O
Patient temperature: 98.6
TCO2: 15.6 mmol/L (ref 0–100)
pH, Arterial: 7.284 — ABNORMAL LOW (ref 7.350–7.450)
pO2, Arterial: 116 mmHg — ABNORMAL HIGH (ref 80.0–100.0)

## 2014-05-30 LAB — BASIC METABOLIC PANEL
ANION GAP: 9 (ref 5–15)
BUN: 32 mg/dL — ABNORMAL HIGH (ref 6–20)
CHLORIDE: 113 mmol/L — AB (ref 101–111)
CO2: 15 mmol/L — ABNORMAL LOW (ref 22–32)
Calcium: 6.9 mg/dL — ABNORMAL LOW (ref 8.9–10.3)
Creatinine, Ser: 1.99 mg/dL — ABNORMAL HIGH (ref 0.61–1.24)
GFR calc non Af Amer: 29 mL/min — ABNORMAL LOW (ref >60)
GFR, EST AFRICAN AMERICAN: 34 mL/min — AB (ref >60)
Glucose, Bld: 134 mg/dL — ABNORMAL HIGH (ref 65–99)
POTASSIUM: 4.5 mmol/L (ref 3.5–5.1)
SODIUM: 137 mmol/L (ref 135–145)

## 2014-05-30 LAB — CBC
HCT: 50.1 % (ref 39.0–52.0)
Hemoglobin: 17 g/dL (ref 13.0–17.0)
MCH: 30.9 pg (ref 26.0–34.0)
MCHC: 33.9 g/dL (ref 30.0–36.0)
MCV: 90.9 fL (ref 78.0–100.0)
PLATELETS: 46 10*3/uL — AB (ref 150–400)
RBC: 5.51 MIL/uL (ref 4.22–5.81)
RDW: 16.3 % — AB (ref 11.5–15.5)
WBC: 10.4 10*3/uL (ref 4.0–10.5)

## 2014-05-30 LAB — GLUCOSE, CAPILLARY
GLUCOSE-CAPILLARY: 110 mg/dL — AB (ref 65–99)
GLUCOSE-CAPILLARY: 118 mg/dL — AB (ref 65–99)
GLUCOSE-CAPILLARY: 120 mg/dL — AB (ref 65–99)
GLUCOSE-CAPILLARY: 134 mg/dL — AB (ref 65–99)
Glucose-Capillary: 125 mg/dL — ABNORMAL HIGH (ref 65–99)
Glucose-Capillary: 138 mg/dL — ABNORMAL HIGH (ref 65–99)

## 2014-05-30 LAB — PHOSPHORUS
PHOSPHORUS: 3.8 mg/dL (ref 2.5–4.6)
Phosphorus: 3 mg/dL (ref 2.5–4.6)

## 2014-05-30 LAB — URINALYSIS, ROUTINE W REFLEX MICROSCOPIC
Bilirubin Urine: NEGATIVE
GLUCOSE, UA: NEGATIVE mg/dL
Ketones, ur: NEGATIVE mg/dL
LEUKOCYTES UA: NEGATIVE
Nitrite: NEGATIVE
PH: 5 (ref 5.0–8.0)
PROTEIN: 30 mg/dL — AB
Specific Gravity, Urine: 1.013 (ref 1.005–1.030)
Urobilinogen, UA: 0.2 mg/dL (ref 0.0–1.0)

## 2014-05-30 LAB — PROCALCITONIN: PROCALCITONIN: 7.49 ng/mL

## 2014-05-30 LAB — URINE MICROSCOPIC-ADD ON

## 2014-05-30 LAB — MAGNESIUM
MAGNESIUM: 1.6 mg/dL — AB (ref 1.7–2.4)
Magnesium: 1.7 mg/dL (ref 1.7–2.4)

## 2014-05-30 MED ORDER — PRO-STAT SUGAR FREE PO LIQD
30.0000 mL | Freq: Two times a day (BID) | ORAL | Status: DC
  Filled 2014-05-30: qty 30

## 2014-05-30 MED ORDER — PRO-STAT SUGAR FREE PO LIQD
60.0000 mL | Freq: Every day | ORAL | Status: DC
  Administered 2014-05-30 – 2014-06-06 (×34): 60 mL
  Filled 2014-05-30 (×39): qty 60

## 2014-05-30 MED ORDER — VITAL HIGH PROTEIN PO LIQD
1000.0000 mL | ORAL | Status: DC
  Administered 2014-05-30 – 2014-06-05 (×8): 1000 mL
  Administered 2014-06-05: 06:00:00
  Filled 2014-05-30 (×9): qty 1000

## 2014-05-30 MED ORDER — VITAL HIGH PROTEIN PO LIQD
1000.0000 mL | ORAL | Status: DC
  Filled 2014-05-30 (×2): qty 1000

## 2014-05-30 NOTE — Progress Notes (Signed)
Attempted to SBT trials on pt, pt initially tolerated for about 1 minute, then pt developed increased RR 33-38, agitation, increased HR.  Attempted to increase PS, pt did not tolerate, placed pt back on full vent support.  Pt calmed down, VSS now on full vent support, sat 98%, HR 68.

## 2014-05-30 NOTE — Progress Notes (Signed)
PULMONARY / CRITICAL CARE MEDICINE   Name: Scott Mooney MRN: [id number] DOB: September 05, 1929    ADMISSION DATE:  06/05/2014 CONSULTATION DATE:  5/187/2015  REFERRING MD :  EDP  CHIEF COMPLAINT:  SOB  INITIAL PRESENTATION: 81 year old male with recent admission for back pain during which he received kyphoplasty. He presented again to [facility] ED 5/17 complaining of SOB and back pain. He was hypotensive with elevated lactic which did not improve with IVF. CXR showed questionable LLL infiltrate. PCCM consulted for further evaluation  STUDIES:  5/17 CT abd/pel > Significant splenomegaly, trace acites with possible cirrhosis, trace pleural effusions, few small hypo densities in liver. Scattered bilateral renal cysts.  SIGNIFICANT EVENTS: 5/8 > 5/12 admission for back pain, kyphoplasty via IR 5/10.  5/17 > admitted for shock of unclear etiology 5/19 CT spine lumbar >   SUBJECTIVE: pressor requirement improved  VITAL SIGNS: Temp:  [97.6 F (36.4 C)-98.7 F (37.1 C)] 98.7 F (37.1 C) (05/19 1134) Pulse Rate:  [41-109] 59 (05/19 1200) Resp:  [4-33] 17 (05/19 1200) BP: (84-185)/(31-69) 130/49 mmHg (05/19 1200) SpO2:  [96 %-100 %] 98 % (05/19 1200) FiO2 (%):  [40 %] 40 % (05/19 1125) HEMODYNAMICS: CVP:  [8 mmHg-22 mmHg] 8 mmHg VENTILATOR SETTINGS: Vent Mode:  [-] PRVC FiO2 (%):  [40 %] 40 % Set Rate:  [18 bmp] 18 bmp Vt Set:  [540 mL] 540 mL PEEP:  [5 cmH20] 5 cmH20 Plateau Pressure:  [15 cmH20-21 cmH20] 15 cmH20 INTAKE / OUTPUT:  Intake/Output Summary (Last 24 hours) at 05/30/14 1320 Last data filed at 05/30/14 1200  Gross per 24 hour  Intake 5859.86 ml  Output   1020 ml  Net 4839.86 ml    PHYSICAL EXAMINATION: General:  Sedated on vent HENT: NCAT ETT in place PULM: CTA B CV RRR, no mgr GI: BS+, soft, nontender MSK: normal bulk and tone Derm: petchecial, non rasied non-tender linear rash bilateral thighs;  Neuro: sedated, arouses easily on vent   LABS:  CBC  Recent  Labs Lab 06/02/2014 1040 05/29/14 0453 05/30/14 0440  WBC 4.3 6.1 10.4  HGB 17.5* 15.1 17.0  HCT 50.4 44.9 50.1  PLT 87* 51* 46*   Coag's  Recent Labs Lab 06/05/2014 1040 05/22/2014 1800  APTT  --  36  INR 1.26 1.49   BMET  Recent Labs Lab 05/13/2014 1040 05/29/14 0453 05/30/14 0440  NA 138 138 137  K 3.9 4.4 4.5  CL 103 112* 113*  CO2 24 19* 15*  BUN 20 22* 32*  CREATININE 2.20* 2.15* 1.99*  GLUCOSE 94 124* 134*   Electrolytes  Recent Labs Lab 05/24/2014 1040 05/29/14 0453 05/30/14 0440  CALCIUM 8.7* 7.6* 6.9*  MG  --   --  1.7  PHOS  --   --  3.8   Sepsis Markers  Recent Labs Lab 06/09/2014 1406 06/08/2014 1510 06/08/2014 1738 05/29/14 1142 05/29/14 1330 05/30/14 0440  LATICACIDVEN  --  3.8* 3.2*  --  1.9  --   PROCALCITON 4.63  --   --  8.48  --  7.49   ABG  Recent Labs Lab 05/21/2014 1241 05/29/14 1251 05/30/14 0440  PHART 7.386 7.262* 7.284*  PCO2ART 29.1* 34.5* 31.9*  PO2ART 78.0* 87.0 116*   Liver Enzymes  Recent Labs Lab 05/26/2014 1040 05/29/14 0453  AST 44* 36  ALT 17 14*  ALKPHOS 137* 103  BILITOT 1.3* 1.2  ALBUMIN 3.1* 2.3*   Cardiac Enzymes No results for input(s): TROPONINI, PROBNP in  the last 168 hours. Glucose  Recent Labs Lab 05/29/14 1537 05/29/14 1932 05/29/14 2348 05/30/14 0416 05/30/14 0815 05/30/14 1132  GLUCAP 130* 147* 120* 125* 118* 134*    Imaging 5/19 CXR images personally reviewed> ETT in place, low lung volumes, cardiac silhouette normal, no clear infiltrate   ASSESSMENT / PLAN:  PULMONARY A: Acute hypoxemic/hypercarbic respiratory failure HCAP? > no clear infiltrate on my reviewe P:   Full vent support Daily SBT/WUA/CXR  CARDIOVASCULAR CVL L IJ TLC 5/17>>> A:  Septic shock complicated by adrenal insufficiency > improving Mild to moderate MR 2014 echo P:  Continue levophed for MAP > 65 Tele  RENAL A:   Acute renal failure, suspect volume depletion > improving  P:   Continue IVF  resuscitation Monitor BMET and UOP Replace electrolytes as needed  GASTROINTESTINAL A:   Abdominal pain, unknown cause > CT negative P:   Advance tube feedings Continue PPI  HEMATOLOGIC A:   Chronic thrombocytopenia> stable, petechial rash of uncertain etiology No clear bleeding  P:  SCD for DVT prophylaxis Monitor for bleeding Daily CBC  INFECTIOUS A:   Source of infection unknown. PNA LLL?  P:   BCx2 5/17>>> UC 5/17>>> Sputum 5/17>>> ABX: Cefepime 5/17>>> ABX: Vanc 5/17>>> Trend PCT  ENDOCRINE A:   Adrenal insufficiency due to a pituitary tumor   P:   Continue Stress dose steroids CBG monitoring and SSI  NEUROLOGIC A:   Acute metabolic encephalopathy 2nd to hypercarbia  P:   RASS goal -1 Fentanyl gtt PRN versed Monitor   FAMILY  - Updates: Family updated extensively about critical status and move to ICU on 5/18, no available 5/19   My cc time 40 minutes  Roselie Awkward, MD [place] PCCM Pager: [phone] Cell: [phone] If no response, call [phone]

## 2014-05-30 NOTE — Care Management Note (Addendum)
Case Management Note  Patient Details  Name: Scott Mooney MRN: [id number] Date of Birth: 12/14/1929  Subjective/Objective:                  Now on vent.   Refused SNF last admission and was sent home with wifr and  HH RN, PT, OT and NA from [facility] .  May need higher level of care.  Sw consult placed.  CM will continue to follow.   Action/Plan:   Expected Discharge Date:                  Expected Discharge Plan:  Long Term Acute Care (LTAC)  In-House Referral:     Discharge planning Services     Post Acute Care Choice:    Choice offered to:     DME Arranged:    DME Agency:     HH Arranged:    HH Agency:     Status of Service:  In process, will continue to follow  Medicare Important Message Given:    Date Medicare IM Given:    Medicare IM give by:    Date Additional Medicare IM Given:    Additional Medicare Important Message give by:     If discussed at [place] of Stay Meetings, dates discussed:    Additional Comments:  Vergie Living, RN 05/30/2014, 2:45 PM

## 2014-05-30 NOTE — Progress Notes (Signed)
Initial Nutrition Assessment  DOCUMENTATION CODES:  Obesity unspecified  INTERVENTION:  Initiate TF via NG tube with Vital High Protein at 15 ml/h (360 ml) and Prostat 60 ml five times per day Provides: 1360 kcal, 181 grams protein, and 300 ml H2O.  NUTRITION DIAGNOSIS:  Inadequate oral intake related to inability to eat as evidenced by NPO status.   GOAL:  Provide needs based on ASPEN/SCCM guidelines   MONITOR:  Vent status, Labs, TF tolerance, I & O's  REASON FOR ASSESSMENT:  Consult Enteral/tube feeding initiation and management  ASSESSMENT:  Pt with recent admission for back pain s/p kyphoplasty, readmitted with SOB, back pain, hypotension and possible LLL infiltrate.   Patient is currently intubated on ventilator support MV: 10 L/min Temp (24hrs), Avg:98.2 F (36.8 C), Min:97.6 F (36.4 C), Max:98.7 F (37.1 C)  Nutrition-Focused physical exam completed. Findings are no fat depletion, no muscle depletion, and mild edema.  Labs reviewed:  BUN/Cr elevated, CBG's: 120-147 Medications reviewed and include: solu-cortef, novolog, synthroid   Height:  Ht Readings from Last 1 Encounters:  06/08/2014 5\' 4"  (1.626 m)    Weight:  Wt Readings from Last 1 Encounters:  05/12/2014 205 lb (92.987 kg)    Ideal Body Weight:  59 kg  Wt Readings from Last 10 Encounters:  05/18/2014 205 lb (92.987 kg)  05/23/14 205 lb 0.4 oz (93 kg)  07/19/13 198 lb (89.812 kg)  07/10/13 204 lb 2.3 oz (92.6 kg)  02/05/13 205 lb (92.987 kg)  10/31/12 210 lb (95.255 kg)  08/07/12 223 lb 6.4 oz (101.334 kg)  07/16/12 237 lb 9.6 oz (107.775 kg)  06/22/12 241 lb 12.8 oz (109.68 kg)  05/25/12 240 lb 1.9 oz (108.918 kg)    BMI:  Body mass index is 35.17 kg/(m^2).  Estimated Nutritional Needs:  Kcal:  1021-1300  Protein:  >/= 183 grams   Fluid:  >1.5 L/day  Skin:  Reviewed, no issues  Diet Order:  Diet NPO time specified  EDUCATION NEEDS:  No education needs identified at  this time   Intake/Output Summary (Last 24 hours) at 05/30/14 1432 Last data filed at 05/30/14 1341  Gross per 24 hour  Intake 4625.45 ml  Output   1145 ml  Net 3480.45 ml    Last BM:  5/18  Maylon Peppers RD, LDN, [place] Pager [phone] After Hours Pager

## 2014-05-31 DIAGNOSIS — D696 Thrombocytopenia, unspecified: Secondary | ICD-10-CM

## 2014-05-31 LAB — CBC
HCT: 45.2 % (ref 39.0–52.0)
Hemoglobin: 14.9 g/dL (ref 13.0–17.0)
MCH: 30.2 pg (ref 26.0–34.0)
MCHC: 33 g/dL (ref 30.0–36.0)
MCV: 91.5 fL (ref 78.0–100.0)
Platelets: 19 10*3/uL — CL (ref 150–400)
RBC: 4.94 MIL/uL (ref 4.22–5.81)
RDW: 16.7 % — AB (ref 11.5–15.5)
WBC: 4.9 10*3/uL (ref 4.0–10.5)

## 2014-05-31 LAB — HEPATIC FUNCTION PANEL
ALT: 13 U/L — AB (ref 17–63)
AST: 21 U/L (ref 15–41)
Albumin: 2 g/dL — ABNORMAL LOW (ref 3.5–5.0)
Alkaline Phosphatase: 80 U/L (ref 38–126)
BILIRUBIN DIRECT: 0.4 mg/dL (ref 0.1–0.5)
BILIRUBIN INDIRECT: 0.9 mg/dL (ref 0.3–0.9)
BILIRUBIN TOTAL: 1.3 mg/dL — AB (ref 0.3–1.2)
Total Protein: 5.1 g/dL — ABNORMAL LOW (ref 6.5–8.1)

## 2014-05-31 LAB — BASIC METABOLIC PANEL
ANION GAP: 6 (ref 5–15)
Anion gap: 5 (ref 5–15)
BUN: 35 mg/dL — ABNORMAL HIGH (ref 6–20)
BUN: 41 mg/dL — ABNORMAL HIGH (ref 6–20)
CHLORIDE: 119 mmol/L — AB (ref 101–111)
CO2: 17 mmol/L — AB (ref 22–32)
CO2: 18 mmol/L — ABNORMAL LOW (ref 22–32)
CREATININE: 1.56 mg/dL — AB (ref 0.61–1.24)
CREATININE: 1.56 mg/dL — AB (ref 0.61–1.24)
Calcium: 7.7 mg/dL — ABNORMAL LOW (ref 8.9–10.3)
Calcium: 7.9 mg/dL — ABNORMAL LOW (ref 8.9–10.3)
Chloride: 119 mmol/L — ABNORMAL HIGH (ref 101–111)
GFR calc Af Amer: 46 mL/min — ABNORMAL LOW (ref >60)
GFR calc non Af Amer: 39 mL/min — ABNORMAL LOW (ref >60)
GFR calc non Af Amer: 39 mL/min — ABNORMAL LOW (ref >60)
GFR, EST AFRICAN AMERICAN: 46 mL/min — AB (ref >60)
Glucose, Bld: 148 mg/dL — ABNORMAL HIGH (ref 65–99)
Glucose, Bld: 149 mg/dL — ABNORMAL HIGH (ref 65–99)
POTASSIUM: 4 mmol/L (ref 3.5–5.1)
Potassium: 4 mmol/L (ref 3.5–5.1)
Sodium: 141 mmol/L (ref 135–145)
Sodium: 143 mmol/L (ref 135–145)

## 2014-05-31 LAB — GLUCOSE, CAPILLARY
GLUCOSE-CAPILLARY: 109 mg/dL — AB (ref 65–99)
GLUCOSE-CAPILLARY: 132 mg/dL — AB (ref 65–99)
GLUCOSE-CAPILLARY: 136 mg/dL — AB (ref 65–99)
GLUCOSE-CAPILLARY: 167 mg/dL — AB (ref 65–99)
Glucose-Capillary: 137 mg/dL — ABNORMAL HIGH (ref 65–99)
Glucose-Capillary: 103 mg/dL — ABNORMAL HIGH (ref 65–99)

## 2014-05-31 LAB — PHOSPHORUS
Phosphorus: 3 mg/dL (ref 2.5–4.6)
Phosphorus: 2.8 mg/dL (ref 2.5–4.6)

## 2014-05-31 LAB — MAGNESIUM
Magnesium: 1.8 mg/dL (ref 1.7–2.4)
Magnesium: 1.8 mg/dL (ref 1.7–2.4)

## 2014-05-31 LAB — PROCALCITONIN: PROCALCITONIN: 4.84 ng/mL

## 2014-05-31 MED ORDER — DOXYCYCLINE HYCLATE 100 MG PO TABS
100.0000 mg | ORAL_TABLET | Freq: Two times a day (BID) | ORAL | Status: DC
  Administered 2014-05-31 – 2014-06-01 (×3): 100 mg via ORAL
  Filled 2014-05-31 (×4): qty 1

## 2014-05-31 NOTE — Progress Notes (Signed)
CRITICAL VALUE ALERT  Critical value received:  Platelet 19  Date of notification:  05/31/14  Time of notification:  1840  Critical value read back:Yes.    Nurse who received alert:  Jearl Klinefelter RN  MD notified (1st page):  Dr. Lake Bells  Time of first page:  1429  MD notified (2nd page):  Time of second page:  Responding MD:  Dr. Lake Bells    Time MD responded:  [phone]

## 2014-05-31 NOTE — Progress Notes (Signed)
[facility] PCCM  Updated son and HCPOA about situation. Mr. Fluke is nearing extubation, but his likelihood of re-intubation is fairly high given his comorbid illnesses and advanced age.  The family plans to discuss code status and let us know what they decide.  Continue full vent support for now.  Roselie Awkward, MD [place] PCCM Pager: [phone] Cell: [phone] If no response, call [phone]

## 2014-05-31 NOTE — Progress Notes (Signed)
PULMONARY / CRITICAL CARE MEDICINE   Name: SEVAG SHEARN MRN: [id number] DOB: 1929-12-15    ADMISSION DATE:  05/16/2014 CONSULTATION DATE:  5/187/2015  REFERRING MD :  EDP  CHIEF COMPLAINT:  SOB  INITIAL PRESENTATION: 81 year old male with recent admission for back pain during which he received kyphoplasty. He presented again to [facility] ED 5/17 complaining of SOB and back pain. He was hypotensive with elevated lactic which did not improve with IVF. CXR showed questionable LLL infiltrate. PCCM consulted for further evaluation  STUDIES:  5/17 CT abd/pel > Significant splenomegaly, trace acites with possible cirrhosis, trace pleural effusions, few small hypo densities in liver. Scattered bilateral renal cysts.  SIGNIFICANT EVENTS: 5/8 > 5/12 admission for back pain, kyphoplasty via IR 5/10.  5/17 > admitted for shock of unclear etiology  SUBJECTIVE: pressor requirement improved, following commands, passing SBT  VITAL SIGNS: Temp:  [97.9 F (36.6 C)-98.7 F (37.1 C)] 97.9 F (36.6 C) (05/20 0734) Pulse Rate:  [29-66] 58 (05/20 0900) Resp:  [7-23] 11 (05/20 0900) BP: (97-147)/(32-88) 123/49 mmHg (05/20 0900) SpO2:  [97 %-99 %] 98 % (05/20 0900) FiO2 (%):  [40 %] 40 % (05/20 0900) Weight:  [103.1 kg (227 lb 4.7 oz)] 103.1 kg (227 lb 4.7 oz) (05/20 0500) HEMODYNAMICS: CVP:  [7 mmHg-18 mmHg] 14 mmHg VENTILATOR SETTINGS: Vent Mode:  [-] PSV;CPAP FiO2 (%):  [40 %] 40 % Set Rate:  [18 bmp] 18 bmp Vt Set:  [540 mL] 540 mL PEEP:  [5 cmH20] 5 cmH20 Pressure Support:  [5 cmH20] 5 cmH20 Plateau Pressure:  [12 cmH20-18 cmH20] 18 cmH20 INTAKE / OUTPUT:  Intake/Output Summary (Last 24 hours) at 05/31/14 1001 Last data filed at 05/31/14 0900  Gross per 24 hour  Intake 3190.47 ml  Output   1145 ml  Net 2045.47 ml    PHYSICAL EXAMINATION: General:  Awake on vent HENT: NCAT ETT in place PULM: CTA B CV RRR, no mgr GI: BS+, soft, nontender MSK: normal bulk and tone Derm: petchecial,  non rasied non-tender linear rash bilateral thighs; worse on 5/20 (now on feet, soles) Neuro: awake and alert, but slow to follow commands, does not raise head on command   LABS:  CBC  Recent Labs Lab 05/25/2014 1040 05/29/14 0453 05/30/14 0440  WBC 4.3 6.1 10.4  HGB 17.5* 15.1 17.0  HCT 50.4 44.9 50.1  PLT 87* 51* 46*   Coag's  Recent Labs Lab 06/08/2014 1040 05/15/2014 1800  APTT  --  36  INR 1.26 1.49   BMET  Recent Labs Lab 06/09/2014 1040 05/29/14 0453 05/30/14 0440  NA 138 138 137  K 3.9 4.4 4.5  CL 103 112* 113*  CO2 24 19* 15*  BUN 20 22* 32*  CREATININE 2.20* 2.15* 1.99*  GLUCOSE 94 124* 134*   Electrolytes  Recent Labs Lab 06/05/2014 1040 05/29/14 0453 05/30/14 0440 05/30/14 1525 05/31/14 0138  CALCIUM 8.7* 7.6* 6.9*  --   --   MG  --   --  1.7 1.6* 1.8  PHOS  --   --  3.8 3.0 3.0   Sepsis Markers  Recent Labs Lab 06/06/2014 1510 05/27/2014 1738 05/29/14 1142 05/29/14 1330 05/30/14 0440 05/31/14 0330  LATICACIDVEN 3.8* 3.2*  --  1.9  --   --   PROCALCITON  --   --  8.48  --  7.49 4.84   ABG  Recent Labs Lab 05/27/2014 1241 05/29/14 1251 05/30/14 0440  PHART 7.386 7.262* 7.284*  PCO2ART 29.1*  34.5* 31.9*  PO2ART 78.0* 87.0 116*   Liver Enzymes  Recent Labs Lab 06/03/2014 1040 05/29/14 0453  AST 44* 36  ALT 17 14*  ALKPHOS 137* 103  BILITOT 1.3* 1.2  ALBUMIN 3.1* 2.3*   Cardiac Enzymes No results for input(s): TROPONINI, PROBNP in the last 168 hours. Glucose  Recent Labs Lab 05/30/14 1132 05/30/14 1552 05/30/14 1923 05/30/14 2353 05/31/14 0329 05/31/14 0732  GLUCAP 134* 110* 138* 167* 137* 103*    Imaging 5/19 CXR images personally reviewed> ETT in place, low lung volumes, cardiac silhouette normal, no clear infiltrate   ASSESSMENT / PLAN:  PULMONARY A: Acute hypoxemic/hypercarbic respiratory failure > improving HCAP? > no clear infiltrate on my review P:   Full vent support SBT now> if more awake may  extubate  CARDIOVASCULAR CVL L IJ TLC 5/17>>> A:  Septic shock complicated by adrenal insufficiency > improving Mild to moderate MR 2014 echo P:  Continue to wean levophed for MAP > 65 Tele Continue hydrocortisone  RENAL A:   Acute renal failure, suspect volume depletion > improving  P:   KVO fluids MBET now Monitor BMET and UOP Replace electrolytes as needed  GASTROINTESTINAL A:   Abdominal pain, unknown cause > CT negative P:   Advance tube feedings Continue PPI  HEMATOLOGIC A:   Chronic thrombocytopenia> stable, petechial rash of uncertain etiology; worsened with sepsis, heparin held No clear bleeding  P:  HIT panel SCD for DVT prophylaxis Monitor for bleeding Daily CBC  INFECTIOUS A:   Source of infection unknown. PNA > not impressive on imaging Could he have tick borne illness > rash?  P:   BCx2 5/17>>> UC 5/17>>> Sputum 5/17>>> ABX: Cefepime 5/17>>> ABX: Vanc 5/17>>> 5/20 ABX: doxycycline 5/20 >>  Send RMSF, Ehrlichiosis serology ID consult Trend PCT  ENDOCRINE A:   Adrenal insufficiency due to a pituitary tumor   P:   Continue Stress dose steroids CBG monitoring and SSI  NEUROLOGIC A:   Acute metabolic encephalopathy 2nd to hypercarbia  P:   RASS goal -1 Fentanyl gtt PRN versed Monitor   FAMILY  - Updates: Family updated extensively about critical status and move to ICU on 5/18, no answer on call 5/20, left message   My cc time 40 minutes  Roselie Awkward, MD [facility] Pager: [phone] Cell: [phone] If no response, call [phone]

## 2014-05-31 NOTE — Consult Note (Signed)
[facility]  Date of Admission:  05/19/2014  Date of Consult:  05/31/2014  Reason for Consult: rash, sepsis Referring Physician: Lake Bells  Impression/Recommendation Rash Sepsis ARF Chronic pituitary insufficiency/chrnic steroids Ch  Chronic thrombocytopenia  Would agree with adding doxy Send serologies for spirochetes Vasculitis w/u Consider surgery for skin bx?  Comment- Broad ddx here- sepsis causing acute worsening of plt and giving him this rash. Certainly the steroid burst could do this as well.  I spoke with his family, he has no pets, no outdoor exposures, no gardening.   Agree with adding doxy  Thank you so much for this interesting consult,   Bobby Rumpf (pager) [phone] [email or web address]  Scott Mooney is an 81 y.o. male.  HPI: 81 yo M with pituitary insufficiency (on chronic steroids), CHF, CAD, chronic thrombocytopenia and L4 compression fracture with kyphoplasty on 5-10. He was d/c home on 5-12.  He was adm to [facility] on 5-17 with back pain, SOB, cough with yellow sputum, n/v, R sided abd pain and temp to 101.6. He did not have diarrhea. His lactic acid was 2.93 (minimal change with hydration), pro-calcitonin 4.63. Cr was elevated to 2.2. His PLT had decreased to 87k from 153k. He was started on vanco/cefepime, levophed as well as stress dose steroids. His CXR showed ? Of LLL infiltrate. He was found to be lethargic and required intbx on 5-18.  Over last 24h he has been noted to have petechial rash on BLE, abd.   Past Medical History  Diagnosis Date  . Diastolic dysfunction   . History of pituitary tumor   . Pituitary insufficiency   . Hypothyroidism   . GERD (gastroesophageal reflux disease)   . Splenic flexure syndrome   . CHF (congestive heart failure)     with diastolic dysfunction  . Hiatal hernia   . Coronary artery disease   . Cardiomyopathy   . Diverticulitis     recurrent  . Nephrolithiasis    right-sided  . Fatty liver   . Gout   . Arthritis   . Exogenous obesity   . Personal history of colonic polyps 07/23/2008    TUBULAR ADENOMA  . Ureteral stone     distal ureteral stone   . Thrombocytopenia   . Shortness of breath     Past Surgical History  Procedure Laterality Date  . Transphenoidal pituitary resection  2001  . Cholecystectomy, laparoscopic  2004  . Esophageal dilation  12/2006    endoscopy with dilatation of esophageal stricture     Allergies  Allergen Reactions  . Penicillins Swelling and Other (See Comments)    Head and feet swelling, spent 5 days in hospital as a result of taking med    Medications:  Scheduled: . antiseptic oral rinse  7 mL Mouth Rinse QID  . ceFEPime (MAXIPIME) IV  1 g Intravenous Q24H  . chlorhexidine  15 mL Mouth Rinse BID  . doxycycline  100 mg Oral Q12H  . feeding supplement (PRO-STAT SUGAR FREE 64)  60 mL Per Tube 5 X Daily  . feeding supplement (VITAL HIGH PROTEIN)  1,000 mL Per Tube Q24H  . fentaNYL (SUBLIMAZE) injection  50 mcg Intravenous Once  . hydrocortisone sod succinate (SOLU-CORTEF) inj  50 mg Intravenous Q8H  . insulin aspart  2-6 Units Subcutaneous 6 times per day  . levothyroxine  62.5 mcg Intravenous Q breakfast  . pantoprazole (PROTONIX) IV  40 mg Intravenous Q24H    Abtx:  Anti-infectives  Start     Dose/Rate Route Frequency Ordered Stop   05/31/14 1100  doxycycline (VIBRA-TABS) tablet 100 mg     100 mg Oral Every 12 hours 05/31/14 0953     05/29/14 1100  vancomycin (VANCOCIN) IVPB 1000 mg/200 mL premix  Status:  Discontinued     1,000 mg 200 mL/hr over 60 Minutes Intravenous Every 24 hours 05/27/2014 1419 05/31/14 0953   05/29/14 1000  ceFEPIme (MAXIPIME) 1 g in dextrose 5 % 50 mL IVPB     1 g 100 mL/hr over 30 Minutes Intravenous Every 24 hours 05/27/2014 1419     06/06/2014 1030  vancomycin (VANCOCIN) IVPB 1000 mg/200 mL premix  Status:  Discontinued     1,000 mg 200 mL/hr over 60 Minutes Intravenous   Once 05/23/2014 1019 06/11/2014 1028   06/10/2014 1030  ceFEPIme (MAXIPIME) 2 g in dextrose 5 % 50 mL IVPB     2 g 100 mL/hr over 30 Minutes Intravenous  Once 05/13/2014 1028 06/02/2014 1119   05/31/2014 1030  vancomycin (VANCOCIN) 1,500 mg in sodium chloride 0.9 % 500 mL IVPB     1,500 mg 250 mL/hr over 120 Minutes Intravenous  Once 05/13/2014 1028 06/02/2014 1256      Total days of antibiotics: Cefepime 5/17>>> Vanc 5/17>>> 5/20 doxycycline 5/20 >>          Social History:  reports that he quit smoking about 52 years ago. He has never used smokeless tobacco. He reports that he does not drink alcohol or use illicit drugs.  Family History  Problem Relation Age of Onset  . Colon cancer Brother   . Cancer Sister     ?  . Stroke Father   . Skin cancer Mother     General ROS: negative negative for - on vent . Marland Kitchen  Blood pressure 123/49, pulse 58, temperature 97.9 F (36.6 C), temperature source Oral, resp. rate 11, height _0  (1.626 m), weight 103.1 kg (227 lb 4.7 oz), SpO2 98 %. General appearance: no distress Eyes: negative findings: pupils equal, round, reactive to light and accomodation Throat: normal findings: oropharynx pink & moist without lesions or evidence of thrush and dry Neck: no adenopathy and supple, symmetrical, trachea midline Lungs: rhonchi anterior - bilateral Heart: regular rate and rhythm Abdomen: normal findings: bowel sounds normal and soft, non-tender Extremities: edema anasarca Skin: petechiae - scattered, LE and abd   Results for orders placed or performed during the hospital encounter of 05/22/2014 (from the past 48 hour(s))  Procalcitonin - Baseline     Status: None   Collection Time: 05/29/14 11:42 AM  Result Value Ref Range   Procalcitonin 8.48 ng/mL    Comment:        Interpretation: PCT > 2 ng/mL: Systemic infection (sepsis) is likely, unless other causes are known. (NOTE)         ICU PCT Algorithm               Non ICU PCT Algorithm     ----------------------------     ------------------------------         PCT < 0.25 ng/mL                 PCT < 0.1 ng/mL     Stopping of antibiotics            Stopping of antibiotics       strongly encouraged.               strongly encouraged.    ----------------------------     ------------------------------  PCT level decrease by               PCT < 0.25 ng/mL       >= 80% from peak PCT       OR PCT 0.25 - 0.5 ng/mL          Stopping of antibiotics                                             encouraged.     Stopping of antibiotics           encouraged.    ----------------------------     ------------------------------       PCT level decrease by              PCT >= 0.25 ng/mL       < 80% from peak PCT        AND PCT >= 0.5 ng/mL            Continuing antibiotics                                               encouraged.       Continuing antibiotics            encouraged.    ----------------------------     ------------------------------     PCT level increase compared          PCT > 0.5 ng/mL         with peak PCT AND          PCT >= 0.5 ng/mL             Escalation of antibiotics                                          strongly encouraged.      Escalation of antibiotics        strongly encouraged.   Glucose, capillary     Status: Abnormal   Collection Time: 05/29/14 11:47 AM  Result Value Ref Range   Glucose-Capillary 155 (H) 65 - 99 mg/dL  I-STAT 3, arterial blood gas (G3+)     Status: Abnormal   Collection Time: 05/29/14 12:51 PM  Result Value Ref Range   pH, Arterial 7.262 (L) 7.350 - 7.450   pCO2 arterial 34.5 (L) 35.0 - 45.0 mmHg   pO2, Arterial 87.0 80.0 - 100.0 mmHg   Bicarbonate 15.6 (L) 20.0 - 24.0 mEq/L   TCO2 17 0 - 100 mmol/L   O2 Saturation 95.0 %   Acid-base deficit 11.0 (H) 0.0 - 2.0 mmol/L   Patient temperature 98.1 F    Collection site RADIAL, ALLEN'S TEST ACCEPTABLE    Drawn by RT    Sample type ARTERIAL   Lactic acid, plasma     Status: None     Collection Time: 05/29/14  1:30 PM  Result Value Ref Range   Lactic Acid, Venous 1.9 0.5 - 2.0 mmol/L  Glucose, capillary     Status: Abnormal   Collection Time: 05/29/14  3:37 PM  Result Value Ref Range   Glucose-Capillary 130 (H) 65 - 99 mg/dL  Glucose, capillary     Status: Abnormal   Collection Time: 05/29/14  7:32 PM  Result Value Ref Range   Glucose-Capillary 147 (H) 65 - 99 mg/dL  Glucose, capillary     Status: Abnormal   Collection Time: 05/29/14 11:48 PM  Result Value Ref Range   Glucose-Capillary 120 (H) 65 - 99 mg/dL   Comment 1 Notify RN   Glucose, capillary     Status: Abnormal   Collection Time: 05/30/14  4:16 AM  Result Value Ref Range   Glucose-Capillary 125 (H) 65 - 99 mg/dL   Comment 1 Notify RN   Procalcitonin     Status: None   Collection Time: 05/30/14  4:40 AM  Result Value Ref Range   Procalcitonin 7.49 ng/mL    Comment:        Interpretation: PCT > 2 ng/mL: Systemic infection (sepsis) is likely, unless other causes are known. (NOTE)         ICU PCT Algorithm               Non ICU PCT Algorithm    ----------------------------     ------------------------------         PCT < 0.25 ng/mL                 PCT < 0.1 ng/mL     Stopping of antibiotics            Stopping of antibiotics       strongly encouraged.               strongly encouraged.    ----------------------------     ------------------------------       PCT level decrease by               PCT < 0.25 ng/mL       >= 80% from peak PCT       OR PCT 0.25 - 0.5 ng/mL          Stopping of antibiotics                                             encouraged.     Stopping of antibiotics           encouraged.    ----------------------------     ------------------------------       PCT level decrease by              PCT >= 0.25 ng/mL       < 80% from peak PCT        AND PCT >= 0.5 ng/mL            Continuing antibiotics                                               encouraged.       Continuing  antibiotics            encouraged.    ----------------------------     ------------------------------     PCT level increase compared          PCT > 0.5 ng/mL         with peak PCT AND          PCT >=  0.5 ng/mL             Escalation of antibiotics                                          strongly encouraged.      Escalation of antibiotics        strongly encouraged.   CBC     Status: Abnormal   Collection Time: 05/30/14  4:40 AM  Result Value Ref Range   WBC 10.4 4.0 - 10.5 K/uL   RBC 5.51 4.22 - 5.81 MIL/uL   Hemoglobin 17.0 13.0 - 17.0 g/dL   HCT 50.1 39.0 - 52.0 %   MCV 90.9 78.0 - 100.0 fL   MCH 30.9 26.0 - 34.0 pg   MCHC 33.9 30.0 - 36.0 g/dL   RDW 16.3 (H) 11.5 - 15.5 %   Platelets 46 (L) 150 - 400 K/uL    Comment: REPEATED TO VERIFY CONSISTENT WITH PREVIOUS RESULT   Basic metabolic panel     Status: Abnormal   Collection Time: 05/30/14  4:40 AM  Result Value Ref Range   Sodium 137 135 - 145 mmol/L   Potassium 4.5 3.5 - 5.1 mmol/L   Chloride 113 (H) 101 - 111 mmol/L   CO2 15 (L) 22 - 32 mmol/L   Glucose, Bld 134 (H) 65 - 99 mg/dL   BUN 32 (H) 6 - 20 mg/dL   Creatinine, Ser 1.99 (H) 0.61 - 1.24 mg/dL   Calcium 6.9 (L) 8.9 - 10.3 mg/dL   GFR calc non Af Amer 29 (L) >60 mL/min   GFR calc Af Amer 34 (L) >60 mL/min    Comment: (NOTE) The eGFR has been calculated using the CKD EPI equation. This calculation has not been validated in all clinical situations. eGFR's persistently <60 mL/min signify possible Chronic Kidney Disease.    Anion gap 9 5 - 15  Magnesium     Status: None   Collection Time: 05/30/14  4:40 AM  Result Value Ref Range   Magnesium 1.7 1.7 - 2.4 mg/dL  Phosphorus     Status: None   Collection Time: 05/30/14  4:40 AM  Result Value Ref Range   Phosphorus 3.8 2.5 - 4.6 mg/dL  Blood gas, arterial     Status: Abnormal   Collection Time: 05/30/14  4:40 AM  Result Value Ref Range   FIO2 0.40 %   Delivery systems VENTILATOR    Mode PRESSURE  REGULATED VOLUME CONTROL    VT 540.0 mL   Rate 18.0 resp/min   Peep/cpap 5.0 cm H20   pH, Arterial 7.284 (L) 7.350 - 7.450   pCO2 arterial 31.9 (L) 35.0 - 45.0 mmHg   pO2, Arterial 116 (H) 80.0 - 100.0 mmHg   Bicarbonate 14.6 (L) 20.0 - 24.0 mEq/L   TCO2 15.6 0 - 100 mmol/L   Acid-base deficit 10.7 (H) 0.0 - 2.0 mmol/L   O2 Saturation 98.0 %   Patient temperature 98.6    Collection site RIGHT RADIAL    Drawn by [id number]    Sample type ARTERIAL DRAW    Allens test (pass/fail) PASS PASS  Glucose, capillary     Status: Abnormal   Collection Time: 05/30/14  8:15 AM  Result Value Ref Range   Glucose-Capillary 118 (H) 65 - 99 mg/dL  Glucose, capillary     Status: Abnormal   Collection Time:  05/30/14 11:32 AM  Result Value Ref Range   Glucose-Capillary 134 (H) 65 - 99 mg/dL  Magnesium     Status: Abnormal   Collection Time: 05/30/14  3:25 PM  Result Value Ref Range   Magnesium 1.6 (L) 1.7 - 2.4 mg/dL  Phosphorus     Status: None   Collection Time: 05/30/14  3:25 PM  Result Value Ref Range   Phosphorus 3.0 2.5 - 4.6 mg/dL  Glucose, capillary     Status: Abnormal   Collection Time: 05/30/14  3:52 PM  Result Value Ref Range   Glucose-Capillary 110 (H) 65 - 99 mg/dL  Urinalysis, Routine w reflex microscopic     Status: Abnormal   Collection Time: 05/30/14  3:55 PM  Result Value Ref Range   Color, Urine AMBER (A) YELLOW    Comment: BIOCHEMICALS MAY BE AFFECTED BY COLOR   APPearance CLOUDY (A) CLEAR   Specific Gravity, Urine 1.013 1.005 - 1.030   pH 5.0 5.0 - 8.0   Glucose, UA NEGATIVE NEGATIVE mg/dL   Hgb urine dipstick LARGE (A) NEGATIVE   Bilirubin Urine NEGATIVE NEGATIVE   Ketones, ur NEGATIVE NEGATIVE mg/dL   Protein, ur 30 (A) NEGATIVE mg/dL   Urobilinogen, UA 0.2 0.0 - 1.0 mg/dL   Nitrite NEGATIVE NEGATIVE   Leukocytes, UA NEGATIVE NEGATIVE  Urine microscopic-add on     Status: Abnormal   Collection Time: 05/30/14  3:55 PM  Result Value Ref Range   WBC, UA 0-2 <3  WBC/hpf   RBC / HPF 21-50 <3 RBC/hpf   Casts GRANULAR CAST (A) NEGATIVE  Glucose, capillary     Status: Abnormal   Collection Time: 05/30/14  7:23 PM  Result Value Ref Range   Glucose-Capillary 138 (H) 65 - 99 mg/dL  Glucose, capillary     Status: Abnormal   Collection Time: 05/30/14 11:53 PM  Result Value Ref Range   Glucose-Capillary 167 (H) 65 - 99 mg/dL  Magnesium     Status: None   Collection Time: 05/31/14  1:38 AM  Result Value Ref Range   Magnesium 1.8 1.7 - 2.4 mg/dL  Phosphorus     Status: None   Collection Time: 05/31/14  1:38 AM  Result Value Ref Range   Phosphorus 3.0 2.5 - 4.6 mg/dL  Glucose, capillary     Status: Abnormal   Collection Time: 05/31/14  3:29 AM  Result Value Ref Range   Glucose-Capillary 137 (H) 65 - 99 mg/dL  Procalcitonin     Status: None   Collection Time: 05/31/14  3:30 AM  Result Value Ref Range   Procalcitonin 4.84 ng/mL    Comment:        Interpretation: PCT > 2 ng/mL: Systemic infection (sepsis) is likely, unless other causes are known. (NOTE)         ICU PCT Algorithm               Non ICU PCT Algorithm    ----------------------------     ------------------------------         PCT < 0.25 ng/mL                 PCT < 0.1 ng/mL     Stopping of antibiotics            Stopping of antibiotics       strongly encouraged.               strongly encouraged.    ----------------------------     ------------------------------  PCT level decrease by               PCT < 0.25 ng/mL       >= 80% from peak PCT       OR PCT 0.25 - 0.5 ng/mL          Stopping of antibiotics                                             encouraged.     Stopping of antibiotics           encouraged.    ----------------------------     ------------------------------       PCT level decrease by              PCT >= 0.25 ng/mL       < 80% from peak PCT        AND PCT >= 0.5 ng/mL            Continuing antibiotics                                                encouraged.       Continuing antibiotics            encouraged.    ----------------------------     ------------------------------     PCT level increase compared          PCT > 0.5 ng/mL         with peak PCT AND          PCT >= 0.5 ng/mL             Escalation of antibiotics                                          strongly encouraged.      Escalation of antibiotics        strongly encouraged.   Glucose, capillary     Status: Abnormal   Collection Time: 05/31/14  7:32 AM  Result Value Ref Range   Glucose-Capillary 103 (H) 65 - 99 mg/dL      Component Value Date/Time   SDES URINE, RANDOM 06/02/2014 2252   SPECREQUEST NONE 05/24/2014 2252   CULT NO GROWTH Performed at [facility]  05/12/2014 1515   REPTSTATUS 05/29/2014 FINAL 05/29/2014 2252   Dg Chest Port 1 View  05/30/2014   CLINICAL DATA:  Intubated patient, acute respiratory failure  EXAM: PORTABLE CHEST - 1 VIEW  COMPARISON:  Portable chest x-ray of May 29, 2014  FINDINGS: The lungs are reasonably well inflated. There is bibasilar atelectasis and small amounts of pleural fluid. The cardiac silhouette is enlarged but stable. The central pulmonary vascularity is prominent. There is subsegmental atelectasis in the right mid and lower lung and left mid lung.  The endotracheal tube tip lies 2.3 cm above the carina. The esophagogastric tube tip projects below the inferior margin of the image. The left internal jugular venous catheter tip projects over the distal third of the SVC.  IMPRESSION: 1. Fairly stable appearance of the chest since yesterday's study with bilateral atelectasis, small pleural  effusions, and mild central pulmonary vascular congestion. 2. The tip of the endotracheal tube today is 2.3 cm above the carina. Withdrawal by 2-3 cm help prevent accidental right mainstem bronchus intubation with patient movement.   Electronically Signed   By: David  Martinique M.D.   On: 05/30/2014 07:32   Dg Chest Port 1  View  05/29/2014   CLINICAL DATA:  Respiratory failure.  EXAM: PORTABLE CHEST - 1 VIEW  COMPARISON:  Film at 0453 hr  FINDINGS: Endotracheal tube present with the tip just barely above the carina. Left jugular central line tip is stable in the lower SVC. Nasogastric tube is been placed with the tip extending into the stomach. Lungs show persistent low volumes with stable left lower lobe atelectasis/consolidation, probable small left pleural effusion scarring/ atelectasis in the right lung. The heart size is normal. No pneumothorax.  IMPRESSION: Endotracheal tube tip is barely above the carina. Nasogastric tube extends into stomach. Stable left lower lobe atelectasis/consolidation with small left pleural effusion.   Electronically Signed   By: Aletta Edouard M.D.   On: 05/29/2014 12:33   Recent Results (from the past 240 hour(s))  Blood Culture (routine x 2)     Status: None (Preliminary result)   Collection Time: 05/22/2014 10:40 AM  Result Value Ref Range Status   Specimen Description BLOOD ARM LEFT  Final   Special Requests BOTTLES DRAWN AEROBIC AND ANAEROBIC 6CCBLUE 4CCRED  Final   Culture   Final           BLOOD CULTURE RECEIVED NO GROWTH TO DATE CULTURE WILL BE HELD FOR 5 DAYS BEFORE ISSUING A FINAL NEGATIVE REPORT Performed at [facility]    Report Status PENDING  Incomplete  Culture, blood (routine x 2)     Status: None (Preliminary result)   Collection Time: 06/04/2014 10:55 AM  Result Value Ref Range Status   Specimen Description BLOOD ARM RIGHT  Final   Special Requests BOTTLES DRAWN AEROBIC AND ANAEROBIC 5CC  Final   Culture   Final           BLOOD CULTURE RECEIVED NO GROWTH TO DATE CULTURE WILL BE HELD FOR 5 DAYS BEFORE ISSUING A FINAL NEGATIVE REPORT Performed at [facility]    Report Status PENDING  Incomplete  Urine culture     Status: None   Collection Time: 05/21/2014  3:15 PM  Result Value Ref Range Status   Specimen Description URINE, RANDOM  Final    Special Requests NONE  Final   Colony Count NO GROWTH Performed at [facility]   Final   Culture NO GROWTH Performed at [facility]   Final   Report Status 05/29/2014 FINAL  Final  MRSA PCR Screening     Status: None   Collection Time: 05/16/2014  5:27 PM  Result Value Ref Range Status   MRSA by PCR NEGATIVE NEGATIVE Final    Comment:        The GeneXpert MRSA Assay (FDA approved for NASAL specimens only), is one component of a comprehensive MRSA colonization surveillance program. It is not intended to diagnose MRSA infection nor to guide or monitor treatment for MRSA infections.       05/31/2014, 11:11 AM     LOS: 3 days

## 2014-05-31 NOTE — Progress Notes (Signed)
ANTIBIOTIC CONSULT NOTE  Pharmacy Consult for cefepime Indication: rule out sepsis  Allergies  Allergen Reactions  . Penicillins Swelling and Other (See Comments)    Head and feet swelling, spent 5 days in hospital as a result of taking med    Patient Measurements: Height: 5\' 4"  (162.6 cm) Weight: 227 lb 4.7 oz (103.1 kg) IBW/kg (Calculated) : 59.2 Adjusted Body Weight:   Vital Signs: Temp: 97.9 F (36.6 C) (05/20 0734) Temp Source: Oral (05/20 0734) BP: 123/49 mmHg (05/20 0900) Pulse Rate: 58 (05/20 0900) Intake/Output from previous day: 05/19 0701 - 05/20 0700 In: 3455.4 [I.V.:2651.4; NG/GT:304; IV Piggyback:500] Out: 1220 [Urine:1220] Intake/Output from this shift: Total I/O In: 232.1 [I.V.:172.1; NG/GT:60] Out: 100 [Urine:100]  Labs:  Recent Labs  05/27/2014 1040 05/29/14 0453 05/30/14 0440  WBC 4.3 6.1 10.4  HGB 17.5* 15.1 17.0  PLT 87* 51* 46*  CREATININE 2.20* 2.15* 1.99*   Estimated Creatinine Clearance: 30.6 mL/min (by C-G formula based on Cr of 1.99). No results for input(s): VANCOTROUGH, VANCOPEAK, VANCORANDOM, GENTTROUGH, GENTPEAK, GENTRANDOM, TOBRATROUGH, TOBRAPEAK, TOBRARND, AMIKACINPEAK, AMIKACINTROU, AMIKACIN in the last 72 hours.   Microbiology: Recent Results (from the past 720 hour(s))  Surgical PCR screen     Status: Abnormal   Collection Time: 05/21/14  7:11 AM  Result Value Ref Range Status   MRSA, PCR NEGATIVE NEGATIVE Final   Staphylococcus aureus POSITIVE (A) NEGATIVE Final    Comment:        The Xpert SA Assay (FDA approved for NASAL specimens in patients over 74 years of age), is one component of a comprehensive surveillance program.  Test performance has been validated by [facility] for patients greater than or equal to 10 year old. It is not intended to diagnose infection nor to guide or monitor treatment.   Blood Culture (routine x 2)     Status: None (Preliminary result)   Collection Time: 06/05/2014 10:40 AM  Result  Value Ref Range Status   Specimen Description BLOOD ARM LEFT  Final   Special Requests BOTTLES DRAWN AEROBIC AND ANAEROBIC 6CCBLUE 4CCRED  Final   Culture   Final           BLOOD CULTURE RECEIVED NO GROWTH TO DATE CULTURE WILL BE HELD FOR 5 DAYS BEFORE ISSUING A FINAL NEGATIVE REPORT Performed at [facility]    Report Status PENDING  Incomplete  Culture, blood (routine x 2)     Status: None (Preliminary result)   Collection Time: 06/05/2014 10:55 AM  Result Value Ref Range Status   Specimen Description BLOOD ARM RIGHT  Final   Special Requests BOTTLES DRAWN AEROBIC AND ANAEROBIC 5CC  Final   Culture   Final           BLOOD CULTURE RECEIVED NO GROWTH TO DATE CULTURE WILL BE HELD FOR 5 DAYS BEFORE ISSUING A FINAL NEGATIVE REPORT Performed at [facility]    Report Status PENDING  Incomplete  Urine culture     Status: None   Collection Time: 06/08/2014  3:15 PM  Result Value Ref Range Status   Specimen Description URINE, RANDOM  Final   Special Requests NONE  Final   Colony Count NO GROWTH Performed at [facility]   Final   Culture NO GROWTH Performed at [facility]   Final   Report Status 05/29/2014 FINAL  Final  MRSA PCR Screening     Status: None   Collection Time: 06/06/2014  5:27 PM  Result Value Ref  Range Status   MRSA by PCR NEGATIVE NEGATIVE Final    Comment:        The GeneXpert MRSA Assay (FDA approved for NASAL specimens only), is one component of a comprehensive MRSA colonization surveillance program. It is not intended to diagnose MRSA infection nor to guide or monitor treatment for MRSA infections.    Admitted with weakness, SOB, altered mental status, recent kyphoplasty. Started on antibiotics for r/o sepsis. Has developed a petechial rash on thighs and back. SCr improved to 1.99, est CrCl ~79mL/min.  WNL despite patient being on chronic steroids, afeb.  Vancomycin stopped today and switched to PO doxy.  Goal of Therapy:   Proper dosing based on renal and hepatic function  Plan:  -doxycycline 100mg  PO q12h per MD -Cefepime 1 g IV q24h -F/u Clinical progression, c/s, renal function, rash progression  Adiah Guereca D. Promise Weldin, PharmD, BCPS Clinical Pharmacist Pager: [phone] 05/31/2014 10:10 AM

## 2014-05-31 NOTE — Care Management Note (Signed)
Case Management Note  Patient Details  Name: Scott Mooney MRN: [id number] Date of Birth: 03-19-1929  Subjective/Objective:   05-31-14 Talked with son and wife in room.  Patient does live with wife at home.  Started asking wife about patient, but answers were very bizzare with nothing to do with what we were talking about.  Son states, Mom doesn't understand things and continued to answer questions, wife just stood by smiling and nodding head to what was being said. Son states they do life at home together, he lives close by and it sounds like the neighbors are very involved also.  POA is a cousin.  Nurse has gotten number for record.  CM will continue to follow.                  Now on vent.   Refused SNF last admission and was sent home with wifr and  HH RN, PT, OT and NA from [facility] .  May need higher level of care.  Sw consult placed.  CM will continue to follow.   Action/Plan:   Expected Discharge Date:                  Expected Discharge Plan:  Long Term Acute Care (LTAC)  In-House Referral:     Discharge planning Services     Post Acute Care Choice:    Choice offered to:     DME Arranged:    DME Agency:     HH Arranged:    HH Agency:     Status of Service:  In process, will continue to follow  Medicare Important Message Given:    Date Medicare IM Given:    Medicare IM give by:    Date Additional Medicare IM Given:    Additional Medicare Important Message give by:     If discussed at [place] of Stay Meetings, dates discussed:    Additional Comments:  Vergie Living, RN 05/31/2014, 1:18 PM

## 2014-05-31 NOTE — Procedures (Signed)
CVP line changed without complications.  

## 2014-05-31 NOTE — Clinical Social Work Note (Signed)
CSW acknowledged:  Clinical Education officer, museum received a consult indicating patient was admitted from a facility (SNF/ALF). CSW has reviewed chart and patient and pt's family both declined SNF placement during previous admission. CSW also spoke with pt's son, Deegan Valentino who also confirmed patient was admitted from home with wife and has never been a resident of a SNF.   Clinical Social Worker will sign off for now as social work intervention is no longer needed. Please consult [place] again if new need arises.  Glendon Axe, MSW, LCSWA [phone] 05/31/2014 12:21 PM

## 2014-06-01 ENCOUNTER — Inpatient Hospital Stay (HOSPITAL_COMMUNITY): Payer: PPO

## 2014-06-01 DIAGNOSIS — M4856XD Collapsed vertebra, not elsewhere classified, lumbar region, subsequent encounter for fracture with routine healing: Secondary | ICD-10-CM

## 2014-06-01 DIAGNOSIS — B9561 Methicillin susceptible Staphylococcus aureus infection as the cause of diseases classified elsewhere: Secondary | ICD-10-CM

## 2014-06-01 DIAGNOSIS — E230 Hypopituitarism: Secondary | ICD-10-CM

## 2014-06-01 DIAGNOSIS — I2510 Atherosclerotic heart disease of native coronary artery without angina pectoris: Secondary | ICD-10-CM

## 2014-06-01 DIAGNOSIS — Z9911 Dependence on respirator [ventilator] status: Secondary | ICD-10-CM

## 2014-06-01 DIAGNOSIS — R233 Spontaneous ecchymoses: Secondary | ICD-10-CM

## 2014-06-01 DIAGNOSIS — D696 Thrombocytopenia, unspecified: Secondary | ICD-10-CM | POA: Insufficient documentation

## 2014-06-01 DIAGNOSIS — Z9889 Other specified postprocedural states: Secondary | ICD-10-CM

## 2014-06-01 DIAGNOSIS — J189 Pneumonia, unspecified organism: Secondary | ICD-10-CM | POA: Insufficient documentation

## 2014-06-01 DIAGNOSIS — I509 Heart failure, unspecified: Secondary | ICD-10-CM

## 2014-06-01 DIAGNOSIS — Z7952 Long term (current) use of systemic steroids: Secondary | ICD-10-CM

## 2014-06-01 LAB — GLUCOSE, CAPILLARY
GLUCOSE-CAPILLARY: 120 mg/dL — AB (ref 65–99)
GLUCOSE-CAPILLARY: 101 mg/dL — AB (ref 65–99)
GLUCOSE-CAPILLARY: 90 mg/dL (ref 65–99)
Glucose-Capillary: 105 mg/dL — ABNORMAL HIGH (ref 65–99)
Glucose-Capillary: 107 mg/dL — ABNORMAL HIGH (ref 65–99)
Glucose-Capillary: 113 mg/dL — ABNORMAL HIGH (ref 65–99)

## 2014-06-01 LAB — CBC
HCT: 43 % (ref 39.0–52.0)
Hemoglobin: 14 g/dL (ref 13.0–17.0)
MCH: 30.1 pg (ref 26.0–34.0)
MCHC: 32.6 g/dL (ref 30.0–36.0)
MCV: 92.5 fL (ref 78.0–100.0)
PLATELETS: 19 10*3/uL — AB (ref 150–400)
RBC: 4.65 MIL/uL (ref 4.22–5.81)
RDW: 16.8 % — ABNORMAL HIGH (ref 11.5–15.5)
WBC: 3.3 10*3/uL — ABNORMAL LOW (ref 4.0–10.5)

## 2014-06-01 LAB — MAGNESIUM: Magnesium: 1.9 mg/dL (ref 1.7–2.4)

## 2014-06-01 LAB — PHOSPHORUS: Phosphorus: 2.8 mg/dL (ref 2.5–4.6)

## 2014-06-01 LAB — RPR: RPR Ser Ql: NONREACTIVE

## 2014-06-01 LAB — CLOSTRIDIUM DIFFICILE BY PCR: CDIFFPCR: NEGATIVE

## 2014-06-01 MED ORDER — LEVOTHYROXINE SODIUM 125 MCG PO TABS
125.0000 ug | ORAL_TABLET | Freq: Every day | ORAL | Status: DC
  Administered 2014-06-03 – 2014-06-06 (×4): 125 ug via ORAL
  Filled 2014-06-01 (×6): qty 1

## 2014-06-01 MED ORDER — DOXYCYCLINE HYCLATE 100 MG PO TABS
100.0000 mg | ORAL_TABLET | Freq: Two times a day (BID) | ORAL | Status: DC
  Administered 2014-06-01 – 2014-06-05 (×8): 100 mg
  Filled 2014-06-01 (×9): qty 1

## 2014-06-01 MED ORDER — FENTANYL CITRATE (PF) 100 MCG/2ML IJ SOLN
25.0000 ug | INTRAMUSCULAR | Status: DC | PRN
  Administered 2014-06-01 (×2): 50 ug via INTRAVENOUS
  Administered 2014-06-02 (×2): 100 ug via INTRAVENOUS
  Filled 2014-06-01 (×5): qty 2

## 2014-06-01 MED ORDER — MIDAZOLAM HCL 2 MG/2ML IJ SOLN: 1.0000 mg | INTRAMUSCULAR | Status: DC | PRN

## 2014-06-01 MED ORDER — PANTOPRAZOLE SODIUM 40 MG PO PACK
40.0000 mg | PACK | Freq: Every day | ORAL | Status: DC
  Administered 2014-06-02 – 2014-06-06 (×5): 40 mg
  Filled 2014-06-01 (×6): qty 20

## 2014-06-01 MED ORDER — FENTANYL CITRATE (PF) 100 MCG/2ML IJ SOLN: 50.0000 ug | INTRAMUSCULAR | Status: DC | PRN

## 2014-06-01 MED ORDER — HYDROCORTISONE NA SUCCINATE PF 100 MG IJ SOLR
50.0000 mg | Freq: Two times a day (BID) | INTRAMUSCULAR | Status: DC
  Administered 2014-06-01 – 2014-06-03 (×4): 50 mg via INTRAVENOUS
  Filled 2014-06-01 (×4): qty 1

## 2014-06-01 MED ORDER — LORAZEPAM 2 MG/ML IJ SOLN
0.5000 mg | INTRAMUSCULAR | Status: DC | PRN
  Administered 2014-06-01 – 2014-06-04 (×8): 1 mg via INTRAVENOUS
  Filled 2014-06-01 (×8): qty 1

## 2014-06-01 NOTE — Progress Notes (Signed)
Wasted 100 ml of fentanyl down sink with second RN, Joy.

## 2014-06-01 NOTE — Progress Notes (Signed)
C-Diff resulted negative. Enteric precautions discontinued.

## 2014-06-01 NOTE — Progress Notes (Signed)
PULMONARY / CRITICAL CARE MEDICINE   Name: Scott Mooney MRN: [id number] DOB: May 03, 1929    ADMISSION DATE:  06/11/2014 CONSULTATION DATE:  5/187/2015  REFERRING MD :  EDP  CHIEF COMPLAINT:  SOB  INITIAL PRESENTATION: 81 y/o male with recent admission for back pain during which he received kyphoplasty. He presented again to [facility] ED 5/17 complaining of SOB and back pain. He was hypotensive with elevated lactic which did not improve with IVF. CXR showed questionable LLL infiltrate. PCCM consulted for further evaluation  STUDIES:  5/17 CT abd/pel >> Significant splenomegaly, trace acites with possible cirrhosis, trace pleural effusions, few small hypo densities in liver. Scattered bilateral renal cysts.  SIGNIFICANT EVENTS: 5/8 - 5/12 admission for back pain, kyphoplasty via IR 5/10.  5/17   Admitted for shock of unclear etiology 5/20  Weaned on PSV, pressors weaned off   SUBJECTIVE:  Too sedate for SBT this am on fentanyl gtt, no distress.  Remains off levophed.  Diffuse petechial rash on LE's    VITAL SIGNS: Temp:  [97.4 F (36.3 C)-98.1 F (36.7 C)] 97.9 F (36.6 C) (05/21 0815) Pulse Rate:  [49-70] 58 (05/21 1030) Resp:  [6-20] 16 (05/21 1030) BP: (85-132)/(34-66) 115/63 mmHg (05/21 1030) SpO2:  [97 %-100 %] 100 % (05/21 1030) FiO2 (%):  [40 %] 40 % (05/21 0930) Weight:  [224 lb 3.3 oz (101.7 kg)] 224 lb 3.3 oz (101.7 kg) (05/21 0445)   HEMODYNAMICS: CVP:  [4 mmHg-11 mmHg] 11 mmHg   VENTILATOR SETTINGS: Vent Mode:  [-] CPAP;PSV FiO2 (%):  [40 %] 40 % Set Rate:  [18 bmp] 18 bmp Vt Set:  [540 mL] 540 mL PEEP:  [5 cmH20] 5 cmH20 Pressure Support:  [5 cmH20] 5 cmH20 Plateau Pressure:  [14 cmH20-18 cmH20] 18 cmH20   INTAKE / OUTPUT:  Intake/Output Summary (Last 24 hours) at 06/01/14 1117 Last data filed at 06/01/14 0900  Gross per 24 hour  Intake 929.27 ml  Output   1075 ml  Net -145.73 ml    PHYSICAL EXAMINATION: General:  Frail elderly male in NAD HENT: NCAT,  ETT in place PULM: CTA B CV RRR, no mgr GI: BS+, soft, nontender MSK: normal bulk and tone Derm: petchecial, non rasied non-tender diffuse rash bilateral thighs, non-blanching Neuro: sedate on vent, no response to verbal stimuli   LABS:  CBC  Recent Labs Lab 05/30/14 0440 05/31/14 1224 06/01/14 0500  WBC 10.4 4.9 3.3*  HGB 17.0 14.9 14.0  HCT 50.1 45.2 43.0  PLT 46* 19* 19*   Coag's  Recent Labs Lab 05/22/2014 1040 05/20/2014 1800  APTT  --  36  INR 1.26 1.49   BMET  Recent Labs Lab 05/30/14 0440 05/31/14 1224 05/31/14 1900  NA 137 141 143  K 4.5 4.0 4.0  CL 113* 119* 119*  CO2 15* 17* 18*  BUN 32* 35* 41*  CREATININE 1.99* 1.56* 1.56*  GLUCOSE 134* 148* 149*   Electrolytes  Recent Labs Lab 05/30/14 0440  05/31/14 0138 05/31/14 1224 05/31/14 1900 06/01/14 0400  CALCIUM 6.9*  --   --  7.7* 7.9*  --   MG 1.7  < > 1.8 1.8  --  1.9  PHOS 3.8  < > 3.0 2.8  --  2.8  < > = values in this interval not displayed. Sepsis Markers  Recent Labs Lab 05/18/2014 1510 06/09/2014 1738 05/29/14 1142 05/29/14 1330 05/30/14 0440 05/31/14 0330  LATICACIDVEN 3.8* 3.2*  --  1.9  --   --  PROCALCITON  --   --  8.48  --  7.49 4.84   ABG  Recent Labs Lab 05/14/2014 1241 05/29/14 1251 05/30/14 0440  PHART 7.386 7.262* 7.284*  PCO2ART 29.1* 34.5* 31.9*  PO2ART 78.0* 87.0 116*   Liver Enzymes  Recent Labs Lab 05/31/2014 1040 05/29/14 0453 05/31/14 1224  AST 44* 36 21  ALT 17 14* 13*  ALKPHOS 137* 103 80  BILITOT 1.3* 1.2 1.3*  ALBUMIN 3.1* 2.3* 2.0*   Cardiac Enzymes No results for input(s): TROPONINI, PROBNP in the last 168 hours.   Glucose  Recent Labs Lab 05/31/14 1150 05/31/14 1557 05/31/14 2058 05/31/14 2352 06/01/14 0353 06/01/14 0810  GLUCAP 132* 136* 109* 107* 120* 101*    Imaging 5/21 CXR >> LLL consolidation, R basilar atx  ASSESSMENT / PLAN:  PULMONARY A: Acute hypoxemic/hypercarbic respiratory failure -improving HCAP? -  no  clear infiltrate on CXR P:   MV support, change Vt to 500, rate 15 SBT daily with WUA  Family going to continue to discuss possibility of reintubation  Intermittent CXR   CARDIOVASCULAR CVL L IJ TLC 5/17 >> A:  Septic shock complicated by adrenal insufficiency - improving, off pressors 5/20 Mild to moderate MR 2014 echo P:  ICU monitoring  Continue hydrocortisone at stress dose   RENAL A:   Acute renal failure -  suspect volume depletion, improving P:   KVO fluids Monitor BMET and UOP Replace electrolytes as needed  GASTROINTESTINAL A:   Abdominal pain - unknown cause > CT negative P:   Advance tube feedings Continue PPI  HEMATOLOGIC A:   Chronic thrombocytopenia - stable, petechial rash of uncertain etiology; worsened with sepsis, heparin held No clear bleeding P:  HIT panel >> SCD for DVT prophylaxis Monitor for bleeding Daily CBC  INFECTIOUS A:   Source of infection unknown. PNA > not impressive on imaging.  Could he have tick borne illness with rash? P:   BCx2 5/17 >> UC 5/17 >> neg Sputum 5/17 >>  Cefepime 5/17>>> Vanc 5/17>>> 5/20 Doxycycline 5/20 >>  Send RMSF, Ehrlichiosis serology >> ID consult appreciated  Trend PCT  ENDOCRINE A:   Adrenal insufficiency due to a pituitary tumor P:   Continue stress dose steroids CBG monitoring and SSI  NEUROLOGIC A:   Acute metabolic encephalopathy 2nd to hypercarbia P:   RASS goal 0 to -1 D/C fentanyl gtt with oversedation, PRN fentanyl  PRN versed Monitor   FAMILY  - Updates: Niece updated extensively 5/21. She is POA.  Initially she stated she would want to "try again if he fails" in regards to extubation.  After discussion, she wants to consider it further.  He was very active prior to recent kyphoplasty with intact mental faculties.  Family is hopeful for return to baseline functional status.  Discussed prolonged critical illness and effects.  Will revisit with niece / family.      Noe Gens, NP-C [facility] Pgr: [phone] or [phone]   PCCM ATTENDING: I have reviewed pt's initial presentation, consultants notes and hospital database in detail.  The above assessment and plan was formulated under my direction.  In summary: Tolerating PSV Depressed LOC prohibits extubation CXR reveals LLL AS dz/atx and opacity in R medial lung base Cont to treat as PNA Lighten sedation Hopefully extubate 5/22 Will try to meet with family prior to extubation to clarify re-intubation status   30 minutes of independent CCM time was provided by me   Merton Border, MD;  PCCM service; Mobile [phone]

## 2014-06-01 NOTE — Progress Notes (Signed)
[place] for Infectious Disease       Subjective: Patient intubated on the ventilator   Antibiotics:  Anti-infectives    Start     Dose/Rate Route Frequency Ordered Stop   06/01/14 2200  doxycycline (VIBRA-TABS) tablet 100 mg     100 mg Per Tube Every 12 hours 06/01/14 1333     05/31/14 1100  doxycycline (VIBRA-TABS) tablet 100 mg  Status:  Discontinued     100 mg Oral Every 12 hours 05/31/14 0953 06/01/14 1333   05/29/14 1100  vancomycin (VANCOCIN) IVPB 1000 mg/200 mL premix  Status:  Discontinued     1,000 mg 200 mL/hr over 60 Minutes Intravenous Every 24 hours 05/27/2014 1419 05/31/14 0953   05/29/14 1000  ceFEPIme (MAXIPIME) 1 g in dextrose 5 % 50 mL IVPB     1 g 100 mL/hr over 30 Minutes Intravenous Every 24 hours 05/19/2014 1419     05/19/2014 1030  vancomycin (VANCOCIN) IVPB 1000 mg/200 mL premix  Status:  Discontinued     1,000 mg 200 mL/hr over 60 Minutes Intravenous  Once 05/29/2014 1019 05/29/2014 1028   05/21/2014 1030  ceFEPIme (MAXIPIME) 2 g in dextrose 5 % 50 mL IVPB     2 g 100 mL/hr over 30 Minutes Intravenous  Once 05/21/2014 1028 05/21/2014 1119   06/05/2014 1030  vancomycin (VANCOCIN) 1,500 mg in sodium chloride 0.9 % 500 mL IVPB     1,500 mg 250 mL/hr over 120 Minutes Intravenous  Once 05/30/2014 1028 05/16/2014 1256      Medications: Scheduled Meds: . antiseptic oral rinse  7 mL Mouth Rinse QID  . ceFEPime (MAXIPIME) IV  1 g Intravenous Q24H  . chlorhexidine  15 mL Mouth Rinse BID  . doxycycline  100 mg Per Tube Q12H  . feeding supplement (PRO-STAT SUGAR FREE 64)  60 mL Per Tube 5 X Daily  . feeding supplement (VITAL HIGH PROTEIN)  1,000 mL Per Tube Q24H  . hydrocortisone sod succinate (SOLU-CORTEF) inj  50 mg Intravenous Q12H  . insulin aspart  2-6 Units Subcutaneous 6 times per day  . [START ON 06/02/2014] levothyroxine  125 mcg Oral QAC breakfast  . pantoprazole sodium  40 mg Per Tube Q1200   Continuous Infusions: . sodium chloride 10 mL/hr at 06/01/14  0200   PRN Meds:.acetaminophen, bisacodyl, fentaNYL (SUBLIMAZE) injection, LORazepam    Objective: Weight change: -3 lb 1.4 oz (-1.4 kg)  Intake/Output Summary (Last 24 hours) at 06/01/14 1832 Last data filed at 06/01/14 1800  Gross per 24 hour  Intake    665 ml  Output   1385 ml  Net   -720 ml   Blood pressure 113/52, pulse 84, temperature 98.6 F (37 C), temperature source Oral, resp. rate 28, height 5\' 4"  (1.626 m), weight 224 lb 3.3 oz (101.7 kg), SpO2 99 %. Temp:  [97.4 F (36.3 C)-98.6 F (37 C)] 98.6 F (37 C) (05/21 1606) Pulse Rate:  [49-84] 84 (05/21 1800) Resp:  [6-28] 28 (05/21 1800) BP: (90-166)/(36-66) 113/52 mmHg (05/21 1800) SpO2:  [98 %-100 %] 99 % (05/21 1800) FiO2 (%):  [40 %] 40 % (05/21 1605) Weight:  [224 lb 3.3 oz (101.7 kg)] 224 lb 3.3 oz (101.7 kg) (05/21 0445)  Physical Exam: General: Intubated on the ventilator sedated HEENT: anicteric sclera,   CVS regular rate, normal r,  no murmur rubs or gallops Chest: Fairly clear to auscultation bilaterally, no wheezing, rales or rhonchi Abdomen: soft nondistended, normal bowel sounds,  Extremities:+ edema Skin:   Petechial rash throughout and prominent on legs and feet arms:  06/01/2014:         Neuro: nonfocal  CBC:    CBC Latest Ref Rng 06/01/2014 05/31/2014 05/30/2014  WBC 4.0 - 10.5 K/uL 3.3(L) 4.9 10.4  Hemoglobin 13.0 - 17.0 g/dL 14.0 14.9 17.0  Hematocrit 39.0 - 52.0 % 43.0 45.2 50.1  Platelets 150 - 400 K/uL 19(LL) 19(LL) 46(L)       BMET  Recent Labs  05/31/14 1224 05/31/14 1900  NA 141 143  K 4.0 4.0  CL 119* 119*  CO2 17* 18*  GLUCOSE 148* 149*  BUN 35* 41*  CREATININE 1.56* 1.56*  CALCIUM 7.7* 7.9*     Liver Panel   Recent Labs  05/31/14 1224  PROT 5.1*  ALBUMIN 2.0*  AST 21  ALT 13*  ALKPHOS 80  BILITOT 1.3*  BILIDIR 0.4  IBILI 0.9       Sedimentation Rate No results for input(s): ESRSEDRATE in the last 72 hours. C-Reactive Protein No  results for input(s): CRP in the last 72 hours.  Micro Results: Recent Results (from the past 720 hour(s))  Surgical PCR screen     Status: Abnormal   Collection Time: 05/21/14  7:11 AM  Result Value Ref Range Status   MRSA, PCR NEGATIVE NEGATIVE Final   Staphylococcus aureus POSITIVE (A) NEGATIVE Final    Comment:        The Xpert SA Assay (FDA approved for NASAL specimens in patients over 20 years of age), is one component of a comprehensive surveillance program.  Test performance has been validated by [facility] for patients greater than or equal to 66 year old. It is not intended to diagnose infection nor to guide or monitor treatment.   Blood Culture (routine x 2)     Status: None (Preliminary result)   Collection Time: 05/14/2014 10:40 AM  Result Value Ref Range Status   Specimen Description BLOOD ARM LEFT  Final   Special Requests BOTTLES DRAWN AEROBIC AND ANAEROBIC 6CCBLUE 4CCRED  Final   Culture   Final           BLOOD CULTURE RECEIVED NO GROWTH TO DATE CULTURE WILL BE HELD FOR 5 DAYS BEFORE ISSUING A FINAL NEGATIVE REPORT Performed at [facility]    Report Status PENDING  Incomplete  Culture, blood (routine x 2)     Status: None (Preliminary result)   Collection Time: 06/04/2014 10:55 AM  Result Value Ref Range Status   Specimen Description BLOOD ARM RIGHT  Final   Special Requests BOTTLES DRAWN AEROBIC AND ANAEROBIC 5CC  Final   Culture   Final           BLOOD CULTURE RECEIVED NO GROWTH TO DATE CULTURE WILL BE HELD FOR 5 DAYS BEFORE ISSUING A FINAL NEGATIVE REPORT Performed at [facility]    Report Status PENDING  Incomplete  Urine culture     Status: None   Collection Time: 06/10/2014  3:15 PM  Result Value Ref Range Status   Specimen Description URINE, RANDOM  Final   Special Requests NONE  Final   Colony Count NO GROWTH Performed at [facility]   Final   Culture NO GROWTH Performed at [facility]   Final   Report Status  05/29/2014 FINAL  Final  MRSA PCR Screening     Status: None   Collection Time: 05/12/2014  5:27 PM  Result Value Ref Range Status  MRSA by PCR NEGATIVE NEGATIVE Final    Comment:        The GeneXpert MRSA Assay (FDA approved for NASAL specimens only), is one component of a comprehensive MRSA colonization surveillance program. It is not intended to diagnose MRSA infection nor to guide or monitor treatment for MRSA infections.   Clostridium Difficile by PCR     Status: None   Collection Time: 06/01/14  5:31 AM  Result Value Ref Range Status   C difficile by pcr NEGATIVE NEGATIVE Final    Studies/Results: Dg Chest Port 1 View  06/01/2014   CLINICAL DATA:  Respiratory failure.  EXAM: PORTABLE CHEST - 1 VIEW  COMPARISON:  05/30/2014  FINDINGS: Endotracheal tube present with the tip approximately 1.5 cm above the carina. Nasogastric tube extends below the diaphragm. Lungs show relatively stable left lower lobe atelectasis/ consolidation. Atelectasis at the right lung base is stable. No evidence of pulmonary edema, pneumothorax or significant pleural fluid. The heart size is stable.  IMPRESSION: Stable left lower lobe consolidation and right basilar atelectasis.   Electronically Signed   By: Aletta Edouard M.D.   On: 06/01/2014 09:49      Assessment/Plan:  Principal Problem:   Sepsis Active Problems:   GERD (gastroesophageal reflux disease)   Essential hypertension   Chronic diastolic heart failure, NYHA class 2   Hypothyroidism   Adrenal insufficiency   Compression fracture of L4 lumbar vertebra- post Kyphoplasty   Acute renal failure superimposed on stage 3 chronic kidney disease   Arterial hypotension   Acute respiratory failure with hypoxemia   Septic shock    JERAY SHUGART is a 81 y.o. 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 He was d/c home on 5-12.  He was adm to  [facility] on 5-17 with back pain, SOB, cough with yellow sputum, n/v, R sided abd pain and temp to 101.6. He did not have diarrhea. His lactic acid was 2.93 (minimal change with hydration), pro-calcitonin 4.63. Cr was elevated to 2.2. His PLT had decreased to 87k from 153k. He was started on vanco/cefepime, levophed as well as stress dose steroids. His CXR showed ? Of LLL infiltrate. He was found to be lethargic and required intbx on 5-18. He had a CT of his abdomen and pelvis which showed some nonspecific hypodensities in the liver and his prior known compression fracture and kyphoplasty in the lumbar area. He then developed a petechial rash on BLE, abd. he is currently on cefepime doxycycline and likely all still still with vancomycin in his system.  #1 Sepsis of unclear cause: Agree with continuing tickborne coverage with doxycycline, cefepime for coverage of gram-negative send bactericidal coverage of strep species and sensitive staph species along Agree that a biopsy is of the skin might be potentially informative.          LOS: 4 days   Alcide Evener 06/01/2014, 6:32 PM

## 2014-06-02 ENCOUNTER — Inpatient Hospital Stay (HOSPITAL_COMMUNITY): Payer: PPO

## 2014-06-02 DIAGNOSIS — G9340 Encephalopathy, unspecified: Secondary | ICD-10-CM

## 2014-06-02 DIAGNOSIS — A770 Spotted fever due to Rickettsia rickettsii: Secondary | ICD-10-CM | POA: Insufficient documentation

## 2014-06-02 DIAGNOSIS — J9600 Acute respiratory failure, unspecified whether with hypoxia or hypercapnia: Secondary | ICD-10-CM | POA: Insufficient documentation

## 2014-06-02 DIAGNOSIS — R404 Transient alteration of awareness: Secondary | ICD-10-CM

## 2014-06-02 LAB — BASIC METABOLIC PANEL
Anion gap: 6 (ref 5–15)
BUN: 69 mg/dL — AB (ref 6–20)
CO2: 19 mmol/L — AB (ref 22–32)
Calcium: 8.5 mg/dL — ABNORMAL LOW (ref 8.9–10.3)
Chloride: 121 mmol/L — ABNORMAL HIGH (ref 101–111)
Creatinine, Ser: 1.53 mg/dL — ABNORMAL HIGH (ref 0.61–1.24)
GFR, EST AFRICAN AMERICAN: 47 mL/min — AB (ref >60)
GFR, EST NON AFRICAN AMERICAN: 40 mL/min — AB (ref >60)
GLUCOSE: 119 mg/dL — AB (ref 65–99)
Potassium: 3.5 mmol/L (ref 3.5–5.1)
Sodium: 146 mmol/L — ABNORMAL HIGH (ref 135–145)

## 2014-06-02 LAB — ANCA TITERS
Atypical P-ANCA titer: <1:20 {titer}
P-ANCA: <1:20 {titer}

## 2014-06-02 LAB — GLUCOSE, CAPILLARY
GLUCOSE-CAPILLARY: 104 mg/dL — AB (ref 65–99)
Glucose-Capillary: 114 mg/dL — ABNORMAL HIGH (ref 65–99)
Glucose-Capillary: 117 mg/dL — ABNORMAL HIGH (ref 65–99)
Glucose-Capillary: 122 mg/dL — ABNORMAL HIGH (ref 65–99)
Glucose-Capillary: 92 mg/dL (ref 65–99)

## 2014-06-02 MED ORDER — ONDANSETRON HCL 4 MG/2ML IJ SOLN
4.0000 mg | Freq: Once | INTRAMUSCULAR | Status: AC
  Administered 2014-06-02: 4 mg via INTRAVENOUS
  Filled 2014-06-02: qty 2

## 2014-06-02 MED ORDER — FENTANYL CITRATE (PF) 100 MCG/2ML IJ SOLN
25.0000 ug | INTRAMUSCULAR | Status: DC | PRN
  Administered 2014-06-02 – 2014-06-04 (×11): 50 ug via INTRAVENOUS
  Filled 2014-06-02 (×10): qty 2

## 2014-06-02 MED ORDER — FUROSEMIDE 10 MG/ML IJ SOLN
40.0000 mg | Freq: Once | INTRAMUSCULAR | Status: AC
  Administered 2014-06-02: 40 mg via INTRAVENOUS
  Filled 2014-06-02: qty 4

## 2014-06-02 MED ORDER — ONDANSETRON HCL 4 MG/2ML IJ SOLN
4.0000 mg | Freq: Four times a day (QID) | INTRAMUSCULAR | Status: DC | PRN
  Administered 2014-06-02: 4 mg via INTRAVENOUS
  Filled 2014-06-02: qty 2

## 2014-06-02 NOTE — Progress Notes (Signed)
Pt vomited large amount emesis.  Dr. Tamala Julian notified.  Orders received.  Will monitor pt.

## 2014-06-02 NOTE — Progress Notes (Signed)
eLink Physician-Brief Progress Note Patient Name: Scott Mooney DOB: Jul 27, 1929 MRN: [id number]   Date of Service  06/02/2014  HPI/Events of Note  Nurse notes patient to be hypertensive with significant edema.  eICU Interventions  Dose of lasix zofran for nausea     Intervention Category Intermediate Interventions: Hypertension - evaluation and management  Mauri Brooklyn, P 06/02/2014, 7:38 PM

## 2014-06-02 NOTE — Plan of Care (Signed)
Problem: Phase I Progression Outcomes Goal: OOB as tolerated unless otherwise ordered Outcome: Not Progressing Patient intubated

## 2014-06-02 NOTE — Progress Notes (Signed)
Pt vomited large amount yellow emesis when placed flat.  Dr. Tamala Julian notified and orders received.  Pt SBP-170s.  Dr. Tamala Julian notified and orders received.

## 2014-06-02 NOTE — Progress Notes (Signed)
[facility] Infectious Disease       Subjective: Patient intubated on the ventilator, unresponsive   Antibiotics:  Anti-infectives    Start     Dose/Rate Route Frequency Ordered Stop   06/01/14 2200  doxycycline (VIBRA-TABS) tablet 100 mg     100 mg Per Tube Every 12 hours 06/01/14 1333     05/31/14 1100  doxycycline (VIBRA-TABS) tablet 100 mg  Status:  Discontinued     100 mg Oral Every 12 hours 05/31/14 0953 06/01/14 1333   05/29/14 1100  vancomycin (VANCOCIN) IVPB 1000 mg/200 mL premix  Status:  Discontinued     1,000 mg 200 mL/hr over 60 Minutes Intravenous Every 24 hours 05/22/2014 1419 05/31/14 0953   05/29/14 1000  ceFEPIme (MAXIPIME) 1 g in dextrose 5 % 50 mL IVPB     1 g 100 mL/hr over 30 Minutes Intravenous Every 24 hours 06/04/2014 1419     06/04/2014 1030  vancomycin (VANCOCIN) IVPB 1000 mg/200 mL premix  Status:  Discontinued     1,000 mg 200 mL/hr over 60 Minutes Intravenous  Once 05/15/2014 1019 05/13/2014 1028   05/29/2014 1030  ceFEPIme (MAXIPIME) 2 g in dextrose 5 % 50 mL IVPB     2 g 100 mL/hr over 30 Minutes Intravenous  Once 05/14/2014 1028 05/31/2014 1119   06/02/2014 1030  vancomycin (VANCOCIN) 1,500 mg in sodium chloride 0.9 % 500 mL IVPB     1,500 mg 250 mL/hr over 120 Minutes Intravenous  Once 05/19/2014 1028 05/17/2014 1256      Medications: Scheduled Meds: . antiseptic oral rinse  7 mL Mouth Rinse QID  . ceFEPime (MAXIPIME) IV  1 g Intravenous Q24H  . chlorhexidine  15 mL Mouth Rinse BID  . doxycycline  100 mg Per Tube Q12H  . feeding supplement (PRO-STAT SUGAR FREE 64)  60 mL Per Tube 5 X Daily  . feeding supplement (VITAL HIGH PROTEIN)  1,000 mL Per Tube Q24H  . hydrocortisone sod succinate (SOLU-CORTEF) inj  50 mg Intravenous Q12H  . insulin aspart  2-6 Units Subcutaneous 6 times per day  . levothyroxine  125 mcg Oral QAC breakfast  . pantoprazole sodium  40 mg Per Tube Q1200   Continuous Infusions: . sodium chloride 10 mL/hr at 06/02/14 0444    PRN Meds:.acetaminophen, bisacodyl, fentaNYL (SUBLIMAZE) injection, LORazepam, ondansetron (ZOFRAN) IV    Objective: Weight change: -1 lb 8.7 oz (-0.7 kg)  Intake/Output Summary (Last 24 hours) at 06/02/14 2027 Last data filed at 06/02/14 1900  Gross per 24 hour  Intake    620 ml  Output   1725 ml  Net  -1105 ml   Blood pressure 162/57, pulse 78, temperature 98.5 F (36.9 C), temperature source Oral, resp. rate 20, height 5\' 4"  (1.626 m), weight 222 lb 10.6 oz (101 kg), SpO2 96 %. Temp:  [97.8 F (36.6 C)-98.5 F (36.9 C)] 98.5 F (36.9 C) (05/22 1946) Pulse Rate:  [56-84] 78 (05/22 1800) Resp:  [13-26] 20 (05/22 1800) BP: (91-174)/(32-83) 162/57 mmHg (05/22 1800) SpO2:  [96 %-100 %] 96 % (05/22 1800) FiO2 (%):  [40 %] 40 % (05/22 1915) Weight:  [222 lb 10.6 oz (101 kg)] 222 lb 10.6 oz (101 kg) (05/22 0422)  Physical Exam: General: Intubated on the ventilator nonresponsive HEENT: anicteric sclera,   CVS regular rate, normal r,  no murmur rubs or gallops Chest: Fairly clear to auscultation bilaterally, no wheezing, rales or rhonchi Abdomen: soft nondistended, normal bowel sounds,  Extremities:+ edema Skin:   Petechial rash throughout and prominent on legs and feet arms seems stable today compared to pictures taken yesterday  06/01/2014:         Neuro: nonfocal  CBC:    CBC Latest Ref Rng 06/02/2014 06/01/2014 05/31/2014  WBC 4.0 - 10.5 K/uL 3.2(L) 3.3(L) 4.9  Hemoglobin 13.0 - 17.0 g/dL 13.9 14.0 14.9  Hematocrit 39.0 - 52.0 % 42.9 43.0 45.2  Platelets 150 - 400 K/uL 14(LL) 19(LL) 19(LL)       BMET  Recent Labs  05/31/14 1900 06/02/14 0450  NA 143 146*  K 4.0 3.5  CL 119* 121*  CO2 18* 19*  GLUCOSE 149* 119*  BUN 41* 69*  CREATININE 1.56* 1.53*  CALCIUM 7.9* 8.5*     Liver Panel   Recent Labs  05/31/14 1224  PROT 5.1*  ALBUMIN 2.0*  AST 21  ALT 13*  ALKPHOS 80  BILITOT 1.3*  BILIDIR 0.4  IBILI 0.9       Sedimentation  Rate No results for input(s): ESRSEDRATE in the last 72 hours. C-Reactive Protein No results for input(s): CRP in the last 72 hours.  Micro Results: Recent Results (from the past 720 hour(s))  Surgical PCR screen     Status: Abnormal   Collection Time: 05/21/14  7:11 AM  Result Value Ref Range Status   MRSA, PCR NEGATIVE NEGATIVE Final   Staphylococcus aureus POSITIVE (A) NEGATIVE Final    Comment:        The Xpert SA Assay (FDA approved for NASAL specimens in patients over 19 years of age), is one component of a comprehensive surveillance program.  Test performance has been validated by [facility] for patients greater than or equal to 13 year old. It is not intended to diagnose infection nor to guide or monitor treatment.   Blood Culture (routine x 2)     Status: None (Preliminary result)   Collection Time: 06/08/2014 10:40 AM  Result Value Ref Range Status   Specimen Description BLOOD ARM LEFT  Final   Special Requests BOTTLES DRAWN AEROBIC AND ANAEROBIC 6CCBLUE 4CCRED  Final   Culture   Final           BLOOD CULTURE RECEIVED NO GROWTH TO DATE CULTURE WILL BE HELD FOR 5 DAYS BEFORE ISSUING A FINAL NEGATIVE REPORT Performed at [facility]    Report Status PENDING  Incomplete  Culture, blood (routine x 2)     Status: None (Preliminary result)   Collection Time: 05/23/2014 10:55 AM  Result Value Ref Range Status   Specimen Description BLOOD ARM RIGHT  Final   Special Requests BOTTLES DRAWN AEROBIC AND ANAEROBIC 5CC  Final   Culture   Final           BLOOD CULTURE RECEIVED NO GROWTH TO DATE CULTURE WILL BE HELD FOR 5 DAYS BEFORE ISSUING A FINAL NEGATIVE REPORT Performed at [facility]    Report Status PENDING  Incomplete  Urine culture     Status: None   Collection Time: 06/06/2014  3:15 PM  Result Value Ref Range Status   Specimen Description URINE, RANDOM  Final   Special Requests NONE  Final   Colony Count NO GROWTH Performed at [facility]    Final   Culture NO GROWTH Performed at [facility]   Final   Report Status 05/29/2014 FINAL  Final  MRSA PCR Screening     Status: None   Collection Time: 05/30/2014  5:27 PM  Result Value Ref Range Status   MRSA by PCR NEGATIVE NEGATIVE Final    Comment:        The GeneXpert MRSA Assay (FDA approved for NASAL specimens only), is one component of a comprehensive MRSA colonization surveillance program. It is not intended to diagnose MRSA infection nor to guide or monitor treatment for MRSA infections.   Clostridium Difficile by PCR     Status: None   Collection Time: 06/01/14  5:31 AM  Result Value Ref Range Status   C difficile by pcr NEGATIVE NEGATIVE Final    Studies/Results: Ct Head Wo Contrast  06/02/2014   CLINICAL DATA:  Patient with encephalopathy. History of pituitary tumor.  EXAM: CT HEAD WITHOUT CONTRAST  TECHNIQUE: Contiguous axial images were obtained from the base of the skull through the vertex without intravenous contrast.  COMPARISON:  Brain CT 07/13/2012; 05/18/2012  FINDINGS: Re- demonstrated calcified lesion/ postsurgical change within the sella. Ventricles and sulci are prominent compatible with atrophy. No evidence for acute cortically based infarct, intracranial hemorrhage, mass lesion or mass-effect. Orbits are unremarkable. Patient is intubated. Paranasal sinuses are unremarkable. Mastoid air cells are well aerated. Calvarium is intact.  IMPRESSION: No acute intracranial process.  Generalized parenchymal atrophy.   Electronically Signed   By: Lovey Newcomer M.D.   On: 06/02/2014 16:30   Dg Chest Port 1 View  06/02/2014   CLINICAL DATA:  Back pain and shortness of breath. Recent kyphoplasty.  EXAM: PORTABLE CHEST - 1 VIEW  COMPARISON:  Yesterday.  FINDINGS: Endotracheal tube in satisfactory position. Left jugular catheter tip in the upper right atrium. Nasogastric tube extending into the stomach. No significant change in dense left lower lobe opacity and mild  prominence of the pulmonary vasculature and interstitial markings. The cardiac silhouette remains mildly enlarged. Probable small right pleural effusion.  IMPRESSION: 1. Stable dense left lower lobe atelectasis or pneumonia. 2. Stable mild cardiomegaly, mild pulmonary vascular congestion and mild chronic interstitial lung disease. 3. Probable small right pleural effusion.   Electronically Signed   By: Claudie Revering M.D.   On: 06/02/2014 09:03   Dg Chest Port 1 View  06/01/2014   CLINICAL DATA:  Respiratory failure.  EXAM: PORTABLE CHEST - 1 VIEW  COMPARISON:  05/30/2014  FINDINGS: Endotracheal tube present with the tip approximately 1.5 cm above the carina. Nasogastric tube extends below the diaphragm. Lungs show relatively stable left lower lobe atelectasis/ consolidation. Atelectasis at the right lung base is stable. No evidence of pulmonary edema, pneumothorax or significant pleural fluid. The heart size is stable.  IMPRESSION: Stable left lower lobe consolidation and right basilar atelectasis.   Electronically Signed   By: Aletta Edouard M.D.   On: 06/01/2014 09:49      Assessment/Plan:  Principal Problem:   Sepsis Active Problems:   GERD (gastroesophageal reflux disease)   Essential hypertension   Chronic diastolic heart failure, NYHA class 2   Hypothyroidism   Adrenal insufficiency   Compression fracture of L4 lumbar vertebra- post Kyphoplasty   Acute renal failure superimposed on stage 3 chronic kidney disease   Arterial hypotension   Acute respiratory failure with hypoxemia   Septic shock   HCAP (healthcare-associated pneumonia)   Thrombocytopenia    Scott Mooney is a 81 y.o. 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 He was d/c home on 5-12.  He was adm to [facility] on 5-17 with back pain,  SOB, cough with yellow sputum, n/v, R sided abd pain and temp to 101.6. He did not have  diarrhea. His lactic acid was 2.93 (minimal change with hydration), pro-calcitonin 4.63. Cr was elevated to 2.2. His PLT had decreased to 87k from 153k. He was started on vanco/cefepime, levophed as well as stress dose steroids. His CXR showed ? Of LLL infiltrate. He was found to be lethargic and required intbx on 5-18. He had a CT of his abdomen and pelvis which showed some nonspecific hypodensities in the liver and his prior known compression fracture and kyphoplasty in the lumbar area. He then developed a petechial rash on BLE, abd. he is currently on cefepime doxycycline and likely all still still with vancomycin in his system.  #1 Sepsis of unclear cause: Agree with continuing tickborne coverage with doxycycline, cefepime for coverage of gram-negative send bactericidal coverage of strep species and sensitive staph species along Agree that a biopsy is of the skin might be potentially informative.  #2  AMS: Earlier today when I saw him he was minimally responsive. Firstly CT head does not show any intracranial bleed. Hopefully will recover with more time though prognosis does not seem especially good  #3 Thrombocytopenia: Undoubtedly from infection again possibly due to tickborne infection such as RMSF          LOS: 5 days   Scott Mooney 06/02/2014, 8:27 PM

## 2014-06-02 NOTE — Plan of Care (Signed)
Problem: Phase I Progression Outcomes Goal: Pain controlled with appropriate interventions Outcome: Progressing Patient receiving prn pain medication Goal: Hemodynamically stable Outcome: Progressing No vasopressors infusing

## 2014-06-02 NOTE — Progress Notes (Signed)
PULMONARY / CRITICAL CARE MEDICINE   Name: Scott Mooney MRN: [id number] DOB: [birth year]    ADMISSION DATE:  05/22/2014 CONSULTATION DATE:  5/187/2015  REFERRING MD :  EDP  CHIEF COMPLAINT:  SOB  INITIAL PRESENTATION: 81 y/o male with recent admission for back pain during which he received kyphoplasty. He presented again to [facility] ED 5/17 complaining of SOB and back pain. He was hypotensive with elevated lactic which did not improve with IVF. CXR showed questionable LLL infiltrate. PCCM consulted for further evaluation  STUDIES:  5/17 CT abd/pel >> Significant splenomegaly, trace acites with possible cirrhosis, trace pleural effusions, few small hypo densities in liver. Scattered bilateral renal cysts.  SIGNIFICANT EVENTS: 5/8 - 5/12 admission for back pain, kyphoplasty via IR 5/10.  5/17   Admitted for shock of unclear etiology 5/20  Weaned on PSV, pressors weaned off  5/21  Fentanyl gtt discontinued     SUBJECTIVE:  Sedation off, no waking / following commands this am.  RN reports family questioning process for extubation (not sure if they want to reintubate if extubated)  VITAL SIGNS: Temp:  [97.7 F (36.5 C)-98.6 F (37 C)] 98.3 F (36.8 C) (05/22 0753) Pulse Rate:  [50-84] 70 (05/22 0800) Resp:  [11-28] 17 (05/22 0800) BP: (101-166)/(32-66) 154/61 mmHg (05/22 0800) SpO2:  [96 %-100 %] 100 % (05/22 0800) FiO2 (%):  [40 %] 40 % (05/22 0739) Weight:  [222 lb 10.6 oz (101 kg)] 222 lb 10.6 oz (101 kg) (05/22 0422)   HEMODYNAMICS: CVP:  [8 mmHg] 8 mmHg   VENTILATOR SETTINGS: Vent Mode:  [-] CPAP;PSV FiO2 (%):  [40 %] 40 % Set Rate:  [15 bmp] 15 bmp Vt Set:  [500 mL] 500 mL PEEP:  [5 cmH20] 5 cmH20 Pressure Support:  [5 cmH20-8 cmH20] 8 cmH20   INTAKE / OUTPUT:  Intake/Output Summary (Last 24 hours) at 06/02/14 0850 Last data filed at 06/02/14 0600  Gross per 24 hour  Intake    600 ml  Output   1475 ml  Net   -875 ml    PHYSICAL EXAMINATION: General:  Frail  elderly male in NAD HENT: NCAT, ETT in place PULM: CTA B CV RRR, no mgr GI: BS+, soft, nontender MSK: normal bulk and tone Derm: petchecial, non rasied non-tender diffuse rash, non-blanching  Neuro: sedate on vent, raises upper extremities to verbal stimuli   LABS:  CBC  Recent Labs Lab 05/31/14 1224 06/01/14 0500 06/02/14 0450  WBC 4.9 3.3* 3.2*  HGB 14.9 14.0 13.9  HCT 45.2 43.0 42.9  PLT 19* 19* 14*   Coag's  Recent Labs Lab 06/11/2014 1040 05/21/2014 1800  APTT  --  36  INR 1.26 1.49   BMET  Recent Labs Lab 05/31/14 1224 05/31/14 1900 06/02/14 0450  NA 141 143 146*  K 4.0 4.0 3.5  CL 119* 119* 121*  CO2 17* 18* 19*  BUN 35* 41* 69*  CREATININE 1.56* 1.56* 1.53*  GLUCOSE 148* 149* 119*   Electrolytes  Recent Labs Lab 05/31/14 0138 05/31/14 1224 05/31/14 1900 06/01/14 0400 06/02/14 0450  CALCIUM  --  7.7* 7.9*  --  8.5*  MG 1.8 1.8  --  1.9  --   PHOS 3.0 2.8  --  2.8  --    Sepsis Markers  Recent Labs Lab 05/14/2014 1510 05/20/2014 1738 05/29/14 1142 05/29/14 1330 05/30/14 0440 05/31/14 0330  LATICACIDVEN 3.8* 3.2*  --  1.9  --   --   PROCALCITON  --   --  8.48  --  7.49 4.84   ABG  Recent Labs Lab 05/25/2014 1241 05/29/14 1251 05/30/14 0440  PHART 7.386 7.262* 7.284*  PCO2ART 29.1* 34.5* 31.9*  PO2ART 78.0* 87.0 116*   Liver Enzymes  Recent Labs Lab 06/06/2014 1040 05/29/14 0453 05/31/14 1224  AST 44* 36 21  ALT 17 14* 13*  ALKPHOS 137* 103 80  BILITOT 1.3* 1.2 1.3*  ALBUMIN 3.1* 2.3* 2.0*   Cardiac Enzymes No results for input(s): TROPONINI, PROBNP in the last 168 hours.   Glucose  Recent Labs Lab 06/01/14 1214 06/01/14 1608 06/01/14 1944 06/02/14 0002 06/02/14 0414 06/02/14 0750  GLUCAP 113* 105* 90 117* 114* 104*    Imaging 5/22 CXR >> LLL consolidation, ? Mild increase, R basilar atx  ASSESSMENT / PLAN:  PULMONARY A: Acute hypoxemic/hypercarbic respiratory failure -improving LLL HCAP  P:   MV  support, rest mode Vt 500, rate 15 SBT daily with WUA  Intermittent CXR  VAP prevention measures   CARDIOVASCULAR CVL L IJ TLC 5/17 >> A:  Septic shock complicated by adrenal insufficiency - improving, off pressors 5/20 Mild to moderate MR 2014 echo P:  ICU monitoring  Continue hydrocortisone at stress dose  Hold home lasix, ranexa   RENAL A:   Acute renal failure -  suspect volume depletion, improving P:   KVO fluids Monitor BMET and UOP Replace electrolytes as needed  GASTROINTESTINAL A:   Abdominal pain - unknown cause > CT negative P:   Continue TF as tolerated Continue PPI  HEMATOLOGIC A:   Chronic thrombocytopenia - stable, petechial rash of uncertain etiology; worsened with sepsis, heparin held No clear bleeding P:  HIT panel >> SCD for DVT prophylaxis Monitor for bleeding Daily CBC  INFECTIOUS A:   Source of infection unknown. PNA > not impressive on imaging.  Could he have tick borne illness with rash? P:   BCx2 5/17 >> UC 5/17 >> neg Sputum 5/17 >> C dff 5/21 >> NEG  Cefepime 5/17 >> Vanc 5/17 >> 5/20 Doxycycline 1/79 >>  RMSF, Ehrlichiosis serology >> ID consult appreciated  Trend PCT  ENDOCRINE A:   Adrenal insufficiency due to a pituitary tumor P:   Continue stress dose steroids CBG monitoring and SSI  NEUROLOGIC A:   Acute metabolic encephalopathy 2nd to hypercarbia P:   RASS goal 0 to -1 PRN fentanyl only Monitor neuro status  CT of head with worsening thrombocytopenia and neuro status to r/o bleed   FAMILY  - Updates: Niece updated extensively 5/21. She is POA. Family would not want to prolong him if he has no chance of recovery but still want to have him reintubated if he failed.     Noe Gens, NP-C [facility] Pgr: [phone] or [phone]  PCCM ATTENDING: I have reviewed pt's initial presentation, consultants notes and hospital database in detail.  The above assessment and plan was formulated  under my direction.  In summary: VDRF due to suspected HCAP in man with multiple baseline medical problems. Unfortunately, resp culture was never obtained. He presently does well on SBT but poor cognition prohibits extubation. He has been off continuous sedation infusions since yesterday morning but did receive 2 doses of 100 mcg fentanyl during the night. His platelet count has continued to drop. Will obtain CT head 5/22 to r/o CNS catastrophe. Reduce dose of PRN fentanyl. Cont daily WUA/SBT. It would be reasonable to begin goals of care discussions and consider limitations. Family updated in detail  40 minutes of independent CCM time was provided by me   Merton Border, MD;  PCCM service; [place] [phone]

## 2014-06-03 ENCOUNTER — Inpatient Hospital Stay (HOSPITAL_COMMUNITY): Payer: PPO

## 2014-06-03 ENCOUNTER — Other Ambulatory Visit (HOSPITAL_COMMUNITY): Payer: PPO

## 2014-06-03 DIAGNOSIS — N179 Acute kidney failure, unspecified: Secondary | ICD-10-CM | POA: Insufficient documentation

## 2014-06-03 DIAGNOSIS — J9600 Acute respiratory failure, unspecified whether with hypoxia or hypercapnia: Secondary | ICD-10-CM

## 2014-06-03 LAB — BASIC METABOLIC PANEL
ANION GAP: 7 (ref 5–15)
Anion gap: 6 (ref 5–15)
BUN: 75 mg/dL — ABNORMAL HIGH (ref 6–20)
BUN: 79 mg/dL — ABNORMAL HIGH (ref 6–20)
CALCIUM: 8.6 mg/dL — AB (ref 8.9–10.3)
CALCIUM: 8.4 mg/dL — AB (ref 8.9–10.3)
CO2: 21 mmol/L — AB (ref 22–32)
CO2: 21 mmol/L — ABNORMAL LOW (ref 22–32)
CREATININE: 1.37 mg/dL — AB (ref 0.61–1.24)
Chloride: 127 mmol/L — ABNORMAL HIGH (ref 101–111)
Chloride: 127 mmol/L — ABNORMAL HIGH (ref 101–111)
Creatinine, Ser: 1.39 mg/dL — ABNORMAL HIGH (ref 0.61–1.24)
GFR calc non Af Amer: 45 mL/min — ABNORMAL LOW (ref >60)
GFR, EST AFRICAN AMERICAN: 52 mL/min — AB (ref >60)
GFR, EST AFRICAN AMERICAN: 53 mL/min — AB (ref >60)
GFR, EST NON AFRICAN AMERICAN: 46 mL/min — AB (ref >60)
GLUCOSE: 127 mg/dL — AB (ref 65–99)
Glucose, Bld: 114 mg/dL — ABNORMAL HIGH (ref 65–99)
Potassium: 2.8 mmol/L — ABNORMAL LOW (ref 3.5–5.1)
Potassium: 3.5 mmol/L (ref 3.5–5.1)
SODIUM: 154 mmol/L — AB (ref 135–145)
Sodium: 155 mmol/L — ABNORMAL HIGH (ref 135–145)

## 2014-06-03 LAB — CULTURE, BLOOD (ROUTINE X 2)
CULTURE: NO GROWTH
Culture: NO GROWTH

## 2014-06-03 LAB — PHOSPHORUS: PHOSPHORUS: 2.6 mg/dL (ref 2.5–4.6)

## 2014-06-03 LAB — CBC
HCT: 42.9 % (ref 39.0–52.0)
HCT: 43 % (ref 39.0–52.0)
HEMOGLOBIN: 13.9 g/dL (ref 13.0–17.0)
HEMOGLOBIN: 14.1 g/dL (ref 13.0–17.0)
MCH: 29.9 pg (ref 26.0–34.0)
MCH: 30 pg (ref 26.0–34.0)
MCHC: 32.4 g/dL (ref 30.0–36.0)
MCHC: 32.8 g/dL (ref 30.0–36.0)
MCV: 92.3 fL (ref 78.0–100.0)
MCV: 91.5 fL (ref 78.0–100.0)
PLATELETS: 17 10*3/uL — AB (ref 150–400)
Platelets: 14 10*3/uL — CL (ref 150–400)
RBC: 4.65 MIL/uL (ref 4.22–5.81)
RBC: 4.7 MIL/uL (ref 4.22–5.81)
RDW: 16.9 % — AB (ref 11.5–15.5)
RDW: 16.9 % — ABNORMAL HIGH (ref 11.5–15.5)
WBC: 3.2 10*3/uL — ABNORMAL LOW (ref 4.0–10.5)
WBC: 3.9 10*3/uL — ABNORMAL LOW (ref 4.0–10.5)

## 2014-06-03 LAB — ROCKY MTN SPOTTED FVR ABS PNL(IGG+IGM)
RMSF IgG: NEGATIVE
RMSF IgM: 0.62 index (ref 0.00–0.89)

## 2014-06-03 LAB — GLUCOSE, CAPILLARY
Glucose-Capillary: 103 mg/dL — ABNORMAL HIGH (ref 65–99)
Glucose-Capillary: 111 mg/dL — ABNORMAL HIGH (ref 65–99)
Glucose-Capillary: 116 mg/dL — ABNORMAL HIGH (ref 65–99)
Glucose-Capillary: 133 mg/dL — ABNORMAL HIGH (ref 65–99)
Glucose-Capillary: 96 mg/dL (ref 65–99)
Glucose-Capillary: 97 mg/dL (ref 65–99)

## 2014-06-03 LAB — MAGNESIUM: Magnesium: 2 mg/dL (ref 1.7–2.4)

## 2014-06-03 MED ORDER — FUROSEMIDE 10 MG/ML IJ SOLN
20.0000 mg | Freq: Once | INTRAMUSCULAR | Status: AC
  Administered 2014-06-03: 20 mg via INTRAVENOUS
  Filled 2014-06-03: qty 2

## 2014-06-03 MED ORDER — METOCLOPRAMIDE HCL 5 MG/ML IJ SOLN
5.0000 mg | Freq: Two times a day (BID) | INTRAMUSCULAR | Status: DC
Start: 2014-06-03 — End: 2014-06-05
  Administered 2014-06-03 – 2014-06-04 (×4): 5 mg via INTRAVENOUS
  Filled 2014-06-03 (×6): qty 1

## 2014-06-03 MED ORDER — HYDROCORTISONE NA SUCCINATE PF 100 MG IJ SOLR
50.0000 mg | Freq: Every day | INTRAMUSCULAR | Status: DC
  Administered 2014-06-04: 50 mg via INTRAVENOUS
  Filled 2014-06-03: qty 1

## 2014-06-03 MED ORDER — GADOBENATE DIMEGLUMINE 529 MG/ML IV SOLN: 15.0000 mL | Freq: Once | INTRAVENOUS | Status: AC | PRN

## 2014-06-03 MED ORDER — POTASSIUM CHLORIDE 20 MEQ/15ML (10%) PO SOLN
40.0000 meq | Freq: Two times a day (BID) | ORAL | Status: AC
  Administered 2014-06-03 (×2): 40 meq
  Filled 2014-06-03 (×3): qty 30

## 2014-06-03 MED ORDER — VANCOMYCIN HCL IN DEXTROSE 750-5 MG/150ML-% IV SOLN
750.0000 mg | Freq: Two times a day (BID) | INTRAVENOUS | Status: DC
  Administered 2014-06-03 – 2014-06-05 (×4): 750 mg via INTRAVENOUS
  Filled 2014-06-03 (×5): qty 150

## 2014-06-03 MED ORDER — VANCOMYCIN HCL 10 G IV SOLR
1500.0000 mg | Freq: Once | INTRAVENOUS | Status: AC
  Administered 2014-06-03: 1500 mg via INTRAVENOUS
  Filled 2014-06-03: qty 1500

## 2014-06-03 MED ORDER — DEXTROSE 5 % IV SOLN
INTRAVENOUS | Status: DC
  Administered 2014-06-03 – 2014-06-06 (×5): via INTRAVENOUS

## 2014-06-03 MED ORDER — POTASSIUM CHLORIDE 10 MEQ/50ML IV SOLN
10.0000 meq | INTRAVENOUS | Status: AC
  Administered 2014-06-03 (×4): 10 meq via INTRAVENOUS
  Filled 2014-06-03 (×4): qty 50

## 2014-06-03 NOTE — Progress Notes (Signed)
PULMONARY / CRITICAL CARE MEDICINE   Name: Scott Mooney MRN: [id number] DOB: [birth year]    ADMISSION DATE:  05/23/2014 CONSULTATION DATE:  5/187/2015  REFERRING MD :  EDP  CHIEF COMPLAINT:  SOB  INITIAL PRESENTATION: 81 y/o male with recent admission for back pain during which he received kyphoplasty. He presented again to [facility] ED 5/17 complaining of SOB and back pain. He was hypotensive with elevated lactic which did not improve with IVF. CXR showed questionable LLL infiltrate. PCCM consulted for further evaluation  STUDIES:  5/17 CT abd/pel >> Significant splenomegaly, trace acites with possible cirrhosis, trace pleural effusions, few small hypo densities in liver. Scattered bilateral renal cysts.  SIGNIFICANT EVENTS: 5/8 - 5/12 admission for back pain, kyphoplasty via IR 5/10.  5/17   Admitted for shock of unclear etiology 5/20  Weaned on PSV, pressors weaned off  5/21  Fentanyl gtt discontinued  5/22 ct head. atrophy  SUBJECTIVE: not wake, no pressors  VITAL SIGNS: Temp:  [97.4 F (36.3 C)-98.5 F (36.9 C)] 97.4 F (36.3 C) (05/23 0804) Pulse Rate:  [56-96] 60 (05/23 0808) Resp:  [15-34] 21 (05/23 0808) BP: (91-189)/(41-91) 152/54 mmHg (05/23 0808) SpO2:  [96 %-100 %] 99 % (05/23 0808) FiO2 (%):  [40 %] 40 % (05/23 0808) Weight:  [99.1 kg (218 lb 7.6 oz)] 99.1 kg (218 lb 7.6 oz) (05/23 0500)   HEMODYNAMICS:     VENTILATOR SETTINGS: Vent Mode:  [-] PSV;CPAP FiO2 (%):  [40 %] 40 % Set Rate:  [15 bmp] 15 bmp Vt Set:  [500 mL] 500 mL PEEP:  [5 cmH20] 5 cmH20 Pressure Support:  [5 cmH20-8 cmH20] 5 cmH20 Plateau Pressure:  [10 cmH20] 10 cmH20   INTAKE / OUTPUT:  Intake/Output Summary (Last 24 hours) at 06/03/14 0831 Last data filed at 06/03/14 0800  Gross per 24 hour  Intake    995 ml  Output   3310 ml  Net  -2315 ml    PHYSICAL EXAMINATION: General:  Frail elderly male in NAD HENT: ETT in place PULM: Coarse bilateral CV RRR, no mgr GI: BS+, soft,  nontender MSK: normal bulk and tone Derm: petchecial, non rasied non-tender diffuse rash, non-blanching no changes in charachter, now diffuse Neuro: WD o pain all ext, not following commands   LABS:  CBC  Recent Labs Lab 06/01/14 0500 06/02/14 0450 06/03/14 0340  WBC 3.3* 3.2* 3.9*  HGB 14.0 13.9 14.1  HCT 43.0 42.9 43.0  PLT 19* 14* 17*   Coag's  Recent Labs Lab 05/18/2014 1040 06/11/2014 1800  APTT  --  36  INR 1.26 1.49   BMET  Recent Labs Lab 05/31/14 1900 06/02/14 0450 06/03/14 0340  NA 143 146* 154*  K 4.0 3.5 2.8*  CL 119* 121* 127*  CO2 18* 19* 21*  BUN 41* 69* 75*  CREATININE 1.56* 1.53* 1.39*  GLUCOSE 149* 119* 127*   Electrolytes  Recent Labs Lab 05/31/14 1224 05/31/14 1900 06/01/14 0400 06/02/14 0450 06/03/14 0340  CALCIUM 7.7* 7.9*  --  8.5* 8.6*  MG 1.8  --  1.9  --  2.0  PHOS 2.8  --  2.8  --  2.6   Sepsis Markers  Recent Labs Lab 05/23/2014 1510 05/29/2014 1738 05/29/14 1142 05/29/14 1330 05/30/14 0440 05/31/14 0330  LATICACIDVEN 3.8* 3.2*  --  1.9  --   --   PROCALCITON  --   --  8.48  --  7.49 4.84   ABG  Recent Labs Lab 05/27/2014 1241  05/29/14 1251 05/30/14 0440  PHART 7.386 7.262* 7.284*  PCO2ART 29.1* 34.5* 31.9*  PO2ART 78.0* 87.0 116*   Liver Enzymes  Recent Labs Lab 06/10/2014 1040 05/29/14 0453 05/31/14 1224  AST 44* 36 21  ALT 17 14* 13*  ALKPHOS 137* 103 80  BILITOT 1.3* 1.2 1.3*  ALBUMIN 3.1* 2.3* 2.0*   Cardiac Enzymes No results for input(s): TROPONINI, PROBNP in the last 168 hours.   Glucose  Recent Labs Lab 06/02/14 0414 06/02/14 0750 06/02/14 1209 06/02/14 1945 06/02/14 2357 06/03/14 0358  GLUCAP 114* 104* 122* 92 116* 111*    Imaging 5/23 CXR >> worsening bibasilar atx,. ett wnl  ASSESSMENT / PLAN:  PULMONARY A: Acute hypoxemic/hypercarbic respiratory failure -improving LLL HCAP  ATX P:   MV support, rest mode Vt 500, rate 15 SBT cpap 5 ps 5, goal 2 hr Neurostatus  precludes extubation Consider continued neg balance as tolerated  CARDIOVASCULAR CVL L IJ TLC 5/17 >> A:  Septic shock complicated by adrenal insufficiency - improving, off pressors 5/20 Mild to moderate MR 2014 echo See endocrine, adrenal insuff P:  ICU monitoring  Continue hydrocortisone at stress dose  Hold home lasix Obtain qtc  RENAL A:   Acute renal failure  Neg 2/3 liters last 24 hrs Hypernatremia hypokalemia P:   KVO fluids Monitor BMET in pm and am  Add free water d5w Reduce lasix k supp IV , add additional oral  GASTROINTESTINAL A:   Abdominal pain - unknown cause > CT negative Vomiting, ileus? P:   TF held Continue PPI kub reviewed, add reglan (last qtc wnl) low dose Use 10 cc/hr TF  HEMATOLOGIC A:   Chronic thrombocytopenia - stable, petechial rash of uncertain etiology (underlying liver dz? Consumption spleen, sepsis?); worsened with sepsis, heparin held No clear bleeding P:  HIT panel >> SCD for DVT prophylaxis Monitor for bleeding Daily CBC Transfuse if plat less 10 or bleeding  INFECTIOUS A:   Source of infection unknown. PNA > not impressive on imaging.  Could he have tick borne illness with rash? Vs petechia coalesce? P:   BCx2 5/17 >> UC 5/17 >> neg Sputum 5/17 >> C dff 5/21 >> NEG  Cefepime 5/17 >> Vanc 5/17 >> 5/20 Doxycycline 8/41 >>  RMSF, Ehrlichiosis serology >> ID consult following Trend PCT improved and no pressors Consider back MRI for epidural abscess - will d/w ID  ENDOCRINE A:   Adrenal insufficiency due to a pituitary tumor P:   Continue stress dose steroids, no pressors, reduce to daily CBG monitoring and SSI  NEUROLOGIC A:   Acute metabolic encephalopathy 2nd to hypercarbia Encephalopathy new this weekend ( ct head neg) P:   RASS goal 0 to -1 PRN fentanyl only Monitor neuro status  If MRI back, would add head eeg assessment, to r/o subclinical seizures    FAMILY  - Updates: Niece updated  extensively 5/21. She is POA. Family would not want to prolong him if he has no chance of recovery but still want to have him reintubated if he failed.     Ccm time 30 min  Lavon Paganini. Titus Mould, MD, [place] Pgr: [facility]

## 2014-06-03 NOTE — Progress Notes (Signed)
RT assisted with transporting PT from [facility] 2M08 to [facility] MRI- on 100% Fi02- uneventful. PT is now back in [facility] 2M08- RN at bedside.

## 2014-06-03 NOTE — Progress Notes (Signed)
ANTIBIOTIC CONSULT NOTE  Pharmacy Consult for cefepime and vancomycin (re-added) Indication: rule out sepsis  Allergies  Allergen Reactions  . Penicillins Swelling and Other (See Comments)    Head and feet swelling, spent 5 days in hospital as a result of taking med    Patient Measurements: Height: 5\' 4"  (162.6 cm) Weight: 218 lb 7.6 oz (99.1 kg) IBW/kg (Calculated) : 59.2 Vital Signs: Temp: 97.5 F (36.4 C) (05/23 1145) Temp Source: Axillary (05/23 1145) BP: 125/43 mmHg (05/23 1200) Pulse Rate: 64 (05/23 1200) Intake/Output from previous day: 05/22 0701 - 05/23 0700 In: 900 [I.V.:220; NG/GT:530; IV Piggyback:150] Out: 3280 [Urine:2780; Stool:500] Intake/Output from this shift: Total I/O In: 666.7 [I.V.:56.7; NG/GT:460; IV Piggyback:150] Out: 1255 [Urine:1005; Stool:250]  Labs:  Recent Labs  05/31/14 1900 06/01/14 0500 06/02/14 0450 06/03/14 0340  WBC  --  3.3* 3.2* 3.9*  HGB  --  14.0 13.9 14.1  PLT  --  19* 14* 17*  CREATININE 1.56*  --  1.53* 1.39*   Estimated Creatinine Clearance: 42.8 mL/min (by C-G formula based on Cr of 1.39). No results for input(s): VANCOTROUGH, VANCOPEAK, VANCORANDOM, GENTTROUGH, GENTPEAK, GENTRANDOM, TOBRATROUGH, TOBRAPEAK, TOBRARND, AMIKACINPEAK, AMIKACINTROU, AMIKACIN in the last 72 hours.   Microbiology: Recent Results (from the past 720 hour(s))  Surgical PCR screen     Status: Abnormal   Collection Time: 05/21/14  7:11 AM  Result Value Ref Range Status   MRSA, PCR NEGATIVE NEGATIVE Final   Staphylococcus aureus POSITIVE (A) NEGATIVE Final    Comment:        The Xpert SA Assay (FDA approved for NASAL specimens in patients over 58 years of age), is one component of a comprehensive surveillance program.  Test performance has been validated by [facility] for patients greater than or equal to 70 year old. It is not intended to diagnose infection nor to guide or monitor treatment.   Blood Culture (routine x 2)     Status:  None   Collection Time: 05/14/2014 10:40 AM  Result Value Ref Range Status   Specimen Description BLOOD ARM LEFT  Final   Special Requests BOTTLES DRAWN AEROBIC AND ANAEROBIC 6CCBLUE 4CCRED  Final   Culture   Final    NO GROWTH 5 DAYS Performed at [facility]    Report Status 06/03/2014 FINAL  Final  Culture, blood (routine x 2)     Status: None   Collection Time: 06/01/2014 10:55 AM  Result Value Ref Range Status   Specimen Description BLOOD ARM RIGHT  Final   Special Requests BOTTLES DRAWN AEROBIC AND ANAEROBIC 5CC  Final   Culture   Final    NO GROWTH 5 DAYS Performed at [facility]    Report Status 06/03/2014 FINAL  Final  Urine culture     Status: None   Collection Time: 06/03/2014  3:15 PM  Result Value Ref Range Status   Specimen Description URINE, RANDOM  Final   Special Requests NONE  Final   Colony Count NO GROWTH Performed at [facility]   Final   Culture NO GROWTH Performed at [facility]   Final   Report Status 05/29/2014 FINAL  Final  MRSA PCR Screening     Status: None   Collection Time: 06/08/2014  5:27 PM  Result Value Ref Range Status   MRSA by PCR NEGATIVE NEGATIVE Final    Comment:        The GeneXpert MRSA Assay (FDA approved for NASAL specimens only), is  one component of a comprehensive MRSA colonization surveillance program. It is not intended to diagnose MRSA infection nor to guide or monitor treatment for MRSA infections.   Clostridium Difficile by PCR     Status: None   Collection Time: 06/01/14  5:31 AM  Result Value Ref Range Status   C difficile by pcr NEGATIVE NEGATIVE Final   81 year old male admitted with weakness, SOB, altered mental status, recent kyphoplasty. abx D#7 for r/o sepsis + LLL PNA on CXR - afebrile, WBC low 3.9, PCT 4.63>7.49>4.84, LA 3.2 >1.9. SCr down 1.39 (range 1.08-1.64 since 2014). CrCL 40-74ml/min. UOP 0.6.  *Vancomycin was re-added 5/23 as requested per ID recommendations. MD is  aware of TTP association but would like to continue.   Has developed a petechial rash on thighs and back which is now all over thought to be 2/2 to thrombocytopenia.   Goal of Therapy:  Proper dosing based on renal and hepatic function Vancomycin 15-20   Plan:  -Restart Vancomycin with 1.5g load (as has been off since 5/19 and renal function has improved) then 750 mg IV every 12 hours.  -Continue Doxycycline 100mg  PO q12h per MD -Continue Cefepime 1 g IV q24h -F/u clinical progression, c/s, renal function, rash progression  Sloan Leiter, PharmD, BCPS Clinical Pharmacist [phone] 06/03/2014 2:41 PM

## 2014-06-03 NOTE — Progress Notes (Signed)
Pt unavail for EEG at this time. Going to MRI. Will try later

## 2014-06-03 NOTE — Progress Notes (Signed)
Wasted 1mg  ativan with Birder Robson RN.

## 2014-06-03 NOTE — Care Management Note (Signed)
Case Management Note  Patient Details  Name: Scott Mooney MRN: [id number] Date of Birth: Oct 28, 1929  Subjective/Objective:   06-03-14 Remains on vent - to alert enough for weaning.   05-31-14 Talked with son and wife in room.  Patient does live with wife at home.  Started asking wife about patient, but answers were very bizzare with nothing to do with what we were talking about.  Son states, Mom doesn't understand things and continued to answer questions, wife just stood by smiling and nodding head to what was being said. Son states they do life at home together, he lives close by and it sounds like the neighbors are very involved also.  POA is a cousin.  Nurse has gotten number for record.  CM will continue to follow.                  Now on vent.   Refused SNF last admission and was sent home with wifr and  HH RN, PT, OT and NA from [facility] .  May need higher level of care.  Sw consult placed.  CM will continue to follow.   Action/Plan:   Expected Discharge Date:                  Expected Discharge Plan:  Long Term Acute Care (LTAC)  In-House Referral:     Discharge planning Services     Post Acute Care Choice:    Choice offered to:     DME Arranged:    DME Agency:     HH Arranged:    HH Agency:     Status of Service:  In process, will continue to follow  Medicare Important Message Given:    Date Medicare IM Given:    Medicare IM give by:    Date Additional Medicare IM Given:    Additional Medicare Important Message give by:     If discussed at [place] of Stay Meetings, dates discussed:    Additional Comments:  Vergie Living, RN 06/03/2014, 9:33 AM

## 2014-06-03 NOTE — Progress Notes (Addendum)
INFECTIOUS DISEASE PROGRESS NOTE  ID: Scott Mooney is a 81 y.o. male with  Principal Problem:   Sepsis Active Problems:   GERD (gastroesophageal reflux disease)   Essential hypertension   Chronic diastolic heart failure, NYHA class 2   Hypothyroidism   Adrenal insufficiency   Compression fracture of L4 lumbar vertebra- post Kyphoplasty   Acute renal failure superimposed on stage 3 chronic kidney disease   Arterial hypotension   Acute respiratory failure with hypoxemia   Septic shock   HCAP (healthcare-associated pneumonia)   Thrombocytopenia   Acute respiratory failure   RMSF [facility] spotted fever)  Subjective: Per nsg, less responsive today. Off pressors, no sedation.   Abtx:  Anti-infectives    Start     Dose/Rate Route Frequency Ordered Stop   06/01/14 2200  doxycycline (VIBRA-TABS) tablet 100 mg     100 mg Per Tube Every 12 hours 06/01/14 1333     05/31/14 1100  doxycycline (VIBRA-TABS) tablet 100 mg  Status:  Discontinued     100 mg Oral Every 12 hours 05/31/14 0953 06/01/14 1333   05/29/14 1100  vancomycin (VANCOCIN) IVPB 1000 mg/200 mL premix  Status:  Discontinued     1,000 mg 200 mL/hr over 60 Minutes Intravenous Every 24 hours 05/31/2014 1419 05/31/14 0953   05/29/14 1000  ceFEPIme (MAXIPIME) 1 g in dextrose 5 % 50 mL IVPB     1 g 100 mL/hr over 30 Minutes Intravenous Every 24 hours 05/19/2014 1419     06/06/2014 1030  vancomycin (VANCOCIN) IVPB 1000 mg/200 mL premix  Status:  Discontinued     1,000 mg 200 mL/hr over 60 Minutes Intravenous  Once 05/16/2014 1019 06/02/2014 1028   05/14/2014 1030  ceFEPIme (MAXIPIME) 2 g in dextrose 5 % 50 mL IVPB     2 g 100 mL/hr over 30 Minutes Intravenous  Once 06/03/2014 1028 06/06/2014 1119   06/06/2014 1030  vancomycin (VANCOCIN) 1,500 mg in sodium chloride 0.9 % 500 mL IVPB     1,500 mg 250 mL/hr over 120 Minutes Intravenous  Once 05/25/2014 1028 05/18/2014 1256      Medications:  Scheduled: . antiseptic oral rinse  7 mL  Mouth Rinse QID  . ceFEPime (MAXIPIME) IV  1 g Intravenous Q24H  . chlorhexidine  15 mL Mouth Rinse BID  . doxycycline  100 mg Per Tube Q12H  . feeding supplement (PRO-STAT SUGAR FREE 64)  60 mL Per Tube 5 X Daily  . feeding supplement (VITAL HIGH PROTEIN)  1,000 mL Per Tube Q24H  . [START ON 06/04/2014] hydrocortisone sod succinate (SOLU-CORTEF) inj  50 mg Intravenous Daily  . insulin aspart  2-6 Units Subcutaneous 6 times per day  . levothyroxine  125 mcg Oral QAC breakfast  . metoCLOPramide (REGLAN) injection  5 mg Intravenous Q12H  . pantoprazole sodium  40 mg Per Tube Q1200  . potassium chloride  40 mEq Per Tube BID    Objective: Vital signs in last 24 hours: Temp:  [97.4 F (36.3 C)-98.5 F (36.9 C)] 97.4 F (36.3 C) (05/23 0804) Pulse Rate:  [60-96] 60 (05/23 0808) Resp:  [15-34] 21 (05/23 0808) BP: (91-189)/(41-91) 152/54 mmHg (05/23 0808) SpO2:  [96 %-100 %] 99 % (05/23 0808) FiO2 (%):  [40 %] 40 % (05/23 0808) Weight:  [99.1 kg (218 lb 7.6 oz)] 99.1 kg (218 lb 7.6 oz) (05/23 0500)   General appearance: mild distress Resp: rhonchi anterior - bilateral Cardio: regular rate and rhythm GI: normal  findings: soft, non-tender and abnormal findings:  hyperactive bowel sounds  LE rash is unchanged  Lab Results  Recent Labs  06/02/14 0450 06/03/14 0340  WBC 3.2* 3.9*  HGB 13.9 14.1  HCT 42.9 43.0  NA 146* 154*  K 3.5 2.8*  CL 121* 127*  CO2 19* 21*  BUN 69* 75*  CREATININE 1.53* 1.39*   Liver Panel  Recent Labs  05/31/14 1224  PROT 5.1*  ALBUMIN 2.0*  AST 21  ALT 13*  ALKPHOS 80  BILITOT 1.3*  BILIDIR 0.4  IBILI 0.9   Sedimentation Rate No results for input(s): ESRSEDRATE in the last 72 hours. C-Reactive Protein No results for input(s): CRP in the last 72 hours.  Microbiology: Recent Results (from the past 240 hour(s))  Blood Culture (routine x 2)     Status: None   Collection Time: 05/17/2014 10:40 AM  Result Value Ref Range Status   Specimen  Description BLOOD ARM LEFT  Final   Special Requests BOTTLES DRAWN AEROBIC AND ANAEROBIC 6CCBLUE 4CCRED  Final   Culture   Final    NO GROWTH 5 DAYS Performed at [facility]    Report Status 06/03/2014 FINAL  Final  Culture, blood (routine x 2)     Status: None   Collection Time: 06/01/2014 10:55 AM  Result Value Ref Range Status   Specimen Description BLOOD ARM RIGHT  Final   Special Requests BOTTLES DRAWN AEROBIC AND ANAEROBIC 5CC  Final   Culture   Final    NO GROWTH 5 DAYS Performed at [facility]    Report Status 06/03/2014 FINAL  Final  Urine culture     Status: None   Collection Time: 05/23/2014  3:15 PM  Result Value Ref Range Status   Specimen Description URINE, RANDOM  Final   Special Requests NONE  Final   Colony Count NO GROWTH Performed at [facility]   Final   Culture NO GROWTH Performed at [facility]   Final   Report Status 05/29/2014 FINAL  Final  MRSA PCR Screening     Status: None   Collection Time: 05/31/2014  5:27 PM  Result Value Ref Range Status   MRSA by PCR NEGATIVE NEGATIVE Final    Comment:        The GeneXpert MRSA Assay (FDA approved for NASAL specimens only), is one component of a comprehensive MRSA colonization surveillance program. It is not intended to diagnose MRSA infection nor to guide or monitor treatment for MRSA infections.   Clostridium Difficile by PCR     Status: None   Collection Time: 06/01/14  5:31 AM  Result Value Ref Range Status   C difficile by pcr NEGATIVE NEGATIVE Final    Studies/Results: Ct Head Wo Contrast  06/02/2014   CLINICAL DATA:  Patient with encephalopathy. History of pituitary tumor.  EXAM: CT HEAD WITHOUT CONTRAST  TECHNIQUE: Contiguous axial images were obtained from the base of the skull through the vertex without intravenous contrast.  COMPARISON:  Brain CT 07/13/2012; 05/18/2012  FINDINGS: Re- demonstrated calcified lesion/ postsurgical change within the sella.  Ventricles and sulci are prominent compatible with atrophy. No evidence for acute cortically based infarct, intracranial hemorrhage, mass lesion or mass-effect. Orbits are unremarkable. Patient is intubated. Paranasal sinuses are unremarkable. Mastoid air cells are well aerated. Calvarium is intact.  IMPRESSION: No acute intracranial process.  Generalized parenchymal atrophy.   Electronically Signed   By: Lovey Newcomer M.D.   On: 06/02/2014 16:30  Dg Chest Port 1 View  06/03/2014   CLINICAL DATA:  Respiratory failure  EXAM: PORTABLE CHEST - 1 VIEW  COMPARISON:  Portable chest x-ray of Jun 02, 2014  FINDINGS: Positioning of the patient today is reverse lordotic. The lungs remain borderline hypoinflated. There is persistent bibasilar atelectasis. Dense retrocardiac alveolar opacity has improved. Small amounts of pleural fluid remain bilaterally. The cardiac silhouette is mildly enlarged. The central pulmonary vascularity is engorged.  The endotracheal tube tip lies approximately 2 cm above the crotch of the carina. The esophagogastric tube tip projects below the inferior margin of the image. The left internal jugular venous catheter tip projects over the mid to distal portion of the SVC.  IMPRESSION: There has been some improvement in appearance of the pulmonary interstitium especially at the left lung base. A portion of this may be due to changes in positioning however. The support tubes and lines are in reasonable position.  Follow-up examination in a straight AP projection would be useful.   Electronically Signed   By: David  Martinique M.D.   On: 06/03/2014 07:32   Dg Chest Port 1 View  06/02/2014   CLINICAL DATA:  Back pain and shortness of breath. Recent kyphoplasty.  EXAM: PORTABLE CHEST - 1 VIEW  COMPARISON:  Yesterday.  FINDINGS: Endotracheal tube in satisfactory position. Left jugular catheter tip in the upper right atrium. Nasogastric tube extending into the stomach. No significant change in dense left  lower lobe opacity and mild prominence of the pulmonary vasculature and interstitial markings. The cardiac silhouette remains mildly enlarged. Probable small right pleural effusion.  IMPRESSION: 1. Stable dense left lower lobe atelectasis or pneumonia. 2. Stable mild cardiomegaly, mild pulmonary vascular congestion and mild chronic interstitial lung disease. 3. Probable small right pleural effusion.   Electronically Signed   By: Claudie Revering M.D.   On: 06/02/2014 09:03   Dg Abd Portable 1v  06/02/2014   CLINICAL DATA:  OG tube placement  EXAM: PORTABLE ABDOMEN - 1 VIEW  COMPARISON:  None.  FINDINGS: Enteric tube tip and side port projects over the expected location of the gastric antrum.  Paucity of bowel gas without definite evidence of obstruction.  Limited visualization of the lower thorax demonstrates mild elevation the right hemidiaphragm with suspected trace left-sided effusion.  Post cement augmentation of a lower lumbar vertebral body.  Post cholecystectomy.  IMPRESSION: Enteric tube tip and side port projects over the gastric antrum   Electronically Signed   By: Sandi Mariscal M.D.   On: 06/02/2014 23:34     Assessment/Plan: Rash Sepsis ARF Chronic pituitary insufficiency/chrnic steroids Chronic thrombocytopenia  Agree with MRI of his spine with recent kyphoplasty. Still no clear source for his sepsis.  His rash is most likely from his thrombocytopenia, which is worsened.  Await tick borne illness serologies.  His RPR and HIV are (-).    vanco can cause cytopenias as well.  Cr improving  Day 3 doxy, 7 vanco/cefepime       Bobby Rumpf Infectious Diseases (pager) [phone] [email or web address] 06/03/2014, 9:49 AM  LOS: 6 days

## 2014-06-03 NOTE — Progress Notes (Signed)
RT trnasport to [facility] [place]- was on FS.

## 2014-06-04 ENCOUNTER — Inpatient Hospital Stay (HOSPITAL_COMMUNITY): Payer: PPO

## 2014-06-04 DIAGNOSIS — N170 Acute kidney failure with tubular necrosis: Secondary | ICD-10-CM

## 2014-06-04 DIAGNOSIS — R4182 Altered mental status, unspecified: Secondary | ICD-10-CM

## 2014-06-04 DIAGNOSIS — E870 Hyperosmolality and hypernatremia: Secondary | ICD-10-CM

## 2014-06-04 LAB — CBC
HCT: 42.5 % (ref 39.0–52.0)
HEMOGLOBIN: 14 g/dL (ref 13.0–17.0)
MCH: 30.4 pg (ref 26.0–34.0)
MCHC: 32.9 g/dL (ref 30.0–36.0)
MCV: 92.4 fL (ref 78.0–100.0)
Platelets: 19 10*3/uL — CL (ref 150–400)
RBC: 4.6 MIL/uL (ref 4.22–5.81)
RDW: 17.5 % — ABNORMAL HIGH (ref 11.5–15.5)
WBC: 2.4 10*3/uL — ABNORMAL LOW (ref 4.0–10.5)

## 2014-06-04 LAB — BASIC METABOLIC PANEL
Anion gap: 6 (ref 5–15)
BUN: 76 mg/dL — AB (ref 6–20)
BUN: 92 mg/dL — ABNORMAL HIGH (ref 6–20)
CALCIUM: 8.6 mg/dL — AB (ref 8.9–10.3)
CHLORIDE: 128 mmol/L — AB (ref 101–111)
CO2: 20 mmol/L — AB (ref 22–32)
CO2: 18 mmol/L — ABNORMAL LOW (ref 22–32)
CREATININE: 1.33 mg/dL — AB (ref 0.61–1.24)
CREATININE: 2.16 mg/dL — AB (ref 0.61–1.24)
Calcium: 7.9 mg/dL — ABNORMAL LOW (ref 8.9–10.3)
Chloride: >130 mmol/L (ref 101–111)
GFR calc Af Amer: 55 mL/min — ABNORMAL LOW (ref >60)
GFR calc Af Amer: 31 mL/min — ABNORMAL LOW (ref >60)
GFR calc non Af Amer: 48 mL/min — ABNORMAL LOW (ref >60)
GFR calc non Af Amer: 27 mL/min — ABNORMAL LOW (ref >60)
Glucose, Bld: 78 mg/dL (ref 65–99)
Glucose, Bld: 151 mg/dL — ABNORMAL HIGH (ref 65–99)
Potassium: 3.1 mmol/L — ABNORMAL LOW (ref 3.5–5.1)
Potassium: 4.7 mmol/L (ref 3.5–5.1)
SODIUM: 156 mmol/L — AB (ref 135–145)
SODIUM: 152 mmol/L — AB (ref 135–145)

## 2014-06-04 LAB — GLUCOSE, CAPILLARY
GLUCOSE-CAPILLARY: 154 mg/dL — AB (ref 65–99)
Glucose-Capillary: 108 mg/dL — ABNORMAL HIGH (ref 65–99)
Glucose-Capillary: 113 mg/dL — ABNORMAL HIGH (ref 65–99)
Glucose-Capillary: 126 mg/dL — ABNORMAL HIGH (ref 65–99)
Glucose-Capillary: 127 mg/dL — ABNORMAL HIGH (ref 65–99)
Glucose-Capillary: 55 mg/dL — ABNORMAL LOW (ref 65–99)
Glucose-Capillary: 67 mg/dL (ref 65–99)
Glucose-Capillary: 77 mg/dL (ref 65–99)

## 2014-06-04 LAB — TSH: TSH: 0.015 u[IU]/mL — AB (ref 0.350–4.500)

## 2014-06-04 LAB — AMMONIA: Ammonia: 37 umol/L — ABNORMAL HIGH (ref 9–35)

## 2014-06-04 MED ORDER — SODIUM CHLORIDE 0.9 % IV SOLN
1.0000 g | Freq: Two times a day (BID) | INTRAVENOUS | Status: DC
  Filled 2014-06-04: qty 1

## 2014-06-04 MED ORDER — HYDROCORTISONE NA SUCCINATE PF 100 MG IJ SOLR
25.0000 mg | Freq: Every day | INTRAMUSCULAR | Status: DC
  Administered 2014-06-05 – 2014-06-06 (×2): 25 mg via INTRAVENOUS
  Filled 2014-06-04 (×2): qty 0.5

## 2014-06-04 MED ORDER — DEXTROSE 50 % IV SOLN
INTRAVENOUS | Status: AC
  Administered 2014-06-04: 50 mL
  Filled 2014-06-04: qty 50

## 2014-06-04 MED ORDER — MEROPENEM 1 G IV SOLR
2.0000 g | Freq: Two times a day (BID) | INTRAVENOUS | Status: DC
  Administered 2014-06-04 – 2014-06-05 (×2): 2 g via INTRAVENOUS
  Filled 2014-06-04 (×4): qty 2

## 2014-06-04 MED ORDER — FENTANYL BOLUS VIA INFUSION
25.0000 ug | INTRAVENOUS | Status: DC | PRN
  Administered 2014-06-05: 25 ug via INTRAVENOUS
  Filled 2014-06-04: qty 25

## 2014-06-04 MED ORDER — LACTULOSE 10 GM/15ML PO SOLN
30.0000 g | Freq: Two times a day (BID) | ORAL | Status: DC
  Administered 2014-06-04 – 2014-06-06 (×5): 30 g via ORAL
  Filled 2014-06-04 (×6): qty 45

## 2014-06-04 MED ORDER — SODIUM CHLORIDE 0.9 % IV SOLN
25.0000 ug/h | INTRAVENOUS | Status: DC
  Administered 2014-06-04: 50 ug/h via INTRAVENOUS
  Filled 2014-06-04: qty 50

## 2014-06-04 MED ORDER — POTASSIUM CHLORIDE 20 MEQ/15ML (10%) PO SOLN
40.0000 meq | ORAL | Status: AC
  Administered 2014-06-04 (×2): 40 meq via ORAL
  Filled 2014-06-04 (×2): qty 30

## 2014-06-04 MED ORDER — FENTANYL CITRATE (PF) 100 MCG/2ML IJ SOLN: 50.0000 ug | Freq: Once | INTRAMUSCULAR | Status: DC

## 2014-06-04 NOTE — Progress Notes (Addendum)
PULMONARY / CRITICAL CARE MEDICINE   Name: Scott Mooney MRN: [id number] DOB: March 05, 1929    ADMISSION DATE:  06/06/2014 CONSULTATION DATE:  5/187/2015  REFERRING MD :  EDP  CHIEF COMPLAINT:  SOB  INITIAL PRESENTATION: 81 y/o male with recent admission for back pain during which he received kyphoplasty. He presented again to [facility] ED 5/17 complaining of SOB and back pain. He was hypotensive with elevated lactic which did not improve with IVF. CXR showed questionable LLL infiltrate. PCCM consulted for further evaluation  STUDIES:  5/17 CT abd/pel >> Significant splenomegaly, trace acites with possible cirrhosis, trace pleural effusions, few small hypo densities in liver. Scattered bilateral renal cysts.  SIGNIFICANT EVENTS: 5/8 - 5/12 admission for back pain, kyphoplasty via IR 5/10.  5/17   Admitted for shock of unclear etiology 5/20  Weaned on PSV, pressors weaned off  5/21  Fentanyl gtt discontinued  5/22 ct head. Atrophy 5/23 mri brain / spine>>>no abscess clear, Partial opacification mastoid air cells and middle ear cavities without obstructing lesion of the eustachian tube noted  SUBJECTIVE: no change in neurostatus, MRI neg brain / spine  VITAL SIGNS: Temp:  [97.5 F (36.4 C)-98.1 F (36.7 C)] 98.1 F (36.7 C) (05/24 0802) Pulse Rate:  [50-105] 52 (05/24 1000) Resp:  [17-42] 42 (05/24 1000) BP: (113-170)/(32-74) 140/58 mmHg (05/24 1000) SpO2:  [98 %-100 %] 100 % (05/24 1000) FiO2 (%):  [40 %] 40 % (05/24 1000) Weight:  [100.6 kg (221 lb 12.5 oz)] 100.6 kg (221 lb 12.5 oz) (05/24 0500)   HEMODYNAMICS:     VENTILATOR SETTINGS: Vent Mode:  [-] PRVC FiO2 (%):  [40 %] 40 % Set Rate:  [14 bmp] 14 bmp Vt Set:  [500 mL] 500 mL PEEP:  [5 cmH20] 5 cmH20 Pressure Support:  [10 cmH20] 10 cmH20 Plateau Pressure:  [10 cmH20-15 cmH20] 15 cmH20   INTAKE / OUTPUT:  Intake/Output Summary (Last 24 hours) at 06/04/14 1026 Last data filed at 06/04/14 1000  Gross per 24 hour   Intake   1950 ml  Output   1960 ml  Net    -10 ml    PHYSICAL EXAMINATION: General:  Frail elderly male, elevated resp drive HENT: ETT in place PULM: Coarse bilateral unchanged, mild CV RRR, no m GI: BS+, soft, nontender MSK: normal bulk and tone Derm: petchecial, non rasied non-tender patching arms Neuro: not WD pain, not following commands   LABS:  CBC  Recent Labs Lab 06/02/14 0450 06/03/14 0340 06/04/14 0526  WBC 3.2* 3.9* 2.4*  HGB 13.9 14.1 14.0  HCT 42.9 43.0 42.5  PLT 14* 17* 19*   Coag's  Recent Labs Lab 06/06/2014 1040 06/09/2014 1800  APTT  --  36  INR 1.26 1.49   BMET  Recent Labs Lab 06/03/14 0340 06/03/14 1843 06/04/14 0526  NA 154* 155* 156*  K 2.8* 3.5 3.1*  CL 127* 127* >130*  CO2 21* 21* 20*  BUN 75* 79* 76*  CREATININE 1.39* 1.37* 1.33*  GLUCOSE 127* 114* 78   Electrolytes  Recent Labs Lab 05/31/14 1224  06/01/14 0400  06/03/14 0340 06/03/14 1843 06/04/14 0526  CALCIUM 7.7*  < >  --   < > 8.6* 8.4* 8.6*  MG 1.8  --  1.9  --  2.0  --   --   PHOS 2.8  --  2.8  --  2.6  --   --   < > = values in this interval not displayed. Sepsis Markers  Recent Labs Lab 05/13/2014 1510 06/01/2014 1738 05/29/14 1142 05/29/14 1330 05/30/14 0440 05/31/14 0330  LATICACIDVEN 3.8* 3.2*  --  1.9  --   --   PROCALCITON  --   --  8.48  --  7.49 4.84   ABG  Recent Labs Lab 06/11/2014 1241 05/29/14 1251 05/30/14 0440  PHART 7.386 7.262* 7.284*  PCO2ART 29.1* 34.5* 31.9*  PO2ART 78.0* 87.0 116*   Liver Enzymes  Recent Labs Lab 05/23/2014 1040 05/29/14 0453 05/31/14 1224  AST 44* 36 21  ALT 17 14* 13*  ALKPHOS 137* 103 80  BILITOT 1.3* 1.2 1.3*  ALBUMIN 3.1* 2.3* 2.0*   Cardiac Enzymes No results for input(s): TROPONINI, PROBNP in the last 168 hours.   Glucose  Recent Labs Lab 06/03/14 2014 06/03/14 2349 06/04/14 0345 06/04/14 0800 06/04/14 0806 06/04/14 0829  GLUCAP 103* 108* 77 55* 67 154*    Imaging 5/24 CXR >>  slight improved aeration, ett will out 1 cm  ASSESSMENT / PLAN:  PULMONARY A: Acute hypoxemic/hypercarbic respiratory failure -improving LLL HCAP  ATX P:   Min support SBT attempt ok, cpap 5 ps 5, goal 2 hr No extubation with neurostatus Lasix to neg balance, was even last 24 hrs Repeat abg in am   CARDIOVASCULAR CVL L IJ TLC 5/17 >> A:  Septic shock complicated by adrenal insufficiency - improving, off pressors 5/20 Mild to moderate MR 2014 echo See endocrine, adrenal insuff P:  ICU monitoring  lasix Back to hydrocortisone 25 mg IV daily  RENAL A:   Acute renal failure  Neg 2/3 liters last 24 hrs Hypernatremia hypokalemia P:   k supp Increase d5w to 100 cc/hr bmet in am and pm  Assess mg, phos also  GASTROINTESTINAL A:   Abdominal pain - unknown cause > CT negative vom resolved P:   TF held Continue PPI Improved with reglan, maintain Increase TF to 20 cc/hr  HEMATOLOGIC A:   Chronic thrombocytopenia - stable, petechial rash of uncertain etiology (underlying liver dz? Consumption spleen, sepsis?); worsened with sepsis, heparin held No clear bleeding P:  HIT panel >> SCD for DVT prophylaxis Monitor for bleeding Daily CBC Transfuse if plat less 10 or bleeding  INFECTIOUS A:   Source of infection unknown. PNA > not impressive on imaging.  Could he have tick borne illness with rash? Vs petechia coalesce? P:   BCx2 5/17 >> UC 5/17 >> neg Sputum 5/17 >> C dff 5/21 >> NEG  Cefepime 5/17 >> Vanc 5/17 >> 5/20>>>5/23>>> Doxycycline 5/20 >>  RMSF (neg), Ehrlichiosis serology >> ID consult following Mri neg abscess clearly Cant LP with plat Will discuss use BBB abx with ID  ENDOCRINE A:   Adrenal insufficiency due to a pituitary tumor P:   Reduce steroids to home dose equivalent Assess pit fxn as could relate top neurostatus - prolac, lh, fsh, tsh, acth CBG monitoring and SSI  NEUROLOGIC A:   Acute metabolic encephalopathy Encephalopathy  new this weekend ( ct head neg) - r/o metabolic, endocrine P:   RASS goal 0 Requires fent drip for rr Assess endocrine fxn, reoccurrence risk Ammonia level x1, consider empiric treatment , history fatty liver Correct Na eeg awaited Unable to LP with plat  FAMILY  - Updates: Niece updated extensively 5/21. She is POA. Family would not want to prolong him if he has no chance of recovery but still want to have him reintubated if he failed.     Ccm time 30 min  Lavon Paganini.  Titus Mould, MD, [place] Pgr: [facility]

## 2014-06-04 NOTE — Progress Notes (Signed)
RT note- ETT pulled back to 23cm

## 2014-06-04 NOTE — Progress Notes (Signed)
EEG Completed; Results Pending  

## 2014-06-04 NOTE — Progress Notes (Signed)
[place] for vancomycin and meropenem Indication: rule out sepsis  Allergies  Allergen Reactions  . Penicillins Swelling and Other (See Comments)    Head and feet swelling, spent 5 days in hospital as a result of taking med    Patient Measurements: Height: 5\' 4"  (162.6 cm) Weight: 221 lb 12.5 oz (100.6 kg) IBW/kg (Calculated) : 59.2 Vital Signs: Temp: 98.1 F (36.7 C) (05/24 0802) Temp Source: Oral (05/24 0802) BP: 140/58 mmHg (05/24 1000) Pulse Rate: 75 (05/24 1015) Intake/Output from previous day: 05/23 0701 - 05/24 0700 In: 2306.7 [I.V.:1306.7; NG/GT:700; IV Piggyback:300] Out: 2165 [Urine:1915; Stool:250] Intake/Output from this shift: Total I/O In: 400 [I.V.:170; NG/GT:30; IV Piggyback:200] Out: 160 [Urine:160]  Labs:  Recent Labs  06/02/14 0450 06/03/14 0340 06/03/14 1843 06/04/14 0526  WBC 3.2* 3.9*  --  2.4*  HGB 13.9 14.1  --  14.0  PLT 14* 17*  --  19*  CREATININE 1.53* 1.39* 1.37* 1.33*   Estimated Creatinine Clearance: 45.1 mL/min (by C-G formula based on Cr of 1.33). No results for input(s): VANCOTROUGH, VANCOPEAK, VANCORANDOM, GENTTROUGH, GENTPEAK, GENTRANDOM, TOBRATROUGH, TOBRAPEAK, TOBRARND, AMIKACINPEAK, AMIKACINTROU, AMIKACIN in the last 72 hours.   Microbiology: Recent Results (from the past 720 hour(s))  Surgical PCR screen     Status: Abnormal   Collection Time: 05/21/14  7:11 AM  Result Value Ref Range Status   MRSA, PCR NEGATIVE NEGATIVE Final   Staphylococcus aureus POSITIVE (A) NEGATIVE Final    Comment:        The Xpert SA Assay (FDA approved for NASAL specimens in patients over 37 years of age), is one component of a comprehensive surveillance program.  Test performance has been validated by [facility] for patients greater than or equal to 6 year old. It is not intended to diagnose infection nor to guide or monitor treatment.   Blood Culture (routine x 2)     Status: None   Collection  Time: 05/20/2014 10:40 AM  Result Value Ref Range Status   Specimen Description BLOOD ARM LEFT  Final   Special Requests BOTTLES DRAWN AEROBIC AND ANAEROBIC 6CCBLUE 4CCRED  Final   Culture   Final    NO GROWTH 5 DAYS Performed at [facility]    Report Status 06/03/2014 FINAL  Final  Culture, blood (routine x 2)     Status: None   Collection Time: 06/04/2014 10:55 AM  Result Value Ref Range Status   Specimen Description BLOOD ARM RIGHT  Final   Special Requests BOTTLES DRAWN AEROBIC AND ANAEROBIC 5CC  Final   Culture   Final    NO GROWTH 5 DAYS Performed at [facility]    Report Status 06/03/2014 FINAL  Final  Urine culture     Status: None   Collection Time: 05/22/2014  3:15 PM  Result Value Ref Range Status   Specimen Description URINE, RANDOM  Final   Special Requests NONE  Final   Colony Count NO GROWTH Performed at [facility]   Final   Culture NO GROWTH Performed at [facility]   Final   Report Status 05/29/2014 FINAL  Final  MRSA PCR Screening     Status: None   Collection Time: 06/11/2014  5:27 PM  Result Value Ref Range Status   MRSA by PCR NEGATIVE NEGATIVE Final    Comment:        The GeneXpert MRSA Assay (FDA approved for NASAL specimens only), is one component of a  comprehensive MRSA colonization surveillance program. It is not intended to diagnose MRSA infection nor to guide or monitor treatment for MRSA infections.   Clostridium Difficile by PCR     Status: None   Collection Time: 06/01/14  5:31 AM  Result Value Ref Range Status   C difficile by pcr NEGATIVE NEGATIVE Final   81 year old male admitted with weakness, SOB, altered mental status, recent kyphoplasty. abx D#8 for r/o sepsis + LLL PNA on CXR - afebrile, WBC low 2.4, PCT 4.63>7.49>4.84, LA 3.2 >1.9. SCr down 1.33 (range 1.08-1.64 since 2014). CrCL 40-68ml/min. UOP 0.6. Plts 19 (slight incr) -petechial rash.  *Vancomycin was re-added 5/23 as requested per ID  recommendations. MD is aware of TTP association but would like to continue. Concern for possible meningitis with recent back surgery but unable to do LP due to platelets. Dosing for meningitis.   Has developed a petechial rash on thighs and back which is now all over thought to be 2/2 to thrombocytopenia.   Vanc 5/17 >> 5/20; 5/23 >>  Cefepime 5/17 >> 5/24 Merrem 5/24 >> Doxy 5/20>>   5/21 CDiff: neg  5/20 [place] spotted fever panel: IgG neg, IgM neg  2/75 Ehrlichia antibody: IP  5/17 BCx x2: negative  5/17 UCx: negative Legionella/Strep UA negative  Goal of Therapy:  Proper dosing based on renal and hepatic function Vancomycin 15-20   Plan:  -Continue Vanc 750 mg IV every 12 hours.  -Continue Doxycycline 100mg  PO q12h per MD -Start Merrem 2g IV every 12 hours -F/u clinical progression, c/s, renal function, rash progression  Sloan Leiter, PharmD, BCPS Clinical Pharmacist [phone] 06/04/2014 11:25 AM

## 2014-06-04 NOTE — Progress Notes (Signed)
Hypoglycemic Event  CBG: 67  Treatment: D50 IV 50 mL  Symptoms: None  Follow-up CBG: [id number] CBG Result:154  Possible Reasons for Event: Unknown  Comments/MD notified:Katherine Eulas Post

## 2014-06-04 NOTE — Procedures (Signed)
I have had extensive discussions with family med poa. We discussed patients current circumstances and organ failures. We also discussed patient's prior wishes under circumstances such as this. Family has decided to NOT perform resuscitation if arrest but to continue current medical support for now. Stop Friday if not improved DNi upon planned extubation  Above await final answer  Lavon Paganini. Titus Mould, MD, [place] Pgr: [facility]

## 2014-06-04 NOTE — Progress Notes (Signed)
Dr. Titus Mould spoke with pt's family about code status and pt's wishes. After discussing with all family members, pt's family reported that they would like to make pt DNR. Verbal order from Dr. Titus Mould to change pt's code status to DNR.

## 2014-06-04 NOTE — Progress Notes (Addendum)
INFECTIOUS DISEASE PROGRESS NOTE  ID: Scott Mooney is a 81 y.o. male with  Principal Problem:   Sepsis Active Problems:   GERD (gastroesophageal reflux disease)   Essential hypertension   Chronic diastolic heart failure, NYHA class 2   Hypothyroidism   Adrenal insufficiency   Compression fracture of L4 lumbar vertebra- post Kyphoplasty   Acute renal failure superimposed on stage 3 chronic kidney disease   Arterial hypotension   Acute respiratory failure with hypoxemia   Septic shock   HCAP (healthcare-associated pneumonia)   Thrombocytopenia   Acute respiratory failure   RMSF [facility] spotted fever)   Acute renal failure syndrome  Subjective: On vent. No reaction to exam.   Abtx:  Anti-infectives    Start     Dose/Rate Route Frequency Ordered Stop   06/04/14 2000  meropenem (MERREM) 1 g in sodium chloride 0.9 % 100 mL IVPB     1 g 200 mL/hr over 30 Minutes Intravenous Every 12 hours 06/04/14 1109     06/03/14 2215  vancomycin (VANCOCIN) IVPB 750 mg/150 ml premix     750 mg 150 mL/hr over 60 Minutes Intravenous Every 12 hours 06/03/14 1011     06/03/14 1015  vancomycin (VANCOCIN) 1,500 mg in sodium chloride 0.9 % 500 mL IVPB     1,500 mg 250 mL/hr over 120 Minutes Intravenous  Once 06/03/14 1011 06/03/14 1404   06/01/14 2200  doxycycline (VIBRA-TABS) tablet 100 mg     100 mg Per Tube Every 12 hours 06/01/14 1333     05/31/14 1100  doxycycline (VIBRA-TABS) tablet 100 mg  Status:  Discontinued     100 mg Oral Every 12 hours 05/31/14 0953 06/01/14 1333   05/29/14 1100  vancomycin (VANCOCIN) IVPB 1000 mg/200 mL premix  Status:  Discontinued     1,000 mg 200 mL/hr over 60 Minutes Intravenous Every 24 hours 06/08/2014 1419 05/31/14 0953   05/29/14 1000  ceFEPIme (MAXIPIME) 1 g in dextrose 5 % 50 mL IVPB  Status:  Discontinued     1 g 100 mL/hr over 30 Minutes Intravenous Every 24 hours 06/03/2014 1419 06/04/14 1109   06/08/2014 1030  vancomycin (VANCOCIN) IVPB 1000  mg/200 mL premix  Status:  Discontinued     1,000 mg 200 mL/hr over 60 Minutes Intravenous  Once 06/03/2014 1019 05/16/2014 1028   05/15/2014 1030  ceFEPIme (MAXIPIME) 2 g in dextrose 5 % 50 mL IVPB     2 g 100 mL/hr over 30 Minutes Intravenous  Once 06/06/2014 1028 06/09/2014 1119   05/22/2014 1030  vancomycin (VANCOCIN) 1,500 mg in sodium chloride 0.9 % 500 mL IVPB     1,500 mg 250 mL/hr over 120 Minutes Intravenous  Once 05/24/2014 1028 05/25/2014 1256      Medications:  Scheduled: . antiseptic oral rinse  7 mL Mouth Rinse QID  . chlorhexidine  15 mL Mouth Rinse BID  . doxycycline  100 mg Per Tube Q12H  . feeding supplement (PRO-STAT SUGAR FREE 64)  60 mL Per Tube 5 X Daily  . feeding supplement (VITAL HIGH PROTEIN)  1,000 mL Per Tube Q24H  . fentaNYL (SUBLIMAZE) injection  50 mcg Intravenous Once  . [START ON 06/05/2014] hydrocortisone sod succinate (SOLU-CORTEF) inj  25 mg Intravenous Daily  . insulin aspart  2-6 Units Subcutaneous 6 times per day  . lactulose  30 g Oral BID  . levothyroxine  125 mcg Oral QAC breakfast  . meropenem (MERREM) IV  1 g Intravenous  Q12H  . metoCLOPramide (REGLAN) injection  5 mg Intravenous Q12H  . pantoprazole sodium  40 mg Per Tube Q1200  . potassium chloride  40 mEq Oral Q4H  . vancomycin  750 mg Intravenous Q12H    Objective: Vital signs in last 24 hours: Temp:  [97.5 F (36.4 C)-98.1 F (36.7 C)] 98.1 F (36.7 C) (05/24 0802) Pulse Rate:  [50-105] 75 (05/24 1015) Resp:  [17-42] 36 (05/24 1015) BP: (113-170)/(32-74) 140/58 mmHg (05/24 1000) SpO2:  [95 %-100 %] 95 % (05/24 1015) FiO2 (%):  [40 %] 40 % (05/24 1000) Weight:  [100.6 kg (221 lb 12.5 oz)] 100.6 kg (221 lb 12.5 oz) (05/24 0500)   General appearance: no distress Eyes: pupils = Resp: rhonchi anterior - bilateral Cardio: regular rate and rhythm GI: abnormal findings:  distended and hypoactive bowel sounds Skin: petechiae - generalized  Lab Results  Recent Labs  06/03/14 0340  06/03/14 1843 06/04/14 0526  WBC 3.9*  --  2.4*  HGB 14.1  --  14.0  HCT 43.0  --  42.5  NA 154* 155* 156*  K 2.8* 3.5 3.1*  CL 127* 127* >130*  CO2 21* 21* 20*  BUN 75* 79* 76*  CREATININE 1.39* 1.37* 1.33*   Liver Panel No results for input(s): PROT, ALBUMIN, AST, ALT, ALKPHOS, BILITOT, BILIDIR, IBILI in the last 72 hours. Sedimentation Rate No results for input(s): ESRSEDRATE in the last 72 hours. C-Reactive Protein No results for input(s): CRP in the last 72 hours.  Microbiology: Recent Results (from the past 240 hour(s))  Blood Culture (routine x 2)     Status: None   Collection Time: 06/09/2014 10:40 AM  Result Value Ref Range Status   Specimen Description BLOOD ARM LEFT  Final   Special Requests BOTTLES DRAWN AEROBIC AND ANAEROBIC 6CCBLUE 4CCRED  Final   Culture   Final    NO GROWTH 5 DAYS Performed at [facility]    Report Status 06/03/2014 FINAL  Final  Culture, blood (routine x 2)     Status: None   Collection Time: 05/20/2014 10:55 AM  Result Value Ref Range Status   Specimen Description BLOOD ARM RIGHT  Final   Special Requests BOTTLES DRAWN AEROBIC AND ANAEROBIC 5CC  Final   Culture   Final    NO GROWTH 5 DAYS Performed at [facility]    Report Status 06/03/2014 FINAL  Final  Urine culture     Status: None   Collection Time: 05/20/2014  3:15 PM  Result Value Ref Range Status   Specimen Description URINE, RANDOM  Final   Special Requests NONE  Final   Colony Count NO GROWTH Performed at [facility]   Final   Culture NO GROWTH Performed at [facility]   Final   Report Status 05/29/2014 FINAL  Final  MRSA PCR Screening     Status: None   Collection Time: 05/18/2014  5:27 PM  Result Value Ref Range Status   MRSA by PCR NEGATIVE NEGATIVE Final    Comment:        The GeneXpert MRSA Assay (FDA approved for NASAL specimens only), is one component of a comprehensive MRSA colonization surveillance program. It is  not intended to diagnose MRSA infection nor to guide or monitor treatment for MRSA infections.   Clostridium Difficile by PCR     Status: None   Collection Time: 06/01/14  5:31 AM  Result Value Ref Range Status   C difficile by pcr  NEGATIVE NEGATIVE Final    Studies/Results: Ct Head Wo Contrast  06/02/2014   CLINICAL DATA:  Patient with encephalopathy. History of pituitary tumor.  EXAM: CT HEAD WITHOUT CONTRAST  TECHNIQUE: Contiguous axial images were obtained from the base of the skull through the vertex without intravenous contrast.  COMPARISON:  Brain CT 07/13/2012; 05/18/2012  FINDINGS: Re- demonstrated calcified lesion/ postsurgical change within the sella. Ventricles and sulci are prominent compatible with atrophy. No evidence for acute cortically based infarct, intracranial hemorrhage, mass lesion or mass-effect. Orbits are unremarkable. Patient is intubated. Paranasal sinuses are unremarkable. Mastoid air cells are well aerated. Calvarium is intact.  IMPRESSION: No acute intracranial process.  Generalized parenchymal atrophy.   Electronically Signed   By: Lovey Newcomer M.D.   On: 06/02/2014 16:30   Mr Brain Wo Contrast  06/03/2014   CLINICAL DATA:  81 year old male post kyphoplasty and not following commands. History of pituitary tumor (post resection), cardiomyopathy and diastolic dysfunction. Subsequent encounter.  EXAM: MRI HEAD WITHOUT CONTRAST  TECHNIQUE: Multiplanar, multiecho pulse sequences of the brain and surrounding structures were obtained without intravenous contrast.  COMPARISON:  06/02/2014 head CT.  01/05/2009 brain MR.  FINDINGS: No acute infarct.  Postsurgical changes sphenoid sinus/ pituitary region grossly similar in appearance to 2010 MR.  If further delineation of this region were clinically desired, dedicated contrast enhanced pituitary MR would be necessary.  Global atrophy minimally asymmetric greater than on left similar to prior exam without hydrocephalus.  Tiny  blood breakdown products new superior right frontal lobe may reflect changes of hemorrhagic ischemia otherwise no evidence of intracranial hemorrhage.  Minimal linear dural thickening similar to prior exam. Findings are suggestive of post therapy changes.  Partial opacification mastoid air cells and middle ear cavities greater on the right. No obstructing lesion of the eustachian tube is noted.  Left vertebral artery is dominant. Mild narrowing distal right vertebral artery without significant change. Basilar artery is patent. Internal carotid arteries are patent as are the major dural sinuses.  IMPRESSION: No acute infarct.  Postsurgical changes pituitary/ sella region similar to prior exam as noted above.  Global atrophy without hydrocephalus without significant change.  Partial opacification mastoid air cells and middle ear cavities without obstructing lesion of the eustachian tube noted.  Mild narrowing distal right vertebral artery unchanged.   Electronically Signed   By: Genia Del M.D.   On: 06/03/2014 15:48   Mr Lumbar Spine W Wo Contrast  06/03/2014   CLINICAL DATA:  81 year old male post L4 kyphoplasty 05/21/2014. History sepsis. History of pituitary tumor post resection. Chronic thrombocytopenia.  EXAM: MRI LUMBAR SPINE WITHOUT AND WITH CONTRAST  TECHNIQUE: Multiplanar and multiecho pulse sequences of the lumbar spine were obtained without and with intravenous contrast.  CONTRAST:  15 cc MultiHance.  COMPARISON:  05/21/2014 CT.  No comparison lumbar spine MR.  FINDINGS: Last fully open disk space is labeled L5-S1. Present examination incorporates from T10-11 disc space through lower sacrum.  Conus slightly low lying mid L2 level.  Remote T12 anterior wedge compression fracture with 90% loss of height anteriorly with mild retropulsion of the posterior superior aspect of the compressed vertebra contributing to kyphosis and spinal stenosis with slight posterior cord displacement.  Recent L4 kyphoplasty  for treatment of compression fracture most notable involving the central aspect of the superior and inferior endplate with 40% loss of height centrally with minimal buckling of the posterior inferior cortical margin and minimal retropulsion slightly greater to left.  The degree of enhancement  and edema within the L4 vertebral body may represent simple postprocedure changes as the procedure was performed recently. This makes it difficult to exclude the possibility superimposed infection if of high clinical concern however, there is no evidence of epidural extension or foraminal extension of abnormality as may be seen with infection.  Edema within the paraspinal musculature including psoas and iliacus muscles may represent third spacing of fluid although spread of infection cannot be entirely excluded. No soft tissue drainable abscess.  Diffuse altered signal intensity of bone marrow may reflect anemia or result of infiltrated process.  Enlarged spleen. Bilateral renal cyst. Atherosclerotic type changes of the aorta.  L1-2:  Minimal right-sided bulge.  L2-3:  Minimal bulge greater to the right.  L3-4:  Negative.  L4-5:  As above.  L5-S1:  Minimal to mild bulge greater to the right.  IMPRESSION: Degree of enhancement and edema within the L4 vertebral body may represent simple postprocedure changes as kyphoplasty was performed recently. No definitive evidence of discitis. It is difficult to entirely exclude the possibility of superimposed infection given the vertebral body enhancement. There is however, no evidence of epidural extension or foraminal extension of abnormality as may be seen with infection.  As the patient is septic and has edema within surrounding paraspinal musculature, psoas muscles and iliacus muscle, cellulitis cannot be entirely excluded however this appearance may be related to third spacing of fluid. No drainable abscess is identified.  If there are progressive symptoms, followup imaging may prove  helpful as one would not expect progressive changes if the above the findings represented combination of simple postoperative changes and third spacing of fluid.  Markedly abnormal bone marrow may be related to anemia or infiltrative process.  Chronic compression fracture with retropulsion T12 as detailed above.  Splenomegaly.   Electronically Signed   By: Genia Del M.D.   On: 06/03/2014 17:56   Dg Chest Port 1 View  06/04/2014   CLINICAL DATA:  Pulmonary infiltrates and respiratory distress.  EXAM: PORTABLE CHEST - 1 VIEW  COMPARISON:  06/03/2014.  FINDINGS: Endotracheal tube appears slightly low, 1.7 cm above carina and could be withdrawn slightly up to 2 cm. Continued improvement aeration, particularly at the RIGHT base. Moderate airspace opacity on the LEFT with pleural effusion again noted. Orogastric tube in the stomach. Central venous catheter tip proximal RIGHT atrium.  IMPRESSION: Slight improvement aeration.  Endotracheal tube slightly low, 1.7 cm above carina.   Electronically Signed   By: Rolla Flatten M.D.   On: 06/04/2014 07:06   Dg Chest Port 1 View  06/03/2014   CLINICAL DATA:  Respiratory failure  EXAM: PORTABLE CHEST - 1 VIEW  COMPARISON:  Portable chest x-ray of Jun 02, 2014  FINDINGS: Positioning of the patient today is reverse lordotic. The lungs remain borderline hypoinflated. There is persistent bibasilar atelectasis. Dense retrocardiac alveolar opacity has improved. Small amounts of pleural fluid remain bilaterally. The cardiac silhouette is mildly enlarged. The central pulmonary vascularity is engorged.  The endotracheal tube tip lies approximately 2 cm above the crotch of the carina. The esophagogastric tube tip projects below the inferior margin of the image. The left internal jugular venous catheter tip projects over the mid to distal portion of the SVC.  IMPRESSION: There has been some improvement in appearance of the pulmonary interstitium especially at the left lung base. A  portion of this may be due to changes in positioning however. The support tubes and lines are in reasonable position.  Follow-up examination in  a straight AP projection would be useful.   Electronically Signed   By: David  Martinique M.D.   On: 06/03/2014 07:32   Dg Abd Portable 1v  06/02/2014   CLINICAL DATA:  OG tube placement  EXAM: PORTABLE ABDOMEN - 1 VIEW  COMPARISON:  None.  FINDINGS: Enteric tube tip and side port projects over the expected location of the gastric antrum.  Paucity of bowel gas without definite evidence of obstruction.  Limited visualization of the lower thorax demonstrates mild elevation the right hemidiaphragm with suspected trace left-sided effusion.  Post cement augmentation of a lower lumbar vertebral body.  Post cholecystectomy.  IMPRESSION: Enteric tube tip and side port projects over the gastric antrum   Electronically Signed   By: Sandi Mariscal M.D.   On: 06/02/2014 23:34     Assessment/Plan: Rash Sepsis ARF Chronic pituitary insufficiency/chrnic steroids Chronic thrombocytopenia hyperNatermia  Total days of antibiotics: 4 doxy, 8 vanco/cefepime    He is worsening  His MRI could show cellulitis or post-kyphoplasty changes.  His MRI of head is unrevealing.  Agree that LP could be useful however his cytopenias make this prohibitive.  Will change his cefepime to merrem.  This will get into CSF, slightly broaden coverage(add ESBL).  RMSF is (-). Ehrlichia pending ANCA is (-). ANA, cryoglobulins pending?        Bobby Rumpf Infectious Diseases (pager) [phone] [email or web address] 06/04/2014, 11:10 AM  LOS: 7 days

## 2014-06-04 NOTE — Progress Notes (Signed)
CRITICAL VALUE ALERT  Critical value received:  Cl- > 130  Date of notification:  06/04/14  Time of notification:  0641  Critical value read back:Yes.    Nurse who received alert:  Charmayne Sheer  MD notified (1st page):  [place]  Time of first page:  [phone]  MD notified (2nd page):  Time of second page:  Responding MD:  eLink  Time MD responded:  [phone]

## 2014-06-04 NOTE — Progress Notes (Signed)
Nutrition Follow-up  DOCUMENTATION CODES:  Obesity unspecified  INTERVENTION:   Tube feeding, Prostat   Continue TF via OGT with Vital High Protein at 15 ml/h (360 ml per day) with Prostat 60 ml 5 times per day to provide 1360 kcals, 181 gm protein, 300 ml free water daily.  NUTRITION DIAGNOSIS:  Inadequate oral intake related to inability to eat as evidenced by NPO status.  Ongoing.  GOAL:  Provide needs based on ASPEN/SCCM guidelines  Met.  MONITOR:  Vent status, Labs, TF tolerance, I & O's   ASSESSMENT:  Pt with recent admission for back pain s/p kyphoplasty, readmitted with SOB, back pain, hypotension and possible LLL infiltrate.   MRI of the brain and spine negative. No change in neuro status. No plans to extubate with current neuro status.  Patient is currently intubated on ventilator support MV: 15.6 L/min Temp (24hrs), Avg:98.5 F (36.9 C), Min:97.5 F (36.4 C), Max:102.2 F (39 C)  Propofol: none  Patient is currently receiving Vital High Protein via OGT at 10 ml/h (240 ml/day) with Prostat 60 ml 5 times per day to provide 1240 kcals, 171 gm protein, 201 ml free water daily. Rate being adjusted by RN to meet goal of 360 ml per day (15 ml/h).   Height:  Ht Readings from Last 1 Encounters:  05/27/2014 $RemoveB'5\' 4"'iuBqoVFr$  (1.626 m)    Weight:  Wt Readings from Last 1 Encounters:  06/04/14 221 lb 12.5 oz (100.6 kg)   05/19/2014 205 lb (92.987 kg)        Ideal Body Weight:  59 kg   BMI:  Body mass index is 38.05 kg/(m^2).  Estimated Nutritional Needs:  Kcal:  1021-1300  Protein:  >/= 183 grams   Fluid:  >1.5 L/day  Skin:  Reviewed, no issues  Diet Order:   NPO  EDUCATION NEEDS:  No education needs identified at this time   Intake/Output Summary (Last 24 hours) at 06/04/14 1528 Last data filed at 06/04/14 1400  Gross per 24 hour  Intake 2125.46 ml  Output   1080 ml  Net 1045.46 ml    Last BM:  5/24   Molli Barrows, RD, LDN,  [place] Pager [phone] After Hours Pager [phone]

## 2014-06-04 NOTE — Procedures (Signed)
History: 81 yo M with altered mental status  Sedation: fentanyl, ativan  Technique: This is a 19 channel routine scalp EEG performed at the bedside with bipolar and monopolar montages arranged in accordance to the international 10/20 system of electrode placement. One channel was dedicated to EKG recording.    Background: The background consists of diffuse irregular delta activity. There are occasional bifrontally predominant discharges with triphasic morphology with no periodicity or rhythmicity.   Photic stimulation: Physiologic driving is not performed  EEG Abnormalities: None  Clinical Interpretation: This EEG is consistent with a moderate generalized non-specific cerebral dysfunction(encephalopathy). There was no seizure or seizure predisposition recorded on this study.   Roland Rack, MD [facility] [phone]  If 7pm- 7am, please page neurology on call as listed in [place].

## 2014-06-05 ENCOUNTER — Inpatient Hospital Stay (HOSPITAL_COMMUNITY): Payer: PPO

## 2014-06-05 LAB — GLUCOSE, CAPILLARY
GLUCOSE-CAPILLARY: 121 mg/dL — AB (ref 65–99)
GLUCOSE-CAPILLARY: 124 mg/dL — AB (ref 65–99)
GLUCOSE-CAPILLARY: 75 mg/dL (ref 65–99)
GLUCOSE-CAPILLARY: 80 mg/dL (ref 65–99)
Glucose-Capillary: 82 mg/dL (ref 65–99)
Glucose-Capillary: 110 mg/dL — ABNORMAL HIGH (ref 65–99)

## 2014-06-05 LAB — CBC WITH DIFFERENTIAL/PLATELET
BASOS PCT: 1 % (ref 0–1)
Basophils Absolute: 0 10*3/uL (ref 0.0–0.1)
EOS ABS: 0.2 10*3/uL (ref 0.0–0.7)
EOS PCT: 7 % — AB (ref 0–5)
HCT: 38.2 % — ABNORMAL LOW (ref 39.0–52.0)
HEMOGLOBIN: 12.2 g/dL — AB (ref 13.0–17.0)
Lymphocytes Relative: 26 % (ref 12–46)
Lymphs Abs: 0.7 10*3/uL (ref 0.7–4.0)
MCH: 30 pg (ref 26.0–34.0)
MCHC: 31.9 g/dL (ref 30.0–36.0)
MCV: 94.1 fL (ref 78.0–100.0)
MONO ABS: 0.1 10*3/uL (ref 0.1–1.0)
MONOS PCT: 5 % (ref 3–12)
Neutro Abs: 1.6 10*3/uL — ABNORMAL LOW (ref 1.7–7.7)
Neutrophils Relative %: 61 % (ref 43–77)
Platelets: 19 10*3/uL — CL (ref 150–400)
RBC: 4.06 MIL/uL — ABNORMAL LOW (ref 4.22–5.81)
RDW: 18.3 % — ABNORMAL HIGH (ref 11.5–15.5)
WBC: 2.6 10*3/uL — ABNORMAL LOW (ref 4.0–10.5)

## 2014-06-05 LAB — COMPREHENSIVE METABOLIC PANEL
ALBUMIN: 1.7 g/dL — AB (ref 3.5–5.0)
ALK PHOS: 61 U/L (ref 38–126)
ALT: 16 U/L — ABNORMAL LOW (ref 17–63)
ANION GAP: 5 (ref 5–15)
AST: 27 U/L (ref 15–41)
BUN: 99 mg/dL — ABNORMAL HIGH (ref 6–20)
CO2: 20 mmol/L — ABNORMAL LOW (ref 22–32)
CREATININE: 2.19 mg/dL — AB (ref 0.61–1.24)
Calcium: 7.9 mg/dL — ABNORMAL LOW (ref 8.9–10.3)
Chloride: 126 mmol/L — ABNORMAL HIGH (ref 101–111)
GFR calc Af Amer: 30 mL/min — ABNORMAL LOW (ref >60)
GFR, EST NON AFRICAN AMERICAN: 26 mL/min — AB (ref >60)
Glucose, Bld: 115 mg/dL — ABNORMAL HIGH (ref 65–99)
Potassium: 3.8 mmol/L (ref 3.5–5.1)
Sodium: 151 mmol/L — ABNORMAL HIGH (ref 135–145)
Total Bilirubin: 1.6 mg/dL — ABNORMAL HIGH (ref 0.3–1.2)
Total Protein: 4.6 g/dL — ABNORMAL LOW (ref 6.5–8.1)

## 2014-06-05 LAB — POCT I-STAT 3, ART BLOOD GAS (G3+)
ACID-BASE DEFICIT: 10 mmol/L — AB (ref 0.0–2.0)
BICARBONATE: 16 meq/L — AB (ref 20.0–24.0)
O2 Saturation: 95 %
TCO2: 17 mmol/L (ref 0–100)
pCO2 arterial: 33.5 mmHg — ABNORMAL LOW (ref 35.0–45.0)
pH, Arterial: 7.287 — ABNORMAL LOW (ref 7.350–7.450)
pO2, Arterial: 83 mmHg (ref 80.0–100.0)

## 2014-06-05 LAB — HIT PANEL (HEPARIN AB + SRA)
HEPARIN INDUCED PLT AB: 0.277 {OD_unit} (ref 0.000–0.400)
Heparin Induced Plt Ab: 0.214 OD (ref 0.000–0.400)
SRA .2 IU/mL UFH Ser-aCnc: 1 % (ref 0–20)
SRA 100IU/mL UFH Ser-aCnc: 1 % (ref 0–20)
SRA 100IU/mL UFH Ser-aCnc: 1 % (ref 0–20)
SRA, LOW DOSE HEPARIN: 1 % (ref 0–20)

## 2014-06-05 LAB — PROLACTIN: Prolactin: 7.7 ng/mL (ref 4.0–15.2)

## 2014-06-05 LAB — T4, FREE: FREE T4: 0.71 ng/dL (ref 0.61–1.12)

## 2014-06-05 LAB — EHRLICHIA ANTIBODY PANEL
E chaffeensis (HGE) Ab, IgG: <1:64 {titer}
E chaffeensis (HGE) Ab, IgM: <1:20 {titer}
E. Chaffeensis (HME) IgM Titer: NEGATIVE
E.Chaffeensis (HME) IgG: NEGATIVE

## 2014-06-05 LAB — ACTH

## 2014-06-05 LAB — FOLLICLE STIMULATING HORMONE: FSH: 0.3 m[IU]/mL — ABNORMAL LOW (ref 1.5–12.4)

## 2014-06-05 LAB — LUTEINIZING HORMONE: LH: <0.2 m[IU]/mL — ABNORMAL LOW (ref 1.7–8.6)

## 2014-06-05 MED ORDER — VANCOMYCIN HCL IN DEXTROSE 750-5 MG/150ML-% IV SOLN
750.0000 mg | INTRAVENOUS | Status: DC
  Administered 2014-06-06: 750 mg via INTRAVENOUS
  Filled 2014-06-05: qty 150

## 2014-06-05 MED ORDER — SODIUM CHLORIDE 0.9 % IV SOLN
1.0000 g | Freq: Two times a day (BID) | INTRAVENOUS | Status: DC
  Administered 2014-06-05 – 2014-06-06 (×2): 1 g via INTRAVENOUS
  Filled 2014-06-05 (×4): qty 1

## 2014-06-05 MED ORDER — FENTANYL CITRATE (PF) 2500 MCG/50ML IJ SOLN
25.0000 ug/h | Status: DC
  Administered 2014-06-05: 100 ug/h via INTRAVENOUS
  Filled 2014-06-05: qty 100

## 2014-06-05 NOTE — Progress Notes (Signed)
[place] for vancomycin and meropenem Indication: rule out sepsis  Allergies  Allergen Reactions  . Penicillins Swelling and Other (See Comments)    Head and feet swelling, spent 5 days in hospital as a result of taking med    Patient Measurements: Height: 5\' 4"  (162.6 cm) Weight: 221 lb 12.5 oz (100.6 kg) IBW/kg (Calculated) : 59.2 Vital Signs: Temp: 97.7 F (36.5 C) (05/25 1204) Temp Source: Oral (05/25 1204) BP: 107/45 mmHg (05/25 1300) Pulse Rate: 65 (05/25 1300) Intake/Output from previous day: 05/24 0701 - 05/25 0700 In: 4013.6 [I.V.:2368.6; NG/GT:495; IV Piggyback:450] Out: 450 [Urine:440; Stool:10] Intake/Output from this shift: Total I/O In: 731.6 [I.V.:391.6; NG/GT:90; IV Piggyback:250] Out: 400 [Urine:400]  Labs:  Recent Labs  06/03/14 0340  06/04/14 0526 06/04/14 1851 06/05/14 0458  WBC 3.9*  --  2.4*  --  2.6*  HGB 14.1  --  14.0  --  12.2*  PLT 17*  --  19*  --  19*  CREATININE 1.39*  < > 1.33* 2.16* 2.19*  < > = values in this interval not displayed. Estimated Creatinine Clearance: 27.4 mL/min (by C-G formula based on Cr of 2.19). No results for input(s): VANCOTROUGH, VANCOPEAK, VANCORANDOM, GENTTROUGH, GENTPEAK, GENTRANDOM, TOBRATROUGH, TOBRAPEAK, TOBRARND, AMIKACINPEAK, AMIKACINTROU, AMIKACIN in the last 72 hours.   Microbiology: Recent Results (from the past 720 hour(s))  Surgical PCR screen     Status: Abnormal   Collection Time: 05/21/14  7:11 AM  Result Value Ref Range Status   MRSA, PCR NEGATIVE NEGATIVE Final   Staphylococcus aureus POSITIVE (A) NEGATIVE Final    Comment:        The Xpert SA Assay (FDA approved for NASAL specimens in patients over 89 years of age), is one component of a comprehensive surveillance program.  Test performance has been validated by [facility] for patients greater than or equal to 14 year old. It is not intended to diagnose infection nor to guide or monitor  treatment.   Blood Culture (routine x 2)     Status: None   Collection Time: 05/25/2014 10:40 AM  Result Value Ref Range Status   Specimen Description BLOOD ARM LEFT  Final   Special Requests BOTTLES DRAWN AEROBIC AND ANAEROBIC 6CCBLUE 4CCRED  Final   Culture   Final    NO GROWTH 5 DAYS Performed at [facility]    Report Status 06/03/2014 FINAL  Final  Culture, blood (routine x 2)     Status: None   Collection Time: 06/03/2014 10:55 AM  Result Value Ref Range Status   Specimen Description BLOOD ARM RIGHT  Final   Special Requests BOTTLES DRAWN AEROBIC AND ANAEROBIC 5CC  Final   Culture   Final    NO GROWTH 5 DAYS Performed at [facility]    Report Status 06/03/2014 FINAL  Final  Urine culture     Status: None   Collection Time: 05/29/2014  3:15 PM  Result Value Ref Range Status   Specimen Description URINE, RANDOM  Final   Special Requests NONE  Final   Colony Count NO GROWTH Performed at [facility]   Final   Culture NO GROWTH Performed at [facility]   Final   Report Status 05/29/2014 FINAL  Final  MRSA PCR Screening     Status: None   Collection Time: 05/31/2014  5:27 PM  Result Value Ref Range Status   MRSA by PCR NEGATIVE NEGATIVE Final    Comment:  The GeneXpert MRSA Assay (FDA approved for NASAL specimens only), is one component of a comprehensive MRSA colonization surveillance program. It is not intended to diagnose MRSA infection nor to guide or monitor treatment for MRSA infections.   Clostridium Difficile by PCR     Status: None   Collection Time: 06/01/14  5:31 AM  Result Value Ref Range Status   C difficile by pcr NEGATIVE NEGATIVE Final   81 year old male admitted with weakness, SOB, altered mental status, recent kyphoplasty. abx D#9 for r/o sepsis + LLL PNA on CXR. Covering for meningitis.  Afebrile, WBC low 2.6, PCT 4.63>7.49>4.84, LA 3.2 >1.9.   SCr bump to 2.19 (range 1.08-1.64 since 2014). CrCL 20-32ml/min.  UOP drop 0.2. Plts 19 (slight incr) -petechial rash.  *Vancomycin was re-added 5/23 as requested per ID recommendations. MD is aware of TTP association but would like to continue. Concern for possible meningitis with recent back surgery but unable to do LP due to platelets. Dosing for meningitis.   Has developed a petechial rash on thighs and back which is now all over thought to be 2/2 to thrombocytopenia.   Vanc 5/17 >> 5/20; 5/23 >>  Cefepime 5/17 >> 5/24 Merrem 5/24 >> Doxy 5/20>>   5/21 CDiff: neg  5/20 [place] spotted fever panel: negative 4/43 Ehrlichia antibody: negative 5/17 BCx x2: negative  5/17 UCx: negative Legionella/Strep UA negative  Goal of Therapy:  Proper dosing based on renal and hepatic function Vancomycin 15-20   Plan:  -Reduce Vanc 750 mg IV every 24 hours.  -Reduce Merrem 1g IV every 12 hours. -F/u clinical progression, c/s, renal function, rash progression.  Sloan Leiter, PharmD, BCPS Clinical Pharmacist [phone] 06/05/2014 2:58 PM

## 2014-06-05 NOTE — Progress Notes (Signed)
PULMONARY / CRITICAL CARE MEDICINE   Name: Scott Mooney MRN: [id number] DOB: [birth year]    ADMISSION DATE:  05/12/2014 CONSULTATION DATE:  5/187/2015  REFERRING MD :  EDP  CHIEF COMPLAINT:  SOB  INITIAL PRESENTATION: 81 y/o male with recent admission for back pain during which he received kyphoplasty. He presented again to [facility] ED 5/17 complaining of SOB and back pain. He was hypotensive with elevated lactic which did not improve with IVF. CXR showed questionable LLL infiltrate. PCCM consulted for further evaluation  STUDIES:  5/17 CT abd/pel >> Significant splenomegaly, trace acites with possible cirrhosis, trace pleural effusions, few small hypo densities in liver. Scattered bilateral renal cysts.  SIGNIFICANT EVENTS: 5/8 - 5/12 admission for back pain, kyphoplasty via IR 5/10.  5/17   Admitted for shock of unclear etiology 5/20  Weaned on PSV, pressors weaned off  5/21  Fentanyl gtt discontinued  5/22 ct head. Atrophy 5/23 mri brain / spine>>>no abscess clear, Partial opacification mastoid air cells and middle ear cavities without obstructing lesion of the eustachian tube noted 5/24 eeg>>>no seizures, enceph  SUBJECTIVE: more awake, more movements  VITAL SIGNS: Temp:  [97.6 F (36.4 C)-102.2 F (39 C)] 98.5 F (36.9 C) (05/25 0820) Pulse Rate:  [52-126] 60 (05/25 0800) Resp:  [19-42] 19 (05/25 0800) BP: (96-140)/(34-58) 116/49 mmHg (05/25 0800) SpO2:  [93 %-100 %] 100 % (05/25 0800) FiO2 (%):  [40 %] 40 % (05/25 0800) Weight:  [100.6 kg (221 lb 12.5 oz)] 100.6 kg (221 lb 12.5 oz) (05/25 0432)   HEMODYNAMICS:     VENTILATOR SETTINGS: Vent Mode:  [-] PRVC FiO2 (%):  [40 %] 40 % Set Rate:  [14 bmp] 14 bmp Vt Set:  [500 mL] 500 mL PEEP:  [5 cmH20] 5 cmH20 Plateau Pressure:  [5 NLZ76-73 cmH20] 5 cmH20   INTAKE / OUTPUT:  Intake/Output Summary (Last 24 hours) at 06/05/14 4193 Last data filed at 06/05/14 0800  Gross per 24 hour  Intake 3771.12 ml  Output    505 ml   Net 3266.12 ml    PHYSICAL EXAMINATION: General:  Frail elderly male, more wiggling HENT: ETT in place PULM: Coarse bilateral mild CV RRR, no m GI: BS+, soft, nontender MSK: edema mild Derm: petchecial, non rasied non-tender patching arms - unchanged Neuro: moving more arms and legs, min WD to pain, nonpurposeful   LABS:  CBC  Recent Labs Lab 06/03/14 0340 06/04/14 0526 06/05/14 0458  WBC 3.9* 2.4* 2.6*  HGB 14.1 14.0 12.2*  HCT 43.0 42.5 38.2*  PLT 17* 19* 19*   Coag's No results for input(s): APTT, INR in the last 168 hours. BMET  Recent Labs Lab 06/04/14 0526 06/04/14 1851 06/05/14 0458  NA 156* 152* 151*  K 3.1* 4.7 3.8  CL >130* 128* 126*  CO2 20* 18* 20*  BUN 76* 92* 99*  CREATININE 1.33* 2.16* 2.19*  GLUCOSE 78 151* 115*   Electrolytes  Recent Labs Lab 05/31/14 1224  06/01/14 0400  06/03/14 0340  06/04/14 0526 06/04/14 1851 06/05/14 0458  CALCIUM 7.7*  < >  --   < > 8.6*  < > 8.6* 7.9* 7.9*  MG 1.8  --  1.9  --  2.0  --   --   --   --   PHOS 2.8  --  2.8  --  2.6  --   --   --   --   < > = values in this interval not displayed. Sepsis Markers  Recent Labs Lab 05/29/14 1142 05/29/14 1330 05/30/14 0440 05/31/14 0330  LATICACIDVEN  --  1.9  --   --   PROCALCITON 8.48  --  7.49 4.84   ABG  Recent Labs Lab 05/29/14 1251 05/30/14 0440 06/05/14 0419  PHART 7.262* 7.284* 7.287*  PCO2ART 34.5* 31.9* 33.5*  PO2ART 87.0 116* 83.0   Liver Enzymes  Recent Labs Lab 05/31/14 1224 06/05/14 0458  AST 21 27  ALT 13* 16*  ALKPHOS 80 61  BILITOT 1.3* 1.6*  ALBUMIN 2.0* 1.7*   Cardiac Enzymes No results for input(s): TROPONINI, PROBNP in the last 168 hours.   Glucose  Recent Labs Lab 06/04/14 1150 06/04/14 1637 06/04/14 1933 06/04/14 2353 06/05/14 0407 06/05/14 0819  GLUCAP 113* 127* 126* 80 82 75    Imaging 5/24 CXR >> slight improved aeration, ett will out 1 cm  ASSESSMENT / PLAN:  PULMONARY A: Acute  hypoxemic/hypercarbic respiratory failure -improving LLL HCAP  ATX P:   abg reviewed, increase MV unlikely to sbt with acidosis and MV requirement pcxr slight improved LLL Avoid such pos balance  CARDIOVASCULAR CVL L IJ TLC 5/17 >> A:  Septic shock complicated by adrenal insufficiency - improving, off pressors 5/20 Mild to moderate MR 2014 echo See endocrine, adrenal insuff P:  ICU monitoring  Home hydrocortisone 25 mg IV daily  RENAL A:   Acute renal failure  Neg 2/3 liters last 24 hrs Hypernatremia improved hypokalemia P:   k supp Increase d5w to 100 cc/hr- to 125 cc/hr bmet in q12h Concentrate meds volume in  May need bolus 1/2 NS  GASTROINTESTINAL A:   Abdominal pain - unknown cause > CT negative Ileus better P:   Continue PPI Dc reglan  Increase TF to 20 cc/hr- go to goal  HEMATOLOGIC A:   Chronic thrombocytopenia - stable, petechial rash of uncertain etiology (underlying liver dz? Consumption spleen, sepsis?); worsened with sepsis, heparin held No clear bleeding Underlying MDS?? eosinophilia noted - infectious? P:  HIT panel >> SCD for DVT prophylaxis Monitor for bleeding Daily CBC for trend wbc Transfuse if plat less 10 , not required Consider flow cytometry   INFECTIOUS A:   Source of infection unknown. PNA > not impressive on imaging.  Could he have tick borne illness with rash? Vs petechia coalesce? P:   BCx2 5/17 >> UC 5/17 >> neg Sputum 5/17 >> C dff 5/21 >> NEG  Cefepime 5/17 >> Vanc 5/17 >> 5/20>>>5/23>>> Doxycycline 5/20 >>  RMSF (neg), Ehrlichiosis serology >> ID consult following Cant LP with plat Continued BBB coverage  ENDOCRINE A:   Adrenal insufficiency due to a pituitary tumor P:   Reduce steroids to home dose equivalent Assess pit fxn as could relate top neurostatus - prolac wnl, lh low, fsh low, tsh low, acth (P) Get t3, t4, r/o sick euthyroid CBG monitoring and SSI  NEUROLOGIC A:   Acute metabolic  encephalopathy Encephalopathy new this weekend ( ct head neg) - r/o metabolic, endocrine Prior pit mass resection Fatty liver, at risk hepatic encpeh P:   RASS goal 0 Requires fent drip for rr Assess endocrine fxn, pending further Continue empiric lactulose Correct Na further Unable to LP with plat  FAMILY  - Updates: Niece updated extensively 5/21. She is POA. Family would not want to prolong him if he has no chance of recovery but still want to have him reintubated if he failed.    DNR, comfort Friday of not improved - fam meeting held 5/24 Ccm time  30 min  Lavon Paganini. Titus Mould, MD, [place] [facility]: [facility] Pulmonary [facility] Critical Care

## 2014-06-05 NOTE — Progress Notes (Addendum)
INFECTIOUS DISEASE PROGRESS NOTE  ID: SHEMUEL HARKLEROAD is a 81 y.o. male with  Principal Problem:   Sepsis Active Problems:   GERD (gastroesophageal reflux disease)   Essential hypertension   Chronic diastolic heart failure, NYHA class 2   Hypothyroidism   Adrenal insufficiency   Compression fracture of L4 lumbar vertebra- post Kyphoplasty   Acute renal failure superimposed on stage 3 chronic kidney disease   Arterial hypotension   Acute respiratory failure with hypoxemia   Septic shock   HCAP (healthcare-associated pneumonia)   Thrombocytopenia   Acute respiratory failure   RMSF [facility] spotted fever)   Acute renal failure syndrome   Altered mental status  Subjective: On vent  Abtx:  Anti-infectives    Start     Dose/Rate Route Frequency Ordered Stop   06/04/14 2000  meropenem (MERREM) 1 g in sodium chloride 0.9 % 100 mL IVPB  Status:  Discontinued     1 g 200 mL/hr over 30 Minutes Intravenous Every 12 hours 06/04/14 1109 06/04/14 1135   06/04/14 2000  meropenem (MERREM) 2 g in sodium chloride 0.9 % 100 mL IVPB     2 g 200 mL/hr over 30 Minutes Intravenous Every 12 hours 06/04/14 1135     06/03/14 2215  vancomycin (VANCOCIN) IVPB 750 mg/150 ml premix     750 mg 150 mL/hr over 60 Minutes Intravenous Every 12 hours 06/03/14 1011     06/03/14 1015  vancomycin (VANCOCIN) 1,500 mg in sodium chloride 0.9 % 500 mL IVPB     1,500 mg 250 mL/hr over 120 Minutes Intravenous  Once 06/03/14 1011 06/03/14 1404   06/01/14 2200  doxycycline (VIBRA-TABS) tablet 100 mg     100 mg Per Tube Every 12 hours 06/01/14 1333     05/31/14 1100  doxycycline (VIBRA-TABS) tablet 100 mg  Status:  Discontinued     100 mg Oral Every 12 hours 05/31/14 0953 06/01/14 1333   05/29/14 1100  vancomycin (VANCOCIN) IVPB 1000 mg/200 mL premix  Status:  Discontinued     1,000 mg 200 mL/hr over 60 Minutes Intravenous Every 24 hours 05/22/2014 1419 05/31/14 0953   05/29/14 1000  ceFEPIme (MAXIPIME) 1 g  in dextrose 5 % 50 mL IVPB  Status:  Discontinued     1 g 100 mL/hr over 30 Minutes Intravenous Every 24 hours 05/15/2014 1419 06/04/14 1109   06/10/2014 1030  vancomycin (VANCOCIN) IVPB 1000 mg/200 mL premix  Status:  Discontinued     1,000 mg 200 mL/hr over 60 Minutes Intravenous  Once 05/12/2014 1019 05/12/2014 1028   06/01/2014 1030  ceFEPIme (MAXIPIME) 2 g in dextrose 5 % 50 mL IVPB     2 g 100 mL/hr over 30 Minutes Intravenous  Once 06/10/2014 1028 05/27/2014 1119   05/22/2014 1030  vancomycin (VANCOCIN) 1,500 mg in sodium chloride 0.9 % 500 mL IVPB     1,500 mg 250 mL/hr over 120 Minutes Intravenous  Once 06/04/2014 1028 05/16/2014 1256      Medications:  Scheduled: . antiseptic oral rinse  7 mL Mouth Rinse QID  . chlorhexidine  15 mL Mouth Rinse BID  . doxycycline  100 mg Per Tube Q12H  . feeding supplement (PRO-STAT SUGAR FREE 64)  60 mL Per Tube 5 X Daily  . feeding supplement (VITAL HIGH PROTEIN)  1,000 mL Per Tube Q24H  . fentaNYL (SUBLIMAZE) injection  50 mcg Intravenous Once  . hydrocortisone sod succinate (SOLU-CORTEF) inj  25 mg Intravenous Daily  .  insulin aspart  2-6 Units Subcutaneous 6 times per day  . lactulose  30 g Oral BID  . levothyroxine  125 mcg Oral QAC breakfast  . meropenem (MERREM) IV  2 g Intravenous Q12H  . pantoprazole sodium  40 mg Per Tube Q1200  . vancomycin  750 mg Intravenous Q12H    Objective: Vital signs in last 24 hours: Temp:  [97.6 F (36.4 C)-102.2 F (39 C)] 98.5 F (36.9 C) (05/25 0820) Pulse Rate:  [52-126] 86 (05/25 0900) Resp:  [19-42] 22 (05/25 0900) BP: (96-161)/(34-58) 161/57 mmHg (05/25 0900) SpO2:  [93 %-100 %] 100 % (05/25 0900) FiO2 (%):  [40 %] 40 % (05/25 0800) Weight:  [100.6 kg (221 lb 12.5 oz)] 100.6 kg (221 lb 12.5 oz) (05/25 0432)   General appearance: on vent Resp: rhonchi bilaterally Cardio: RRR GI: normal findings: soft, non-tender and abnormal findings:  hypoactive bowel sounds Skin: decreased rash  Lab  Results  Recent Labs  06/04/14 0526 06/04/14 1851 06/05/14 0458  WBC 2.4*  --  2.6*  HGB 14.0  --  12.2*  HCT 42.5  --  38.2*  NA 156* 152* 151*  K 3.1* 4.7 3.8  CL >130* 128* 126*  CO2 20* 18* 20*  BUN 76* 92* 99*  CREATININE 1.33* 2.16* 2.19*   Liver Panel  Recent Labs  06/05/14 0458  PROT 4.6*  ALBUMIN 1.7*  AST 27  ALT 16*  ALKPHOS 61  BILITOT 1.6*   Sedimentation Rate No results for input(s): ESRSEDRATE in the last 72 hours. C-Reactive Protein No results for input(s): CRP in the last 72 hours.  Microbiology: Recent Results (from the past 240 hour(s))  Blood Culture (routine x 2)     Status: None   Collection Time: 05/14/2014 10:40 AM  Result Value Ref Range Status   Specimen Description BLOOD ARM LEFT  Final   Special Requests BOTTLES DRAWN AEROBIC AND ANAEROBIC 6CCBLUE 4CCRED  Final   Culture   Final    NO GROWTH 5 DAYS Performed at [facility]    Report Status 06/03/2014 FINAL  Final  Culture, blood (routine x 2)     Status: None   Collection Time: 06/05/2014 10:55 AM  Result Value Ref Range Status   Specimen Description BLOOD ARM RIGHT  Final   Special Requests BOTTLES DRAWN AEROBIC AND ANAEROBIC 5CC  Final   Culture   Final    NO GROWTH 5 DAYS Performed at [facility]    Report Status 06/03/2014 FINAL  Final  Urine culture     Status: None   Collection Time: 05/16/2014  3:15 PM  Result Value Ref Range Status   Specimen Description URINE, RANDOM  Final   Special Requests NONE  Final   Colony Count NO GROWTH Performed at [facility]   Final   Culture NO GROWTH Performed at [facility]   Final   Report Status 05/29/2014 FINAL  Final  MRSA PCR Screening     Status: None   Collection Time: 06/08/2014  5:27 PM  Result Value Ref Range Status   MRSA by PCR NEGATIVE NEGATIVE Final    Comment:        The GeneXpert MRSA Assay (FDA approved for NASAL specimens only), is one component of a comprehensive MRSA  colonization surveillance program. It is not intended to diagnose MRSA infection nor to guide or monitor treatment for MRSA infections.   Clostridium Difficile by PCR     Status: None  Collection Time: 06/01/14  5:31 AM  Result Value Ref Range Status   C difficile by pcr NEGATIVE NEGATIVE Final    Studies/Results: Mr Brain Wo Contrast  06/03/2014   CLINICAL DATA:  81 year old male post kyphoplasty and not following commands. History of pituitary tumor (post resection), cardiomyopathy and diastolic dysfunction. Subsequent encounter.  EXAM: MRI HEAD WITHOUT CONTRAST  TECHNIQUE: Multiplanar, multiecho pulse sequences of the brain and surrounding structures were obtained without intravenous contrast.  COMPARISON:  06/02/2014 head CT.  01/05/2009 brain MR.  FINDINGS: No acute infarct.  Postsurgical changes sphenoid sinus/ pituitary region grossly similar in appearance to 2010 MR.  If further delineation of this region were clinically desired, dedicated contrast enhanced pituitary MR would be necessary.  Global atrophy minimally asymmetric greater than on left similar to prior exam without hydrocephalus.  Tiny blood breakdown products new superior right frontal lobe may reflect changes of hemorrhagic ischemia otherwise no evidence of intracranial hemorrhage.  Minimal linear dural thickening similar to prior exam. Findings are suggestive of post therapy changes.  Partial opacification mastoid air cells and middle ear cavities greater on the right. No obstructing lesion of the eustachian tube is noted.  Left vertebral artery is dominant. Mild narrowing distal right vertebral artery without significant change. Basilar artery is patent. Internal carotid arteries are patent as are the major dural sinuses.  IMPRESSION: No acute infarct.  Postsurgical changes pituitary/ sella region similar to prior exam as noted above.  Global atrophy without hydrocephalus without significant change.  Partial opacification  mastoid air cells and middle ear cavities without obstructing lesion of the eustachian tube noted.  Mild narrowing distal right vertebral artery unchanged.   Electronically Signed   By: Genia Del M.D.   On: 06/03/2014 15:48   Mr Lumbar Spine W Wo Contrast  06/03/2014   CLINICAL DATA:  81 year old male post L4 kyphoplasty 05/21/2014. History sepsis. History of pituitary tumor post resection. Chronic thrombocytopenia.  EXAM: MRI LUMBAR SPINE WITHOUT AND WITH CONTRAST  TECHNIQUE: Multiplanar and multiecho pulse sequences of the lumbar spine were obtained without and with intravenous contrast.  CONTRAST:  15 cc MultiHance.  COMPARISON:  05/26/2014 CT.  No comparison lumbar spine MR.  FINDINGS: Last fully open disk space is labeled L5-S1. Present examination incorporates from T10-11 disc space through lower sacrum.  Conus slightly low lying mid L2 level.  Remote T12 anterior wedge compression fracture with 90% loss of height anteriorly with mild retropulsion of the posterior superior aspect of the compressed vertebra contributing to kyphosis and spinal stenosis with slight posterior cord displacement.  Recent L4 kyphoplasty for treatment of compression fracture most notable involving the central aspect of the superior and inferior endplate with 16% loss of height centrally with minimal buckling of the posterior inferior cortical margin and minimal retropulsion slightly greater to left.  The degree of enhancement and edema within the L4 vertebral body may represent simple postprocedure changes as the procedure was performed recently. This makes it difficult to exclude the possibility superimposed infection if of high clinical concern however, there is no evidence of epidural extension or foraminal extension of abnormality as may be seen with infection.  Edema within the paraspinal musculature including psoas and iliacus muscles may represent third spacing of fluid although spread of infection cannot be entirely  excluded. No soft tissue drainable abscess.  Diffuse altered signal intensity of bone marrow may reflect anemia or result of infiltrated process.  Enlarged spleen. Bilateral renal cyst. Atherosclerotic type changes of the aorta.  L1-2:  Minimal right-sided bulge.  L2-3:  Minimal bulge greater to the right.  L3-4:  Negative.  L4-5:  As above.  L5-S1:  Minimal to mild bulge greater to the right.  IMPRESSION: Degree of enhancement and edema within the L4 vertebral body may represent simple postprocedure changes as kyphoplasty was performed recently. No definitive evidence of discitis. It is difficult to entirely exclude the possibility of superimposed infection given the vertebral body enhancement. There is however, no evidence of epidural extension or foraminal extension of abnormality as may be seen with infection.  As the patient is septic and has edema within surrounding paraspinal musculature, psoas muscles and iliacus muscle, cellulitis cannot be entirely excluded however this appearance may be related to third spacing of fluid. No drainable abscess is identified.  If there are progressive symptoms, followup imaging may prove helpful as one would not expect progressive changes if the above the findings represented combination of simple postoperative changes and third spacing of fluid.  Markedly abnormal bone marrow may be related to anemia or infiltrative process.  Chronic compression fracture with retropulsion T12 as detailed above.  Splenomegaly.   Electronically Signed   By: Genia Del M.D.   On: 06/03/2014 17:56   Dg Chest Port 1 View  06/05/2014   CLINICAL DATA:  Pneumonia.  Shortness of breath .  EXAM: PORTABLE CHEST - 1 VIEW  COMPARISON:  06/04/2014 .  FINDINGS: Endotracheal tube, NG tube, left IJ line in stable position. Tiny linear catheter noted over the right neck of questionable etiology. This may be outside of the patient. Cardiomegaly with mild pulmonary vascular prominence. Diffuse mild  interstitial prominence. These findings suggest congestive heart failure. Small bilateral effusions. Bibasilar atelectasis and/or infiltrates are present. No pneumothorax.  IMPRESSION: 1. Lines and tubes in stable position. 2. Cardiomegaly with mild pulmonary interstitial prominence. Findings slightly improved. These findings suggest improving congestive heart failure. Small pleural effusions are present. 3. Bibasilar atelectasis and/or infiltrates.   Electronically Signed   By: Marcello Moores  Register   On: 06/05/2014 07:19   Dg Chest Port 1 View  06/04/2014   CLINICAL DATA:  Pulmonary infiltrates and respiratory distress.  EXAM: PORTABLE CHEST - 1 VIEW  COMPARISON:  06/03/2014.  FINDINGS: Endotracheal tube appears slightly low, 1.7 cm above carina and could be withdrawn slightly up to 2 cm. Continued improvement aeration, particularly at the RIGHT base. Moderate airspace opacity on the LEFT with pleural effusion again noted. Orogastric tube in the stomach. Central venous catheter tip proximal RIGHT atrium.  IMPRESSION: Slight improvement aeration.  Endotracheal tube slightly low, 1.7 cm above carina.   Electronically Signed   By: Rolla Flatten M.D.   On: 06/04/2014 07:06     Assessment/Plan: Rash Sepsis ARF Chronic pituitary insufficiency/chronic steroids Chronic thrombocytopenia hyperNatermia     Total days of antibiotics: 5 doxy, 9 vanco/cefepime Cr worse over 24h, PLT unchanged.  Na improved ANA can be sent in gold top tube His RMSF and Ehrlichia are negative, will stop doxy.  DNR, CCM/family discussions.      Bobby Rumpf Infectious Diseases (pager) [phone] [email or web address] 06/05/2014, 9:52 AM  LOS: 8 days

## 2014-06-06 DIAGNOSIS — R109 Unspecified abdominal pain: Secondary | ICD-10-CM | POA: Insufficient documentation

## 2014-06-06 DIAGNOSIS — R1011 Right upper quadrant pain: Secondary | ICD-10-CM

## 2014-06-06 LAB — CBC
HCT: 40.6 % (ref 39.0–52.0)
Hemoglobin: 12.7 g/dL — ABNORMAL LOW (ref 13.0–17.0)
MCH: 29.5 pg (ref 26.0–34.0)
MCHC: 31.3 g/dL (ref 30.0–36.0)
MCV: 94.4 fL (ref 78.0–100.0)
PLATELETS: 20 10*3/uL — AB (ref 150–400)
RBC: 4.3 MIL/uL (ref 4.22–5.81)
RDW: 18.7 % — ABNORMAL HIGH (ref 11.5–15.5)
WBC: 2.8 10*3/uL — AB (ref 4.0–10.5)

## 2014-06-06 LAB — BASIC METABOLIC PANEL
Anion gap: 5 (ref 5–15)
BUN: 106 mg/dL — ABNORMAL HIGH (ref 6–20)
CALCIUM: 7.8 mg/dL — AB (ref 8.9–10.3)
CO2: 20 mmol/L — ABNORMAL LOW (ref 22–32)
Chloride: 124 mmol/L — ABNORMAL HIGH (ref 101–111)
Creatinine, Ser: 1.94 mg/dL — ABNORMAL HIGH (ref 0.61–1.24)
GFR calc Af Amer: 35 mL/min — ABNORMAL LOW (ref >60)
GFR calc non Af Amer: 30 mL/min — ABNORMAL LOW (ref >60)
GLUCOSE: 103 mg/dL — AB (ref 65–99)
Potassium: 3.4 mmol/L — ABNORMAL LOW (ref 3.5–5.1)
Sodium: 149 mmol/L — ABNORMAL HIGH (ref 135–145)

## 2014-06-06 LAB — T4: T4, Total: 2.5 ug/dL — ABNORMAL LOW (ref 4.5–12.0)

## 2014-06-06 LAB — GLUCOSE, CAPILLARY
GLUCOSE-CAPILLARY: 106 mg/dL — AB (ref 65–99)
GLUCOSE-CAPILLARY: 107 mg/dL — AB (ref 65–99)
Glucose-Capillary: 74 mg/dL (ref 65–99)
Glucose-Capillary: 145 mg/dL — ABNORMAL HIGH (ref 65–99)

## 2014-06-06 LAB — ANTINUCLEAR ANTIBODIES, IFA: ANA Ab, IFA: NEGATIVE

## 2014-06-06 LAB — T3: T3, Total: 53 ng/dL — ABNORMAL LOW (ref 71–180)

## 2014-06-06 MED ORDER — POTASSIUM CHLORIDE 20 MEQ/15ML (10%) PO SOLN
ORAL | Status: AC
  Filled 2014-06-06: qty 15

## 2014-06-06 MED ORDER — LORAZEPAM BOLUS VIA INFUSION
2.0000 mg | INTRAVENOUS | Status: DC | PRN
  Filled 2014-06-06: qty 5

## 2014-06-06 MED ORDER — LORAZEPAM 2 MG/ML IJ SOLN
5.0000 mg/h | INTRAVENOUS | Status: DC
  Administered 2014-06-06 – 2014-06-07 (×3): 5 mg/h via INTRAVENOUS
  Administered 2014-06-07: 6.5 mg/h via INTRAVENOUS
  Filled 2014-06-06 (×5): qty 25

## 2014-06-06 MED ORDER — POTASSIUM CHLORIDE 20 MEQ/15ML (10%) PO SOLN
40.0000 meq | Freq: Once | ORAL | Status: AC
  Administered 2014-06-06: 40 meq
  Filled 2014-06-06: qty 30

## 2014-06-06 MED ORDER — MORPHINE SULFATE 25 MG/ML IV SOLN
10.0000 mg/h | INTRAVENOUS | Status: DC
  Administered 2014-06-06: 10 mg/h via INTRAVENOUS
  Administered 2014-06-07: 25 mg/h via INTRAVENOUS
  Administered 2014-06-07: 18.5 mg/h via INTRAVENOUS
  Filled 2014-06-06 (×4): qty 10

## 2014-06-06 MED ORDER — MORPHINE BOLUS VIA INFUSION
5.0000 mg | INTRAVENOUS | Status: DC | PRN
  Filled 2014-06-06: qty 20

## 2014-06-06 NOTE — Progress Notes (Signed)
Patient extubated with withdrawal of life sustaining guidelines. RN and family at bedside.

## 2014-06-06 NOTE — Progress Notes (Signed)
INFECTIOUS DISEASE PROGRESS NOTE  ID: Scott Mooney is a 81 y.o. male with  Principal Problem:   Sepsis Active Problems:   GERD (gastroesophageal reflux disease)   Essential hypertension   Chronic diastolic heart failure, NYHA class 2   Hypothyroidism   Adrenal insufficiency   Compression fracture of L4 lumbar vertebra- post Kyphoplasty   Acute renal failure superimposed on stage 3 chronic kidney disease   Arterial hypotension   Acute respiratory failure with hypoxemia   Septic shock   HCAP (healthcare-associated pneumonia)   Thrombocytopenia   Acute respiratory failure   RMSF [facility] spotted fever)   Acute renal failure syndrome   Altered mental status  Subjective: On vent.   Abtx:  Anti-infectives    Start     Dose/Rate Route Frequency Ordered Stop   06/06/14 1028  vancomycin (VANCOCIN) IVPB 750 mg/150 ml premix     750 mg 150 mL/hr over 60 Minutes Intravenous Every 24 hours 06/05/14 1458     06/05/14 2000  meropenem (MERREM) 1 g in sodium chloride 0.9 % 100 mL IVPB     1 g 200 mL/hr over 30 Minutes Intravenous Every 12 hours 06/05/14 1458     06/04/14 2000  meropenem (MERREM) 1 g in sodium chloride 0.9 % 100 mL IVPB  Status:  Discontinued     1 g 200 mL/hr over 30 Minutes Intravenous Every 12 hours 06/04/14 1109 06/04/14 1135   06/04/14 2000  meropenem (MERREM) 2 g in sodium chloride 0.9 % 100 mL IVPB  Status:  Discontinued     2 g 200 mL/hr over 30 Minutes Intravenous Every 12 hours 06/04/14 1135 06/05/14 1458   06/03/14 2215  vancomycin (VANCOCIN) IVPB 750 mg/150 ml premix  Status:  Discontinued     750 mg 150 mL/hr over 60 Minutes Intravenous Every 12 hours 06/03/14 1011 06/05/14 1458   06/03/14 1015  vancomycin (VANCOCIN) 1,500 mg in sodium chloride 0.9 % 500 mL IVPB     1,500 mg 250 mL/hr over 120 Minutes Intravenous  Once 06/03/14 1011 06/03/14 1404   06/01/14 2200  doxycycline (VIBRA-TABS) tablet 100 mg  Status:  Discontinued     100 mg Per Tube  Every 12 hours 06/01/14 1333 06/05/14 1003   05/31/14 1100  doxycycline (VIBRA-TABS) tablet 100 mg  Status:  Discontinued     100 mg Oral Every 12 hours 05/31/14 0953 06/01/14 1333   05/29/14 1100  vancomycin (VANCOCIN) IVPB 1000 mg/200 mL premix  Status:  Discontinued     1,000 mg 200 mL/hr over 60 Minutes Intravenous Every 24 hours 06/06/2014 1419 05/31/14 0953   05/29/14 1000  ceFEPIme (MAXIPIME) 1 g in dextrose 5 % 50 mL IVPB  Status:  Discontinued     1 g 100 mL/hr over 30 Minutes Intravenous Every 24 hours 05/12/2014 1419 06/04/14 1109   06/06/2014 1030  vancomycin (VANCOCIN) IVPB 1000 mg/200 mL premix  Status:  Discontinued     1,000 mg 200 mL/hr over 60 Minutes Intravenous  Once 06/04/2014 1019 05/19/2014 1028   06/01/2014 1030  ceFEPIme (MAXIPIME) 2 g in dextrose 5 % 50 mL IVPB     2 g 100 mL/hr over 30 Minutes Intravenous  Once 06/04/2014 1028 05/30/2014 1119   06/02/2014 1030  vancomycin (VANCOCIN) 1,500 mg in sodium chloride 0.9 % 500 mL IVPB     1,500 mg 250 mL/hr over 120 Minutes Intravenous  Once 06/04/2014 1028 05/19/2014 1256      Medications:  Scheduled: .  antiseptic oral rinse  7 mL Mouth Rinse QID  . chlorhexidine  15 mL Mouth Rinse BID  . feeding supplement (PRO-STAT SUGAR FREE 64)  60 mL Per Tube 5 X Daily  . feeding supplement (VITAL HIGH PROTEIN)  1,000 mL Per Tube Q24H  . fentaNYL (SUBLIMAZE) injection  50 mcg Intravenous Once  . hydrocortisone sod succinate (SOLU-CORTEF) inj  25 mg Intravenous Daily  . insulin aspart  2-6 Units Subcutaneous 6 times per day  . lactulose  30 g Oral BID  . levothyroxine  125 mcg Oral QAC breakfast  . meropenem (MERREM) IV  1 g Intravenous Q12H  . pantoprazole sodium  40 mg Per Tube Q1200  . vancomycin  750 mg Intravenous Q24H    Objective: Vital signs in last 24 hours: Temp:  [97.6 F (36.4 C)-98 F (36.7 C)] 97.8 F (36.6 C) (05/26 0752) Pulse Rate:  [53-89] 84 (05/26 1000) Resp:  [17-36] 24 (05/26 1000) BP: (96-145)/(35-63) 98/57 mmHg  (05/26 1000) SpO2:  [97 %-100 %] 97 % (05/26 1000) FiO2 (%):  [40 %] 40 % (05/26 0800) Weight:  [102.3 kg (225 lb 8.5 oz)] 102.3 kg (225 lb 8.5 oz) (05/26 0500)   General appearance: no distress Resp: rhonchi bilaterally Cardio: regular rate and rhythm GI: abnormal findings:  distended and hypoactive bowel sounds Extremities: edema anasarca and coalesing petchiae on feet.   Lab Results  Recent Labs  06/05/14 0458 06/06/14 0453  WBC 2.6* 2.8*  HGB 12.2* 12.7*  HCT 38.2* 40.6  NA 151* 149*  K 3.8 3.4*  CL 126* 124*  CO2 20* 20*  BUN 99* 106*  CREATININE 2.19* 1.94*   Liver Panel  Recent Labs  06/05/14 0458  PROT 4.6*  ALBUMIN 1.7*  AST 27  ALT 16*  ALKPHOS 61  BILITOT 1.6*   Sedimentation Rate No results for input(s): ESRSEDRATE in the last 72 hours. C-Reactive Protein No results for input(s): CRP in the last 72 hours.  Microbiology: Recent Results (from the past 240 hour(s))  Blood Culture (routine x 2)     Status: None   Collection Time: 06/04/2014 10:40 AM  Result Value Ref Range Status   Specimen Description BLOOD ARM LEFT  Final   Special Requests BOTTLES DRAWN AEROBIC AND ANAEROBIC 6CCBLUE 4CCRED  Final   Culture   Final    NO GROWTH 5 DAYS Performed at [facility]    Report Status 06/03/2014 FINAL  Final  Culture, blood (routine x 2)     Status: None   Collection Time: 05/22/2014 10:55 AM  Result Value Ref Range Status   Specimen Description BLOOD ARM RIGHT  Final   Special Requests BOTTLES DRAWN AEROBIC AND ANAEROBIC 5CC  Final   Culture   Final    NO GROWTH 5 DAYS Performed at [facility]    Report Status 06/03/2014 FINAL  Final  Urine culture     Status: None   Collection Time: 05/31/2014  3:15 PM  Result Value Ref Range Status   Specimen Description URINE, RANDOM  Final   Special Requests NONE  Final   Colony Count NO GROWTH Performed at [facility]   Final   Culture NO GROWTH Performed at [facility]    Final   Report Status 05/29/2014 FINAL  Final  MRSA PCR Screening     Status: None   Collection Time: 05/27/2014  5:27 PM  Result Value Ref Range Status   MRSA by PCR NEGATIVE NEGATIVE Final  Comment:        The GeneXpert MRSA Assay (FDA approved for NASAL specimens only), is one component of a comprehensive MRSA colonization surveillance program. It is not intended to diagnose MRSA infection nor to guide or monitor treatment for MRSA infections.   Clostridium Difficile by PCR     Status: None   Collection Time: 06/01/14  5:31 AM  Result Value Ref Range Status   C difficile by pcr NEGATIVE NEGATIVE Final    Studies/Results: Dg Chest Port 1 View  06/05/2014   CLINICAL DATA:  Pneumonia.  Shortness of breath .  EXAM: PORTABLE CHEST - 1 VIEW  COMPARISON:  06/04/2014 .  FINDINGS: Endotracheal tube, NG tube, left IJ line in stable position. Tiny linear catheter noted over the right neck of questionable etiology. This may be outside of the patient. Cardiomegaly with mild pulmonary vascular prominence. Diffuse mild interstitial prominence. These findings suggest congestive heart failure. Small bilateral effusions. Bibasilar atelectasis and/or infiltrates are present. No pneumothorax.  IMPRESSION: 1. Lines and tubes in stable position. 2. Cardiomegaly with mild pulmonary interstitial prominence. Findings slightly improved. These findings suggest improving congestive heart failure. Small pleural effusions are present. 3. Bibasilar atelectasis and/or infiltrates.   Electronically Signed   By: Marcello Moores  Register   On: 06/05/2014 07:19     Assessment/Plan: Rash Sepsis ARF Chronic pituitary insufficiency/chronic steroids Chronic thrombocytopenia hyperNatermia  Total days of antibiotics: 5 doxy--. off,  9 vanco/cefepime --> off 2 Merrem  He continues to do poorly. The etiology of his syndrome remains elusive.  Cr better over 24h, PLT unchanged.  Agree  with stop vanco (this will be in his system over next 24 regardless) Family re-eval in AM         [place] (pager) [phone] [email or web address] 06/06/2014, 10:37 AM  LOS: 9 days

## 2014-06-06 NOTE — Progress Notes (Signed)
Called to the bedside by nursing due to declining blood pressures. Niece, who is the patient's POA was at the bedside. The patient is DNR. The family had initially agreed upon extubation without plans for reintubation on Friday. With his decline in blood pressures today, the niece agrees to not initiating vasopressors and allowing her uncle to pass away in piece if his blood pressures continue to decline. Will continue vent at this time per family wishes.

## 2014-06-06 NOTE — Progress Notes (Signed)
Spoke to Niece. PT continues to decline. Will transition to comfort care. Start morphine gtt w/ low dose ativan. Extubate when we feel he is comfortable later this afternoon.   Erick Colace ACNP-BC [place] Pager # [phone] OR # [phone] if no answer

## 2014-06-06 NOTE — Progress Notes (Signed)
PULMONARY / CRITICAL CARE MEDICINE   Name: Scott Mooney MRN: [id number] DOB: 09/08/1929    ADMISSION DATE:  06/04/2014 CONSULTATION DATE:  5/187/2015  REFERRING MD :  EDP  CHIEF COMPLAINT:  SOB  INITIAL PRESENTATION: 81 y/o male with recent admission for back pain during which he received kyphoplasty. He presented again to [facility] ED 5/17 complaining of SOB and back pain. He was hypotensive with elevated lactic which did not improve with IVF. CXR showed questionable LLL infiltrate. PCCM consulted for further evaluation  STUDIES:  5/17 CT abd/pel >> Significant splenomegaly, trace acites with possible cirrhosis, trace pleural effusions, few small hypo densities in liver. Scattered bilateral renal cysts.  SIGNIFICANT EVENTS: 5/8 - 5/12 admission for back pain, kyphoplasty via IR 5/10.  5/17   Admitted for shock of unclear etiology 5/20  Weaned on PSV, pressors weaned off  5/21  Fentanyl gtt discontinued  5/22 ct head. Atrophy 5/23 mri brain / spine>>>no abscess clear, Partial opacification mastoid air cells and middle ear cavities without obstructing lesion of the eustachian tube noted 5/24 eeg>>>no seizures, enceph  SUBJECTIVE: reacing for tube, not following commands  VITAL SIGNS: Temp:  [97.6 F (36.4 C)-98.5 F (36.9 C)] 98 F (36.7 C) (05/26 0421) Pulse Rate:  [53-86] 56 (05/26 0400) Resp:  [17-27] 18 (05/26 0400) BP: (96-161)/(35-57) 118/46 mmHg (05/26 0400) SpO2:  [97 %-100 %] 100 % (05/26 0400) FiO2 (%):  [40 %] 40 % (05/26 0432)   HEMODYNAMICS:     VENTILATOR SETTINGS: Vent Mode:  [-] PRVC FiO2 (%):  [40 %] 40 % Set Rate:  [14 bmp-22 bmp] 22 bmp Vt Set:  [500 mL] 500 mL PEEP:  [5 cmH20] 5 cmH20 Plateau Pressure:  [13 cmH20-16 cmH20] 16 cmH20   INTAKE / OUTPUT:  Intake/Output Summary (Last 24 hours) at 06/06/14 3254 Last data filed at 06/06/14 0400  Gross per 24 hour  Intake 3813.53 ml  Output   2875 ml  Net 938.53 ml    PHYSICAL EXAMINATION: General:   Frail elderly male, calm in bed HENT: ETT in place PULM: Coarse, reduced bases CV  s1 s2 rrr GI: BS+, soft, nontender MSK: edema diffuse gen Derm: petchecial, non rasied non-tender patching arms - improved Neuro: moving more arms and legs, did try to reach for tube, may have wiggled toes for me   LABS:  CBC  Recent Labs Lab 06/04/14 0526 06/05/14 0458 06/06/14 0453  WBC 2.4* 2.6* 2.8*  HGB 14.0 12.2* 12.7*  HCT 42.5 38.2* 40.6  PLT 19* 19* 20*   Coag's No results for input(s): APTT, INR in the last 168 hours. BMET  Recent Labs Lab 06/04/14 1851 06/05/14 0458 06/06/14 0453  NA 152* 151* 149*  K 4.7 3.8 3.4*  CL 128* 126* 124*  CO2 18* 20* 20*  BUN 92* 99* 106*  CREATININE 2.16* 2.19* 1.94*  GLUCOSE 151* 115* 103*   Electrolytes  Recent Labs Lab 05/31/14 1224  06/01/14 0400  06/03/14 0340  06/04/14 1851 06/05/14 0458 06/06/14 0453  CALCIUM 7.7*  < >  --   < > 8.6*  < > 7.9* 7.9* 7.8*  MG 1.8  --  1.9  --  2.0  --   --   --   --   PHOS 2.8  --  2.8  --  2.6  --   --   --   --   < > = values in this interval not displayed. Sepsis Markers  Recent Labs Lab 05/31/14  0330  PROCALCITON 4.84   ABG  Recent Labs Lab 06/05/14 0419  PHART 7.287*  PCO2ART 33.5*  PO2ART 83.0   Liver Enzymes  Recent Labs Lab 05/31/14 1224 06/05/14 0458  AST 21 27  ALT 13* 16*  ALKPHOS 80 61  BILITOT 1.3* 1.6*  ALBUMIN 2.0* 1.7*   Cardiac Enzymes No results for input(s): TROPONINI, PROBNP in the last 168 hours.   Glucose  Recent Labs Lab 06/05/14 0819 06/05/14 1200 06/05/14 1608 06/05/14 2007 06/05/14 2346 06/06/14 0423  GLUCAP 75 121* 124* 110* 107* 106*    Imaging 5/24 CXR >> slight improved aeration, ett will out 1 cm  ASSESSMENT / PLAN:  PULMONARY A: Acute hypoxemic/hypercarbic respiratory failure -improving LLL HCAP  ATX P:   No new abg, keep rate 22 with prior abg sbt attempt, cpap 5 ps 5, goal 2 hrs likely with such lack of progress  and his prior wishes should consider dc vent Friday - will d/w family pcx rin am with pos balance from free water  CARDIOVASCULAR CVL L IJ TLC 5/17 >> A:  Septic shock complicated by adrenal insufficiency - improving, off pressors 5/20 Mild to moderate MR 2014 echo See endocrine, adrenal insuff P:  ICU monitoring  Home hydrocortisone 25 mg IV daily  RENAL A:   Acute renal failure  Neg 2/3 liters last 24 hrs Hypernatremia improved hypokalemia P:   k supp Increase d5w to 100 cc/hr- to 125 cc/hr- keep this rate, may need to 150 given na slow to respond bmet in q12h supp k   GASTROINTESTINAL A:   Abdominal pain - unknown cause > CT negative Ileus better P:   Continue PPI  TF at goal  HEMATOLOGIC A:   Chronic thrombocytopenia - stable, petechial rash of uncertain etiology (underlying liver dz? Consumption spleen, sepsis?); worsened with sepsis, heparin held No clear bleeding Underlying MDS?? eosinophilia noted - infectious? P:  HIT panel >>>neg SCD for DVT prophylaxis Monitor for bleeding Daily CBC  Transfuse if plat less 10 , not required  INFECTIOUS A:   Source of infection unknown. PNA > not impressive on imaging.  Could he have tick borne illness with rash? Vs petechia coalesce? P:   BCx2 5/17 >> UC 5/17 >> neg Sputum 5/17 >> C dff 5/21 >> NEG  Cefepime 5/17 >> Vanc 5/17 >> 5/20>>>5/23>>> Doxycycline 5/20 >>5/25  RMSF (neg), Ehrlichiosis serology >> abx per ID Consider dc vanc Continued BBB coverage  ENDOCRINE A:   Adrenal insufficiency due to a pituitary tumor R/o central hypothyroidism vs sick euthyroid varient P:   Reduce steroids to home dose equivalent Assess pit fxn as could relate top neurostatus - prolac wnl, lh low, fsh low, tsh low, acth (P) CBG monitoring and SSI  NEUROLOGIC A:   Acute metabolic encephalopathy Encephalopathy new this weekend ( ct head neg) - r/o metabolic, endocrine Prior pit mass resection Fatty liver, at risk  hepatic encpeh P:   RASS goal 0 Requires fent drip for rr Continue empiric lactulose, limited benefit noted, may dc in 24 hrs Correct Na further to goal 145, increase d5w  FAMILY  - Updates: Niece updated extensively 5/21. She is POA. Family would not want to prolong him if he has no chance of recovery but still want to have him reintubated if he failed.    DNR, comfort Friday of not improved - fam meeting held 5/24 likley to dc vent in am   Ccm time 30 min  Lavon Paganini. Titus Mould, MD, [place]  Pgr: [facility]

## 2014-06-06 NOTE — Progress Notes (Signed)
Chaplain responded to a page . Chaplain entered the room and the PT was on life support. In the room were his nephew,niece,and  granddaughter. The chaplain offered assistance regarding water or other beverage. The chaplain prayed, read scripture and practiced the ministry of presence. They sang songs. The family embraced the chaplain as he was called away.    06/06/14 1700  Clinical Encounter Type  Visited With Patient and family together  Visit Type Patient actively dying  Referral From Physician  Spiritual Encounters  Spiritual Needs Prayer;Emotional;Grief support  Stress Factors  Family Stress Factors Loss

## 2014-06-07 ENCOUNTER — Inpatient Hospital Stay (HOSPITAL_COMMUNITY): Payer: PPO

## 2014-06-07 DIAGNOSIS — Z66 Do not resuscitate: Secondary | ICD-10-CM

## 2014-06-07 DIAGNOSIS — Z515 Encounter for palliative care: Secondary | ICD-10-CM

## 2014-06-07 MED ORDER — GLYCOPYRROLATE 0.2 MG/ML IJ SOLN
0.1000 mg | Freq: Four times a day (QID) | INTRAMUSCULAR | Status: DC
  Administered 2014-06-07 (×4): 0.1 mg via INTRAVENOUS
  Filled 2014-06-07 (×5): qty 0.5

## 2014-06-07 MED ORDER — ATROPINE SULFATE 1 % OP SOLN
2.0000 [drp] | Freq: Three times a day (TID) | OPHTHALMIC | Status: DC | PRN
  Administered 2014-06-07: 2 [drp] via SUBLINGUAL
  Filled 2014-06-07: qty 5

## 2014-06-08 NOTE — Progress Notes (Signed)
Wasted Morphine 12cc and wasted Ativan 80cc in sink, witness by Dian Queen.

## 2014-06-10 LAB — CRYOGLOBULIN

## 2014-06-11 ENCOUNTER — Telehealth: Payer: Self-pay

## 2014-06-11 NOTE — Telephone Encounter (Signed)
On 06/11/2014 I received a death certificate from [facility]. This patient is a patient of Doctor Titus Mould. This certificate is for burial. The certificate will be taken to [place] at [facility] tomorrow morning for signature. On 20-Jul-2014 I received the death certificate back completed from Doctor Titus Mould. I called the funeral home to let them know the death certificate is ready for pickup.

## 2014-06-12 NOTE — Progress Notes (Signed)
Wasted 12cc of IV Ativan in sink. Witnessed by Gerrit Friends, RN

## 2014-06-12 NOTE — Progress Notes (Signed)
Nutrition Brief Note  Chart reviewed. Pt now transitioning to comfort care.  No further nutrition interventions warranted at this time.  Please re-consult as needed.   Shiza Thelen A. Reiss Mowrey, RD, LDN, CDE Pager: [phone] After hours Pager: [phone]  

## 2014-06-12 NOTE — Progress Notes (Signed)
PULMONARY / CRITICAL CARE MEDICINE   Name: Scott Mooney MRN: [id number] DOB: 27-Mar-1929    ADMISSION DATE:  06/01/2014 CONSULTATION DATE:  5/187/2015  REFERRING MD :  EDP  CHIEF COMPLAINT:  SOB  INITIAL PRESENTATION: 81 y/o male with recent admission for back pain during which he received kyphoplasty. He presented again to [facility] ED 5/17 complaining of SOB and back pain. He was hypotensive with elevated lactic which did not improve with IVF. CXR showed questionable LLL infiltrate. PCCM consulted for further evaluation  STUDIES:  5/17 CT abd/pel >> Significant splenomegaly, trace acites with possible cirrhosis, trace pleural effusions, few small hypo densities in liver. Scattered bilateral renal cysts.  SIGNIFICANT EVENTS: 5/8 - 5/12 admission for back pain, kyphoplasty via IR 5/10.  5/17   Admitted for shock of unclear etiology 5/20  Weaned on PSV, pressors weaned off  5/21  Fentanyl gtt discontinued  5/22 ct head. Atrophy 5/23 mri brain / spine>>>no abscess clear, Partial opacification mastoid air cells and middle ear cavities without obstructing lesion of the eustachian tube noted 5/24 eeg>>>no seizures, enceph 5/25- declined, shock, comfort started  SUBJECTIVE: appears comfortable, secretions increased  VITAL SIGNS: Temp:  [97.9 F (36.6 C)] 97.9 F (36.6 C) (05/26 1136) Pulse Rate:  [25-92] 74 (05/27 0903) Resp:  [7-32] 10 (05/27 0903) BP: (57-95)/(31-61) 57/33 mmHg (05/27 0903) SpO2:  [88 %-99 %] 93 % (05/27 0903) FiO2 (%):  [40 %] 40 % (05/26 1200)   HEMODYNAMICS:     VENTILATOR SETTINGS: Vent Mode:  [-] PRVC FiO2 (%):  [40 %] 40 % Set Rate:  [22 bmp] 22 bmp Vt Set:  [500 mL] 500 mL PEEP:  [5 cmH20] 5 cmH20   INTAKE / OUTPUT:  Intake/Output Summary (Last 24 hours) at 06/30/14 1047 Last data filed at 06-30-2014 0900  Gross per 24 hour  Intake 1015.79 ml  Output    170 ml  Net 845.79 ml    PHYSICAL EXAMINATION: General:  Frail elderly male, rr 18-22, some  secretions HENT:  PULM: ronchi bilateral CV  s1 s2 rrr GI: BS+, soft, nontender MSK: edema increased Derm: petchecial, unchanged Neuro: lethargic, unresponsive   LABS:  CBC  Recent Labs Lab 06/04/14 0526 06/05/14 0458 06/06/14 0453  WBC 2.4* 2.6* 2.8*  HGB 14.0 12.2* 12.7*  HCT 42.5 38.2* 40.6  PLT 19* 19* 20*   Coag's No results for input(s): APTT, INR in the last 168 hours. BMET  Recent Labs Lab 06/04/14 1851 06/05/14 0458 06/06/14 0453  NA 152* 151* 149*  K 4.7 3.8 3.4*  CL 128* 126* 124*  CO2 18* 20* 20*  BUN 92* 99* 106*  CREATININE 2.16* 2.19* 1.94*  GLUCOSE 151* 115* 103*   Electrolytes  Recent Labs Lab 05/31/14 1224  06/01/14 0400  06/03/14 0340  06/04/14 1851 06/05/14 0458 06/06/14 0453  CALCIUM 7.7*  < >  --   < > 8.6*  < > 7.9* 7.9* 7.8*  MG 1.8  --  1.9  --  2.0  --   --   --   --   PHOS 2.8  --  2.8  --  2.6  --   --   --   --   < > = values in this interval not displayed. Sepsis Markers No results for input(s): LATICACIDVEN, PROCALCITON, O2SATVEN in the last 168 hours. ABG  Recent Labs Lab 06/05/14 0419  PHART 7.287*  PCO2ART 33.5*  PO2ART 83.0   Liver Enzymes  Recent Labs Lab 05/31/14  1224 06/05/14 0458  AST 21 27  ALT 13* 16*  ALKPHOS 80 61  BILITOT 1.3* 1.6*  ALBUMIN 2.0* 1.7*   Cardiac Enzymes No results for input(s): TROPONINI, PROBNP in the last 168 hours.   Glucose  Recent Labs Lab 06/05/14 1608 06/05/14 2007 06/05/14 2346 06/06/14 0423 06/06/14 0750 06/06/14 1135  GLUCAP 124* 110* 107* 106* 74 145*    ASSESSMENT / PLAN:  NEUROLOGIC A:   Acute metabolic encephalopathy Encephalopathy new this weekend ( ct head neg) - r/o metabolic, endocrine Prior pit mass resection Fatty liver, at risk hepatic encpeh Comfort care initiated 5/27 P:   Morphine to rr 12-18 and pain control Ativan , would NOT increase this No O2, will prolong suffering Suction ok if needed To floor Family updated   Lavon Paganini. Titus Mould, MD, [place] Pgr: [place] [facility]

## 2014-06-12 NOTE — Progress Notes (Signed)
eLink Physician-Brief Progress Note Patient Name: Scott Mooney DOB: 01/19/1929 MRN: [id number]   Date of Service  07-04-2014  HPI/Events of Note  Patient is full comfort care having difficulty with secretions.  eICU Interventions  Robinul 0.1 mg qid IV     Intervention Category Minor Interventions: Routine modifications to care plan (e.g. PRN medications for pain, fever)  DETERDING,ELIZABETH 07/04/14, 3:32 AM

## 2014-06-12 NOTE — Clinical Social Work Note (Signed)
CSW received consult for patient who is on comfort care.  CSW will continue to follow patient in case he needs to go to residential hospice.  Jones Broom. Rico, MSW, [place] 06-09-14 4:26 PM

## 2014-06-12 DEATH — deceased

## 2014-06-13 NOTE — Discharge Summary (Signed)
NAME:  Scott Mooney, Scott Mooney NO.:  [id number]  MEDICAL RECORD NO.:  [id number]  LOCATION:  [id number]                        FACILITY:  [facility]  PHYSICIAN:  Raylene Miyamoto, MD DATE OF BIRTH:  [birth year]  DATE OF ADMISSION:  06/03/2014 DATE OF DISCHARGE:  06-27-14                              DISCHARGE SUMMARY   HOSPITAL COURSE:  This is an 81 year old male with a recent admission approximately 3 weeks ago prior to admission for undergoing a kyphoplasty who presented again to [facility] on May 17th complaining of shortness of breath, back pain; he was hypotensive; elevated lactic acids __________ showed a questionable left lower lobe __________ consistent with left __________ pneumonia.  __________ was consulted for further evaluation and admission.  Studies done throughout the hospitalization include a CT scan of the abdomen and pelvis with significant splenomegaly, trace ascites, possible cirrhosis, trace pleural effusions __________ small hypodensities in the liver and scattered bilateral renal __________.  Significant events included __________ Interventional Radiology on May 10th.  On May 17th, as above, the patient was admitted with shock of unclear etiology and respiratory failure.  On May 20th, the patient remained to be having weaning with pressure support weaning pressures were weaned off.  On May 21st, the patient __________ discontinued.  On May 22nd, the patient had a CT scan of the head which revealed atrophy.  On May 23rd, secondary to lack of progress neurologically, the patient had an MRI of the __________ spine done to rule out abscess or etiology for poor neurologic __________ abscess on the back.  There was a partial opacification of mastoid air cells and middle ear cavities without obstructing lesions of the eustachian tubes.  An EEG was also performed on May 24 with no seizures __________ encephalopathy.  On May 25th, the patient  declined __________, and comfort care was started at the prior discussions of having the patient to be made a DNR based on prior wishes and with his lack of progress.  He was also treated empirically for hepatic encephalopathy given CT scan finding for possible liver disease, but the patient just seemed to not make significant improvement.  There was some more alertness and agitation level, he never followed commands; and discussions were held with the medical power of attorney, niece who was crystal clear that the patient would not want to have heroic measures continued under these circumstances __________ comfort care was provided, the patient expired.  To note, the patient had a continuous evaluation by Infectious Disease, making active recommendations; and there was a concern at one point secondary to a particular rash the patient clearly had a tick-borne illness, although I think __________ consistent with that.  He had some mild __________ moderate thrombocytopenia down to the low __________ consistent with the petechial rash __________ likely was the cause of the particular rash.  DIAGNOSES UPON DEATH: 1. Septic shock, unclear etiology, unclear source. 2. Rule out left lower lobe pneumonia. 3. Acute respiratory failure. 4. Multifactorial encephalopathy. 5. Status post kyphoplasty. 6. DNR status requiring comfort care.     Raylene Miyamoto, MD     DJF/MEDQ  D:  06/12/2014  T:  06/13/2014  Job:  [id number]
